# Patient Record
Sex: Male | Born: 1975 | Race: White | Hispanic: No | Marital: Married | Smoking: Never smoker
Health system: Southern US, Community
[De-identification: ages and names within clinical notes are randomized; demographics above are authoritative.]

## PROBLEM LIST (undated history)

## (undated) DIAGNOSIS — N200 Calculus of kidney: Secondary | ICD-10-CM

## (undated) DIAGNOSIS — F419 Anxiety disorder, unspecified: Secondary | ICD-10-CM

## (undated) DIAGNOSIS — G43909 Migraine, unspecified, not intractable, without status migrainosus: Secondary | ICD-10-CM

## (undated) DIAGNOSIS — G8929 Other chronic pain: Secondary | ICD-10-CM

## (undated) DIAGNOSIS — IMO0002 Reserved for concepts with insufficient information to code with codable children: Secondary | ICD-10-CM

## (undated) DIAGNOSIS — K589 Irritable bowel syndrome without diarrhea: Secondary | ICD-10-CM

## (undated) DIAGNOSIS — K219 Gastro-esophageal reflux disease without esophagitis: Secondary | ICD-10-CM

## (undated) DIAGNOSIS — G4730 Sleep apnea, unspecified: Secondary | ICD-10-CM

## (undated) DIAGNOSIS — M542 Cervicalgia: Secondary | ICD-10-CM

## (undated) DIAGNOSIS — M5020 Other cervical disc displacement, unspecified cervical region: Secondary | ICD-10-CM

## (undated) DIAGNOSIS — B029 Zoster without complications: Secondary | ICD-10-CM

## (undated) DIAGNOSIS — T7840XA Allergy, unspecified, initial encounter: Secondary | ICD-10-CM

## (undated) DIAGNOSIS — I10 Essential (primary) hypertension: Secondary | ICD-10-CM

## (undated) HISTORY — PX: WISDOM TOOTH EXTRACTION: SHX21

## (undated) HISTORY — DX: Irritable bowel syndrome, unspecified: K58.9

## (undated) HISTORY — DX: Allergy, unspecified, initial encounter: T78.40XA

## (undated) HISTORY — PX: SPINE SURGERY: SHX786

## (undated) HISTORY — DX: Essential (primary) hypertension: I10

## (undated) HISTORY — DX: Gastro-esophageal reflux disease without esophagitis: K21.9

## (undated) HISTORY — DX: Other chronic pain: G89.29

## (undated) HISTORY — PX: LITHOTRIPSY: SUR834

## (undated) HISTORY — DX: Anxiety disorder, unspecified: F41.9

---

## 1999-11-18 ENCOUNTER — Ambulatory Visit (HOSPITAL_COMMUNITY): Admission: RE | Admit: 1999-11-18 | Discharge: 1999-11-18 | Payer: Self-pay | Admitting: Urology

## 1999-11-18 ENCOUNTER — Encounter: Payer: Self-pay | Admitting: Urology

## 2001-12-10 ENCOUNTER — Encounter: Payer: Self-pay | Admitting: Emergency Medicine

## 2001-12-10 ENCOUNTER — Encounter: Admission: RE | Admit: 2001-12-10 | Discharge: 2001-12-10 | Payer: Self-pay | Admitting: Emergency Medicine

## 2003-07-31 ENCOUNTER — Ambulatory Visit (HOSPITAL_COMMUNITY): Admission: RE | Admit: 2003-07-31 | Discharge: 2003-07-31 | Payer: Self-pay | Admitting: Family Medicine

## 2004-06-20 ENCOUNTER — Inpatient Hospital Stay (HOSPITAL_COMMUNITY): Admission: EM | Admit: 2004-06-20 | Discharge: 2004-06-23 | Payer: Self-pay | Admitting: Emergency Medicine

## 2004-06-25 ENCOUNTER — Ambulatory Visit (HOSPITAL_COMMUNITY): Admission: RE | Admit: 2004-06-25 | Discharge: 2004-06-25 | Payer: Self-pay | Admitting: Urology

## 2004-08-19 ENCOUNTER — Ambulatory Visit (HOSPITAL_COMMUNITY): Admission: RE | Admit: 2004-08-19 | Discharge: 2004-08-19 | Payer: Self-pay | Admitting: Urology

## 2006-02-05 ENCOUNTER — Emergency Department (HOSPITAL_COMMUNITY): Admission: EM | Admit: 2006-02-05 | Discharge: 2006-02-06 | Payer: Self-pay | Admitting: Emergency Medicine

## 2006-02-05 ENCOUNTER — Emergency Department (HOSPITAL_COMMUNITY): Admission: EM | Admit: 2006-02-05 | Discharge: 2006-02-05 | Payer: Self-pay | Admitting: Emergency Medicine

## 2006-06-11 ENCOUNTER — Encounter: Admission: RE | Admit: 2006-06-11 | Discharge: 2006-06-11 | Payer: Self-pay | Admitting: Neurosurgery

## 2006-07-17 ENCOUNTER — Encounter
Admission: RE | Admit: 2006-07-17 | Discharge: 2006-07-17 | Payer: Self-pay | Admitting: Physical Medicine and Rehabilitation

## 2007-06-17 ENCOUNTER — Emergency Department (HOSPITAL_COMMUNITY): Admission: EM | Admit: 2007-06-17 | Discharge: 2007-06-17 | Payer: Self-pay | Admitting: Emergency Medicine

## 2007-10-13 ENCOUNTER — Encounter: Admission: RE | Admit: 2007-10-13 | Discharge: 2007-10-13 | Payer: Self-pay | Admitting: Family Medicine

## 2007-11-14 ENCOUNTER — Encounter: Admission: RE | Admit: 2007-11-14 | Discharge: 2007-11-14 | Payer: Self-pay | Admitting: Neurological Surgery

## 2007-11-19 ENCOUNTER — Emergency Department (HOSPITAL_COMMUNITY): Admission: EM | Admit: 2007-11-19 | Discharge: 2007-11-19 | Payer: Self-pay | Admitting: Emergency Medicine

## 2007-11-20 ENCOUNTER — Ambulatory Visit (HOSPITAL_COMMUNITY): Admission: RE | Admit: 2007-11-20 | Discharge: 2007-11-20 | Payer: Self-pay | Admitting: Emergency Medicine

## 2007-11-20 ENCOUNTER — Encounter (INDEPENDENT_AMBULATORY_CARE_PROVIDER_SITE_OTHER): Payer: Self-pay | Admitting: Emergency Medicine

## 2007-11-20 ENCOUNTER — Ambulatory Visit: Payer: Self-pay | Admitting: Surgery

## 2008-07-24 ENCOUNTER — Encounter: Payer: Self-pay | Admitting: Emergency Medicine

## 2008-07-24 ENCOUNTER — Observation Stay (HOSPITAL_COMMUNITY): Admission: EM | Admit: 2008-07-24 | Discharge: 2008-07-25 | Payer: Self-pay | Admitting: *Deleted

## 2008-08-22 ENCOUNTER — Encounter: Admission: RE | Admit: 2008-08-22 | Discharge: 2008-08-22 | Payer: Self-pay | Admitting: Emergency Medicine

## 2009-06-03 ENCOUNTER — Emergency Department (HOSPITAL_COMMUNITY): Admission: EM | Admit: 2009-06-03 | Discharge: 2009-06-03 | Payer: Self-pay | Admitting: Emergency Medicine

## 2009-06-08 ENCOUNTER — Ambulatory Visit (HOSPITAL_COMMUNITY): Admission: RE | Admit: 2009-06-08 | Discharge: 2009-06-08 | Payer: Self-pay | Admitting: Urology

## 2009-06-18 ENCOUNTER — Emergency Department (HOSPITAL_COMMUNITY): Admission: EM | Admit: 2009-06-18 | Discharge: 2009-06-18 | Payer: Self-pay | Admitting: Emergency Medicine

## 2010-02-08 ENCOUNTER — Encounter: Payer: Self-pay | Admitting: Emergency Medicine

## 2010-02-08 ENCOUNTER — Encounter: Payer: Self-pay | Admitting: Family Medicine

## 2010-03-02 ENCOUNTER — Other Ambulatory Visit (HOSPITAL_COMMUNITY): Payer: Self-pay | Admitting: Family Medicine

## 2010-03-02 DIAGNOSIS — K819 Cholecystitis, unspecified: Secondary | ICD-10-CM

## 2010-03-02 DIAGNOSIS — R509 Fever, unspecified: Secondary | ICD-10-CM

## 2010-03-03 ENCOUNTER — Ambulatory Visit (HOSPITAL_COMMUNITY)
Admission: RE | Admit: 2010-03-03 | Discharge: 2010-03-03 | Disposition: A | Discharge status: Home / Self Care | Payer: BC Managed Care – PPO | Source: Ambulatory Visit | Attending: Family Medicine | Admitting: Family Medicine

## 2010-03-03 DIAGNOSIS — K819 Cholecystitis, unspecified: Secondary | ICD-10-CM

## 2010-03-03 DIAGNOSIS — R109 Unspecified abdominal pain: Secondary | ICD-10-CM | POA: Insufficient documentation

## 2010-03-03 DIAGNOSIS — R112 Nausea with vomiting, unspecified: Secondary | ICD-10-CM | POA: Insufficient documentation

## 2010-03-03 DIAGNOSIS — R509 Fever, unspecified: Secondary | ICD-10-CM | POA: Insufficient documentation

## 2010-03-04 ENCOUNTER — Emergency Department (HOSPITAL_COMMUNITY)
Admission: EM | Admit: 2010-03-04 | Discharge: 2010-03-05 | Disposition: A | Discharge status: Home / Self Care | Payer: BC Managed Care – PPO | Attending: Emergency Medicine | Admitting: Emergency Medicine

## 2010-03-04 ENCOUNTER — Emergency Department (HOSPITAL_COMMUNITY): Payer: BC Managed Care – PPO

## 2010-03-04 ENCOUNTER — Encounter (HOSPITAL_COMMUNITY): Payer: Self-pay | Admitting: Radiology

## 2010-03-04 DIAGNOSIS — R197 Diarrhea, unspecified: Secondary | ICD-10-CM | POA: Insufficient documentation

## 2010-03-04 DIAGNOSIS — R10819 Abdominal tenderness, unspecified site: Secondary | ICD-10-CM | POA: Insufficient documentation

## 2010-03-04 DIAGNOSIS — Z79899 Other long term (current) drug therapy: Secondary | ICD-10-CM | POA: Insufficient documentation

## 2010-03-04 DIAGNOSIS — R319 Hematuria, unspecified: Secondary | ICD-10-CM | POA: Insufficient documentation

## 2010-03-04 DIAGNOSIS — R509 Fever, unspecified: Secondary | ICD-10-CM | POA: Insufficient documentation

## 2010-03-04 DIAGNOSIS — IMO0002 Reserved for concepts with insufficient information to code with codable children: Secondary | ICD-10-CM | POA: Insufficient documentation

## 2010-03-04 DIAGNOSIS — R112 Nausea with vomiting, unspecified: Secondary | ICD-10-CM | POA: Insufficient documentation

## 2010-03-04 DIAGNOSIS — R1013 Epigastric pain: Secondary | ICD-10-CM | POA: Insufficient documentation

## 2010-03-04 HISTORY — DX: Calculus of kidney: N20.0

## 2010-03-04 HISTORY — DX: Reserved for concepts with insufficient information to code with codable children: IMO0002

## 2010-03-04 HISTORY — DX: Zoster without complications: B02.9

## 2010-03-04 LAB — COMPREHENSIVE METABOLIC PANEL
ALT: 36 U/L (ref 0–53)
AST: 37 U/L (ref 0–37)
Albumin: 4.5 g/dL (ref 3.5–5.2)
Alkaline Phosphatase: 87 U/L (ref 39–117)
BUN: 12 mg/dL (ref 6–23)
CO2: 30 mEq/L (ref 19–32)
Calcium: 9.9 mg/dL (ref 8.4–10.5)
Chloride: 99 mEq/L (ref 96–112)
Creatinine, Ser: 0.86 mg/dL (ref 0.4–1.5)
GFR calc Af Amer: >60 mL/min (ref >60)
GFR calc non Af Amer: >60 mL/min (ref >60)
Glucose, Bld: 118 mg/dL — ABNORMAL HIGH (ref 70–99)
Potassium: 4.3 mEq/L (ref 3.5–5.1)
Sodium: 136 mEq/L (ref 135–145)
Total Bilirubin: 0.6 mg/dL (ref 0.3–1.2)
Total Protein: 8.7 g/dL — ABNORMAL HIGH (ref 6.0–8.3)

## 2010-03-04 LAB — CBC
HCT: 41.4 % (ref 39.0–52.0)
Hemoglobin: 14.4 g/dL (ref 13.0–17.0)
MCH: 30.2 pg (ref 26.0–34.0)
MCHC: 34.8 g/dL (ref 30.0–36.0)
MCV: 86.8 fL (ref 78.0–100.0)
Platelets: 278 10*3/uL (ref 150–400)
RBC: 4.77 MIL/uL (ref 4.22–5.81)
RDW: 13.6 % (ref 11.5–15.5)
WBC: 10 10*3/uL (ref 4.0–10.5)

## 2010-03-04 LAB — URINALYSIS, ROUTINE W REFLEX MICROSCOPIC
Bilirubin Urine: NEGATIVE
Hgb urine dipstick: NEGATIVE
Ketones, ur: NEGATIVE mg/dL
Nitrite: NEGATIVE
Protein, ur: NEGATIVE mg/dL
Specific Gravity, Urine: 1.024 (ref 1.005–1.030)
Urine Glucose, Fasting: NEGATIVE mg/dL
Urobilinogen, UA: 0.2 mg/dL (ref 0.0–1.0)
pH: 8 (ref 5.0–8.0)

## 2010-03-04 LAB — DIFFERENTIAL
Basophils Absolute: 0 10*3/uL (ref 0.0–0.1)
Basophils Relative: 0 % (ref 0–1)
Eosinophils Absolute: 0.1 10*3/uL (ref 0.0–0.7)
Eosinophils Relative: 1 % (ref 0–5)
Lymphocytes Relative: 13 % (ref 12–46)
Lymphs Abs: 1.3 10*3/uL (ref 0.7–4.0)
Monocytes Absolute: 0.4 10*3/uL (ref 0.1–1.0)
Monocytes Relative: 4 % (ref 3–12)
Neutro Abs: 8.2 10*3/uL — ABNORMAL HIGH (ref 1.7–7.7)
Neutrophils Relative %: 82 % — ABNORMAL HIGH (ref 43–77)

## 2010-03-04 LAB — URINE MICROSCOPIC-ADD ON

## 2010-03-04 MED ORDER — IOHEXOL 300 MG/ML  SOLN
100.0000 mL | Freq: Once | INTRAMUSCULAR | Status: AC | PRN
  Administered 2010-03-04: 100 mL via INTRAVENOUS

## 2010-03-05 LAB — OCCULT BLOOD, POC DEVICE: Fecal Occult Bld: NEGATIVE

## 2010-04-05 LAB — URINALYSIS, ROUTINE W REFLEX MICROSCOPIC
Bilirubin Urine: NEGATIVE
Glucose, UA: NEGATIVE mg/dL
Ketones, ur: NEGATIVE mg/dL
Leukocytes, UA: NEGATIVE
Nitrite: NEGATIVE
Protein, ur: NEGATIVE mg/dL
Specific Gravity, Urine: 1.013 (ref 1.005–1.030)
Urobilinogen, UA: 0.2 mg/dL (ref 0.0–1.0)
pH: 6 (ref 5.0–8.0)

## 2010-04-05 LAB — CBC
HCT: 41.5 % (ref 39.0–52.0)
Hemoglobin: 14.4 g/dL (ref 13.0–17.0)
MCHC: 34.8 g/dL (ref 30.0–36.0)
MCV: 89.3 fL (ref 78.0–100.0)
Platelets: 289 10*3/uL (ref 150–400)
RBC: 4.65 MIL/uL (ref 4.22–5.81)
RDW: 13.1 % (ref 11.5–15.5)
WBC: 10.1 10*3/uL (ref 4.0–10.5)

## 2010-04-05 LAB — DIFFERENTIAL
Basophils Absolute: 0 10*3/uL (ref 0.0–0.1)
Basophils Relative: 0 % (ref 0–1)
Eosinophils Absolute: 0 10*3/uL (ref 0.0–0.7)
Eosinophils Relative: 0 % (ref 0–5)
Lymphocytes Relative: 15 % (ref 12–46)
Lymphs Abs: 1.5 10*3/uL (ref 0.7–4.0)
Monocytes Absolute: 0.5 10*3/uL (ref 0.1–1.0)
Monocytes Relative: 5 % (ref 3–12)
Neutro Abs: 8.1 10*3/uL — ABNORMAL HIGH (ref 1.7–7.7)
Neutrophils Relative %: 80 % — ABNORMAL HIGH (ref 43–77)

## 2010-04-05 LAB — POCT I-STAT, CHEM 8
BUN: 17 mg/dL (ref 6–23)
Calcium, Ion: 1.22 mmol/L (ref 1.12–1.32)
Chloride: 106 mEq/L (ref 96–112)
Creatinine, Ser: 0.7 mg/dL (ref 0.4–1.5)
Glucose, Bld: 101 mg/dL — ABNORMAL HIGH (ref 70–99)
HCT: 43 % (ref 39.0–52.0)
Hemoglobin: 14.6 g/dL (ref 13.0–17.0)
Potassium: 3.8 mEq/L (ref 3.5–5.1)
Sodium: 139 mEq/L (ref 135–145)
TCO2: 26 mmol/L (ref 0–100)

## 2010-04-05 LAB — URINE MICROSCOPIC-ADD ON

## 2010-04-25 LAB — COMPREHENSIVE METABOLIC PANEL
ALT: 23 U/L (ref 0–53)
ALT: 26 U/L (ref 0–53)
AST: 29 U/L (ref 0–37)
AST: 27 U/L (ref 0–37)
Albumin: 4 g/dL (ref 3.5–5.2)
Albumin: 4.3 g/dL (ref 3.5–5.2)
Alkaline Phosphatase: 84 U/L (ref 39–117)
Alkaline Phosphatase: 84 U/L (ref 39–117)
BUN: 12 mg/dL (ref 6–23)
BUN: 13 mg/dL (ref 6–23)
CO2: 25 mEq/L (ref 19–32)
CO2: 27 mEq/L (ref 19–32)
Calcium: 9.3 mg/dL (ref 8.4–10.5)
Calcium: 10.1 mg/dL (ref 8.4–10.5)
Chloride: 106 mEq/L (ref 96–112)
Chloride: 106 mEq/L (ref 96–112)
Creatinine, Ser: 0.94 mg/dL (ref 0.4–1.5)
Creatinine, Ser: 0.93 mg/dL (ref 0.4–1.5)
GFR calc Af Amer: >60 mL/min (ref >60)
GFR calc Af Amer: >60 mL/min (ref >60)
GFR calc non Af Amer: >60 mL/min (ref >60)
GFR calc non Af Amer: >60 mL/min (ref >60)
Glucose, Bld: 102 mg/dL — ABNORMAL HIGH (ref 70–99)
Glucose, Bld: 97 mg/dL (ref 70–99)
Potassium: 3.5 mEq/L (ref 3.5–5.1)
Potassium: 3.5 mEq/L (ref 3.5–5.1)
Sodium: 140 mEq/L (ref 135–145)
Sodium: 138 mEq/L (ref 135–145)
Total Bilirubin: 0.7 mg/dL (ref 0.3–1.2)
Total Bilirubin: 0.3 mg/dL (ref 0.3–1.2)
Total Protein: 7.4 g/dL (ref 6.0–8.3)
Total Protein: 7.4 g/dL (ref 6.0–8.3)

## 2010-04-25 LAB — DIFFERENTIAL
Basophils Absolute: 0 10*3/uL (ref 0.0–0.1)
Basophils Absolute: 0 10*3/uL (ref 0.0–0.1)
Basophils Relative: 0 % (ref 0–1)
Basophils Relative: 1 % (ref 0–1)
Eosinophils Absolute: 0 10*3/uL (ref 0.0–0.7)
Eosinophils Absolute: 0.1 10*3/uL (ref 0.0–0.7)
Eosinophils Relative: 1 % (ref 0–5)
Eosinophils Relative: 2 % (ref 0–5)
Lymphocytes Relative: 32 % (ref 12–46)
Lymphocytes Relative: 36 % (ref 12–46)
Lymphs Abs: 1.8 10*3/uL (ref 0.7–4.0)
Lymphs Abs: 2.9 10*3/uL (ref 0.7–4.0)
Monocytes Absolute: 0.4 10*3/uL (ref 0.1–1.0)
Monocytes Absolute: 0.7 10*3/uL (ref 0.1–1.0)
Monocytes Relative: 8 % (ref 3–12)
Monocytes Relative: 9 % (ref 3–12)
Neutro Abs: 3.4 10*3/uL (ref 1.7–7.7)
Neutro Abs: 4.2 10*3/uL (ref 1.7–7.7)
Neutrophils Relative %: 59 % (ref 43–77)
Neutrophils Relative %: 53 % (ref 43–77)

## 2010-04-25 LAB — CARDIAC PANEL(CRET KIN+CKTOT+MB+TROPI)
CK, MB: 0.8 ng/mL (ref 0.3–4.0)
CK, MB: 0.9 ng/mL (ref 0.3–4.0)
Relative Index: INVALID (ref 0.0–2.5)
Relative Index: INVALID (ref 0.0–2.5)
Total CK: 64 U/L (ref 7–232)
Total CK: 68 U/L (ref 7–232)
Troponin I: 0.01 ng/mL (ref 0.00–0.06)
Troponin I: 0.02 ng/mL (ref 0.00–0.06)

## 2010-04-25 LAB — CBC
HCT: 37.7 % — ABNORMAL LOW (ref 39.0–52.0)
HCT: 41.3 % (ref 39.0–52.0)
Hemoglobin: 13.3 g/dL (ref 13.0–17.0)
Hemoglobin: 15 g/dL (ref 13.0–17.0)
MCHC: 35.1 g/dL (ref 30.0–36.0)
MCHC: 36.3 g/dL — ABNORMAL HIGH (ref 30.0–36.0)
MCV: 88.1 fL (ref 78.0–100.0)
MCV: 87.2 fL (ref 78.0–100.0)
Platelets: 231 10*3/uL (ref 150–400)
Platelets: 276 10*3/uL (ref 150–400)
RBC: 4.28 MIL/uL (ref 4.22–5.81)
RBC: 4.74 MIL/uL (ref 4.22–5.81)
RDW: 12.5 % (ref 11.5–15.5)
RDW: 12.1 % (ref 11.5–15.5)
WBC: 5.8 10*3/uL (ref 4.0–10.5)
WBC: 7.9 10*3/uL (ref 4.0–10.5)

## 2010-04-25 LAB — BASIC METABOLIC PANEL
BUN: 11 mg/dL (ref 6–23)
CO2: 24 mEq/L (ref 19–32)
Calcium: 8.6 mg/dL (ref 8.4–10.5)
Chloride: 106 mEq/L (ref 96–112)
Creatinine, Ser: 0.84 mg/dL (ref 0.4–1.5)
GFR calc Af Amer: >60 mL/min (ref >60)
GFR calc non Af Amer: >60 mL/min (ref >60)
Glucose, Bld: 99 mg/dL (ref 70–99)
Potassium: 3.7 mEq/L (ref 3.5–5.1)
Sodium: 136 mEq/L (ref 135–145)

## 2010-04-25 LAB — LIPID PANEL
Cholesterol: 158 mg/dL (ref 0–200)
HDL: 27 mg/dL — ABNORMAL LOW (ref >39)
LDL Cholesterol: 70 mg/dL (ref 0–99)
Total CHOL/HDL Ratio: 5.9 RATIO
Triglycerides: 306 mg/dL — ABNORMAL HIGH (ref <150)
VLDL: 61 mg/dL — ABNORMAL HIGH (ref 0–40)

## 2010-04-25 LAB — POCT CARDIAC MARKERS
CKMB, poc: <1 ng/mL — ABNORMAL LOW (ref 1.0–8.0)
CKMB, poc: <1 ng/mL — ABNORMAL LOW (ref 1.0–8.0)
Myoglobin, poc: 37.7 ng/mL (ref 12–200)
Myoglobin, poc: 60.9 ng/mL (ref 12–200)
Troponin i, poc: <0.05 ng/mL (ref 0.00–0.09)
Troponin i, poc: <0.05 ng/mL (ref 0.00–0.09)

## 2010-04-25 LAB — LIPASE, BLOOD: Lipase: 18 U/L (ref 11–59)

## 2010-04-25 LAB — RAPID URINE DRUG SCREEN, HOSP PERFORMED
Amphetamines: NOT DETECTED
Barbiturates: NOT DETECTED
Benzodiazepines: NOT DETECTED
Cocaine: NOT DETECTED
Opiates: POSITIVE — AB
Tetrahydrocannabinol: NOT DETECTED

## 2010-04-25 LAB — HEPATIC FUNCTION PANEL
ALT: 28 U/L (ref 0–53)
AST: 25 U/L (ref 0–37)
Albumin: 3.6 g/dL (ref 3.5–5.2)
Alkaline Phosphatase: 69 U/L (ref 39–117)
Bilirubin, Direct: <0.1 mg/dL (ref 0.0–0.3)
Total Bilirubin: 0.7 mg/dL (ref 0.3–1.2)
Total Protein: 6.5 g/dL (ref 6.0–8.3)

## 2010-04-25 LAB — TROPONIN I: Troponin I: 0.01 ng/mL (ref 0.00–0.06)

## 2010-04-25 LAB — PROTIME-INR
INR: 1 (ref 0.00–1.49)
INR: 1 (ref 0.00–1.49)
Prothrombin Time: 13.6 seconds (ref 11.6–15.2)
Prothrombin Time: 12.9 seconds (ref 11.6–15.2)

## 2010-04-25 LAB — HEMOCCULT GUIAC POC 1CARD (OFFICE): Fecal Occult Bld: NEGATIVE

## 2010-04-25 LAB — CK TOTAL AND CKMB (NOT AT ARMC)
CK, MB: 0.8 ng/mL (ref 0.3–4.0)
Relative Index: INVALID (ref 0.0–2.5)
Total CK: 61 U/L (ref 7–232)

## 2010-04-25 LAB — APTT: aPTT: 29 seconds (ref 24–37)

## 2010-06-01 NOTE — H&P (Signed)
[name] NO.:  [id number]   MEDICAL RECORD NO.:  [id number]          PATIENT TYPE:  INP   LOCATION:  1824                         FACILITY:  [facility]   PHYSICIAN:  Hollice Espy, M.D.DATE OF BIRTH:  September 20, 1975   DATE OF ADMISSION:  07/24/2008  DATE OF DISCHARGE:                              HISTORY & PHYSICAL   __________   PRIMARY CARE PHYSICIAN:  [facility], [place].   CHIEF COMPLAINT:  Chest/epigastric discomfort.   HISTORY OF PRESENT ILLNESS:  The patient is a 35 year old Caucasian male  with no significant past medical history.  The patient was resting at  home and experienced the sudden onset of epigastric/chest discomfort at  11:30 p.m.  The patient thought it was indigestion and upon his wife's  recommendations he took 3 pills of antacids but with no relief.  He had  the pain for about 2 hours with no relief and so he went to the [facility].  He describes the pain to be as a pressure-like sensation  with no radiation.  It was 8/10 in intensity.  There are no specific  aggravating or alleviating factors.  The pain was associated with  shortness of breath and he had one episode of emesis which was non-  bloody.  The pain is not pleuritic in nature and its is reproducible  with palpation in the epigastric region.  He had a similar episode 1-  week ago after sexual intercourse.  He reports no decrease in exercise  tolerance lately.  He denies having any PND or orthopnea.  There is no  fever or chills.  He denies having any cough.  There is no flu-like  prodrome in the last 2 weeks.  He has not traveled long distance.   As part of the workup, he had an EKG done at [facility] and the  ER physician was concerned that he had ST elevation in the inferior  leads and, therefore a code STEMI was called and the patient was  transferred to [facility] for further management.  The ER physician  reviewed the EKG at [facility] and it was not consistent with  an ST elevation myocardial infarction.  A repeat EKG was done again at  the [facility] and it did not reveal any ST elevation.  A set  of cardiac enzymes were obtained at [facility] and these were  within normal limits.  The patient is being evaluated for cardiac  ischemia with a stress test.   REVIEW OF SYSTEMS:  A complete review of systems was done which included  general, head, eyes, ears, nose, throat, cardiovascular, respiratory,  GI, GU, endocrine, musculoskeletal, skin, neurologic and psychiatric and  all are within normal limits other than what is mentioned above.   PAST MEDICAL HISTORY:  1. Migraine headaches.  2. Renal stones.  3. Shingles.   ALLERGIES:  ERYTHROMYCIN TO WHICH HE IS INTOLERANT.   MEDICATIONS AT HOME:  1. Topamax 100 mg p.o. nightly.  2. Reglan p.r.n. for nausea.   SOCIAL HISTORY:  He works at [employer].  He lives with his  spouse.  There is no history of tobacco abuse, alcohol or illicit drug  use.   FAMILY HISTORY:  Hypertension.   PHYSICAL EXAMINATION:  VITAL SIGNS:  Blood pressure 111/66, heart rate  86, respirations 22, O2 saturations 98% on room air.  GENERAL APPEARANCE:  Not in acute distress.  Alert, awake and oriented  x3.  Afebrile.  HEENT:  Normocephalic, atraumatic.  Pupils are equal and reactive to  light and accommodation.  Extraocular movements are intact.  Mucous  membranes are moist.  NECK:  Supple.  No JVD, lymphadenopathy or carotid bruits.  CARDIOVASCULAR:  Regular rhythm, rate is normal.  No murmurs, rubs or  gallops.  LUNGS:  Clear to auscultation bilaterally.  ABDOMEN:  Soft.  There is mild tenderness in the epigastric region, but  there is no rebound or guarding.  There is no hepatosplenomegaly or  palpable masses.  No ascites.  EXTREMITIES:  No clubbing, cyanosis or edema.  NEUROLOGIC:  Grossly nonfocal.   LABS AND STUDIES:  WBC 7900, hemoglobin 15,  hematocrit 41.3.  Platelets  276.  INR 1.0, protime 12.9, PTT 29.  Sodium 140, potassium 3.5,  chloride 106, bicarb 25, BUN 12, creatinine 0.9, blood glucose 102.  CK-  MB less than 1, troponin less than 0.05, CK 37.7.  Fecal occult blood  test negative.   ASSESSMENT/PLAN:  1. Chest pain.  Very atypical for cardiac ischemia.  The patient has      no risk factors.  The patient had a similar episode after physical      exertion in the past.  There are nonspecific EKG changes.  The EKG      reveals a normal sinus rhythm at a rate of 80 beats per minute.      The axis is normal.  The PR and QT intervals are within normal      limits.  Nonspecific ST segment changes in lead 2 and AVF.  There      is T-wave inversion in lead V1.  There is, however, no old EKG to      compare.  A chest x-ray obtained at [facility] revealed no      acute abnormalities.  There was no mediastinal widening noted.  2. Acute abdominal series noted minimal bronchitic changes but no      acute abnormal findings.  Very unlikely that is due to cardiac      ischemia.  Given the presentation after physical exertion and      atypical EKG findings, we will evaluate with a cardiac stress test.      If the stress test is positive, we will request a cardiology      consult for a possible left heart catheterization.  We will be      cycling cardiac enzymes and repeat an EKG later in the day.  The      patient will be started on modified ACS protocol.  He was started      on a heparin drip at [facility], which was stopped.  He      was also given a loading dose of __________600 mg p.o.  He also      received aspirin 325 mg p.o.  3. The lipase was within normal limits.  It does not like pancreatic      pathology.  A guaiac was done which was negative.  We will consider  a right upper quadrant ultrasound to evaluate for gallbladder      pathology if symptoms persist.  4. Deep venous thrombosis prophylaxis  with heparin.  5. Fluid/Electrolyte/Nutrition.  We will replace his electrolytes as      needed.  We will place __________.  He will      be made n.p.o. until after the stress test.  6. This patient will be admitted to the cardiac floor with telemetry.      He will be discharged home if the stress test is within normal      limits.      Hollice Espy, M.D.  Electronically Signed     SKK/MEDQ  D:  07/24/2008  T:  07/24/2008  Job:  [id number]

## 2010-06-04 NOTE — H&P (Signed)
[name], [name]                ACCOUNT NO.:  [id number]   MEDICAL RECORD NO.:  [id number]          PATIENT TYPE:  INP   LOCATION:  A325                          FACILITY:  [facility]   PHYSICIAN:  Dennie Maizes, M.D.   DATE OF BIRTH:  [birth year]   DATE OF ADMISSION:  06/20/2004  DATE OF DISCHARGE:  LH                                HISTORY & PHYSICAL   CHIEF COMPLAINT:  Severe flank pain radiating to the front, nausea and  vomiting for four days.   HISTORY OF PRESENT ILLNESS:  This 35 year old male has a past history of  recurrent urolithiasis.  He complains of having sudden onset of severe flank  pain radiating to the front for the past four days.  He has nausea and  vomiting.  He has been evaluated by Dr. Daneil Dolin of the KUB area  revealed a small left upper ureteral calculus.  The patient's pain was not  adequately controlled with p.o. analgesics.  He came to the emergency room  at [facility] for further evaluation.  CT scan of the abdomen and  pelvis without contrast was done.  This revealed a 4 mm size left upper  ureteral calculus with obstruction and left hydronephrosis.  The patient's  pain was not adequately controlled in the emergency room.  He has been  admitted to the hospital for pain control and further treatment.   The patient did not have any fevers, chills, swallowing difficulty or gross  hematuria.   PAST MEDICAL HISTORY:  1.  History of recurrent urolithiasis, status post ESWL of renal calculus,      2001.  2.  History of migraine headaches.   CURRENT MEDICATIONS:  1.  Imipramine 25 mg p.o. q.h.s.  2.  Imitrex 100 mg p.o. p.r.n. for migraine headaches.  3.  Metoclopramide 10 mg p.o. t.i.d.   ALLERGIES:  ERYTHROMYCIN.   PHYSICAL EXAMINATION:  GENERAL:  The patient is comfortable after receiving  parenteral narcotics.  HEENT:  Normal.  NECK:  No masses.  LUNGS:  Clear to auscultation.  HEART:  Regular rate and rhythm.  No murmurs.  ABDOMEN:   Soft.  No palpable flank mass.  Moderate left costovertebral  tenderness was noted.  No suprapubic tenderness or bladder distention.  GENITALIA:  Penis and testes are normal.   ADMISSION LABORATORIES:  Urinalysis:  Blood moderate, nitrites negative,  leukocyte esterase negative, WBCs 3-6 per high power field, RBCs 11-20 per  high power field.  CBC:  WBC 8.9, hemoglobin 14.8, hematocrit 41.3.  Renal  function is normal.  BUN 11, creatinine 0.8.  Electrolytes within normal  range.   IMPRESSION:  1.  Left upper ureteral calculus with obstruction.  2.  Left renal colic.  3.  Left hydronephrosis.   PLAN:  1.  Admit the patient to the hospital.  2.  Parenteral narcotics.  3.  IV fluids.  4.  Strain all urine for stones.  5.  Discussed with the patient about management options.       SK/MEDQ  D:  06/21/2004  T:  06/21/2004  Job:  [id number] 

## 2010-06-04 NOTE — Discharge Summary (Signed)
[name], WALKUP                ACCOUNT NO.:  [id number]   MEDICAL RECORD NO.:  [id number]          PATIENT TYPE:  INP   LOCATION:  A325                          FACILITY:  [facility]   PHYSICIAN:  Dennie Maizes, M.D.   DATE OF BIRTH:  1975-05-10   DATE OF ADMISSION:  06/20/2004  DATE OF DISCHARGE:  06/07/2006LH                                 DISCHARGE SUMMARY   FINAL DIAGNOSES:  1.  Left upper ureteral calculus with obstruction.  2.  Left renal colic.  3.  Left hydronephrosis.   OPERATIVE PROCEDURE:  Extracorporeal shock wave lithotripsy of the left  ureteral calculus, done on June 23, 2004.   COMPLICATIONS:  None.   DISCHARGE SUMMARY:  This 35 year old male has a past history of recurrent  ureterolithiasis.  He experienced sudden onset of severe left flank pain  radiating to the front for four days.  He had nausea and vomiting associated  with the pain.  He was evaluated by Dr. Etta Grandchild at [facility].  X-ray with a  KUB revealed a small left upper ureteral calculus.  The pain was not  completely controlled with p.o. analgesics.  He came to the emergency room  at [facility] for further evaluation.  A noncontrast CT scan of the  abdomen and pelvis revealed a 4 mm sized left upper ureteral calculus with  obstruction and left hydronephrosis.  The patient's pain was not completely  controlled in the emergency room.  He was admitted to the hospital for pain  control and further treatment.  The patient denied having any fever, chills,  voiding difficulty, or gross hematuria.   PAST MEDICAL HISTORY:  1.  Recurrent ureterolithiasis, status post ESWL of the left renal calculus      in 2001.  2.  History of migraine headaches.   MEDICATIONS:  1.  Imipramine 25 mg 1 p.o. q.h.s.  2.  Imitrex 100 mg p.o. p.r.n. migraine headaches.  3.  Metoclopramide 10 mg p.o. t.i.d.   ALLERGIES:  ERYTHROMYCIN.   PHYSICAL EXAMINATION:  GENERAL:  Patient was comfortable while receiving  parenteral narcotics.  HEENT:  Normal.  NECK:  No masses.  LUNGS:  Clear to auscultation.  HEART:  Regular rate and rhythm with no murmurs.  ABDOMEN:  Soft.  No palpable frank mass.  Moderate left costovertebral angle  tenderness was noted.  No suprapubic tenderness or bladder distention.  GENITOURINARY:  Penis and testes are normal.   ADMISSION LABS:  Urinalysis:  Blood moderate.  Nitrites negative.  Leukocyte  esterase negative.  WBCs 3-6 per high-powered field.  RBCs 11-20 per high-  powered field.  CBC:  WBC 8.9, hemoglobin 14.8, hematocrit 41.3.  Renal  functions normal with BUN of 11, creatinine 0.8.  Electrolytes within normal  range.   HOSPITAL COURSE:  The patient was admitted to the hospital and treated with  IV fluids and parenteral narcotics.  He was unable to pass the stone, and he  had severe, persistent pain.  I discussed with the patient about management  options.  He wanted to wait for about 24-48 hours.  An  x-ray with a KUB was  done on June 22, 2004.  His left upper ureteral calculus was noted.  The  patient decided to undergo ESWL of the left upper ureteral calculus, which  was done with IV sedation on June 23, 2004.  Postoperatively, the patient had  good pain relief.  He was voiding well.  He was discharged and sent home on  June 23, 2004.  His discharge medications were Cipro 500 mg 1 p.o. b.i.d. x7  days and Tylenol w/Codeine #3 1 p.o. q.8h. p.r.n. pain #20.  He will be  reviewed in the office on June 25, 2004, at which time a post-ESWL KUB will  be done.  Patient is advised to call me for any fever, chills, voiding  difficulty, or gross hematuria.   The condition of the patient at the time of discharge was stable.       SK/MEDQ  D:  07/16/2004  T:  07/16/2004  Job:  [id number]

## 2010-07-18 ENCOUNTER — Emergency Department (HOSPITAL_COMMUNITY): Payer: 59

## 2010-07-18 ENCOUNTER — Inpatient Hospital Stay (HOSPITAL_COMMUNITY)
Admission: EM | Admit: 2010-07-18 | Discharge: 2010-07-21 | DRG: 392 | Disposition: A | Discharge status: Home / Self Care | Payer: 59 | Attending: General Surgery | Admitting: General Surgery

## 2010-07-18 DIAGNOSIS — R1031 Right lower quadrant pain: Principal | ICD-10-CM | POA: Diagnosis present

## 2010-07-18 DIAGNOSIS — Z87442 Personal history of urinary calculi: Secondary | ICD-10-CM

## 2010-07-18 DIAGNOSIS — G43909 Migraine, unspecified, not intractable, without status migrainosus: Secondary | ICD-10-CM | POA: Diagnosis present

## 2010-07-18 DIAGNOSIS — M51379 Other intervertebral disc degeneration, lumbosacral region without mention of lumbar back pain or lower extremity pain: Secondary | ICD-10-CM | POA: Diagnosis present

## 2010-07-18 DIAGNOSIS — M5137 Other intervertebral disc degeneration, lumbosacral region: Secondary | ICD-10-CM | POA: Diagnosis present

## 2010-07-18 LAB — COMPREHENSIVE METABOLIC PANEL
ALT: 26 U/L (ref 0–53)
AST: 21 U/L (ref 0–37)
Albumin: 4.1 g/dL (ref 3.5–5.2)
Alkaline Phosphatase: 95 U/L (ref 39–117)
BUN: 21 mg/dL (ref 6–23)
CO2: 27 mEq/L (ref 19–32)
Calcium: 10 mg/dL (ref 8.4–10.5)
Chloride: 98 mEq/L (ref 96–112)
Creatinine, Ser: 0.88 mg/dL (ref 0.50–1.35)
GFR calc Af Amer: >60 mL/min (ref >60)
GFR calc non Af Amer: >60 mL/min (ref >60)
Glucose, Bld: 93 mg/dL (ref 70–99)
Potassium: 3.8 mEq/L (ref 3.5–5.1)
Sodium: 137 mEq/L (ref 135–145)
Total Bilirubin: 0.6 mg/dL (ref 0.3–1.2)
Total Protein: 8.3 g/dL (ref 6.0–8.3)

## 2010-07-18 LAB — DIFFERENTIAL
Basophils Absolute: 0 10*3/uL (ref 0.0–0.1)
Basophils Relative: 0 % (ref 0–1)
Eosinophils Absolute: 0.1 10*3/uL (ref 0.0–0.7)
Eosinophils Relative: 1 % (ref 0–5)
Lymphocytes Relative: 20 % (ref 12–46)
Lymphs Abs: 1.6 10*3/uL (ref 0.7–4.0)
Monocytes Absolute: 0.4 10*3/uL (ref 0.1–1.0)
Monocytes Relative: 5 % (ref 3–12)
Neutro Abs: 5.8 10*3/uL (ref 1.7–7.7)
Neutrophils Relative %: 74 % (ref 43–77)

## 2010-07-18 LAB — URINALYSIS, ROUTINE W REFLEX MICROSCOPIC
Bilirubin Urine: NEGATIVE
Glucose, UA: NEGATIVE mg/dL
Hgb urine dipstick: NEGATIVE
Ketones, ur: NEGATIVE mg/dL
Leukocytes, UA: NEGATIVE
Nitrite: NEGATIVE
Protein, ur: NEGATIVE mg/dL
Specific Gravity, Urine: 1.01 (ref 1.005–1.030)
Urobilinogen, UA: 0.2 mg/dL (ref 0.0–1.0)
pH: 7 (ref 5.0–8.0)

## 2010-07-18 LAB — CBC
HCT: 45.1 % (ref 39.0–52.0)
Hemoglobin: 15.1 g/dL (ref 13.0–17.0)
MCH: 29.3 pg (ref 26.0–34.0)
MCHC: 33.5 g/dL (ref 30.0–36.0)
MCV: 87.6 fL (ref 78.0–100.0)
Platelets: 266 10*3/uL (ref 150–400)
RBC: 5.15 MIL/uL (ref 4.22–5.81)
RDW: 13.2 % (ref 11.5–15.5)
WBC: 7.9 10*3/uL (ref 4.0–10.5)

## 2010-07-18 MED ORDER — IOHEXOL 300 MG/ML  SOLN
100.0000 mL | Freq: Once | INTRAMUSCULAR | Status: AC | PRN
  Administered 2010-07-18: 100 mL via INTRAVENOUS

## 2010-07-19 ENCOUNTER — Observation Stay (HOSPITAL_COMMUNITY): Payer: 59

## 2010-07-19 LAB — CBC
HCT: 43.4 % (ref 39.0–52.0)
Hemoglobin: 14.6 g/dL (ref 13.0–17.0)
MCH: 29.8 pg (ref 26.0–34.0)
MCHC: 33.6 g/dL (ref 30.0–36.0)
MCV: 88.6 fL (ref 78.0–100.0)
Platelets: 246 10*3/uL (ref 150–400)
RBC: 4.9 MIL/uL (ref 4.22–5.81)
RDW: 13.3 % (ref 11.5–15.5)
WBC: 7.6 10*3/uL (ref 4.0–10.5)

## 2010-07-19 LAB — BASIC METABOLIC PANEL
BUN: 15 mg/dL (ref 6–23)
CO2: 30 mEq/L (ref 19–32)
Calcium: 10.1 mg/dL (ref 8.4–10.5)
Chloride: 97 mEq/L (ref 96–112)
Creatinine, Ser: 0.72 mg/dL (ref 0.50–1.35)
GFR calc Af Amer: >60 mL/min (ref >60)
GFR calc non Af Amer: >60 mL/min (ref >60)
Glucose, Bld: 97 mg/dL (ref 70–99)
Potassium: 3.8 mEq/L (ref 3.5–5.1)
Sodium: 135 mEq/L (ref 135–145)

## 2010-07-19 LAB — DIFFERENTIAL
Basophils Absolute: 0 10*3/uL (ref 0.0–0.1)
Basophils Relative: 0 % (ref 0–1)
Eosinophils Absolute: 0 10*3/uL (ref 0.0–0.7)
Eosinophils Relative: 0 % (ref 0–5)
Lymphocytes Relative: 20 % (ref 12–46)
Lymphs Abs: 1.6 10*3/uL (ref 0.7–4.0)
Monocytes Absolute: 0.4 10*3/uL (ref 0.1–1.0)
Monocytes Relative: 5 % (ref 3–12)
Neutro Abs: 5.6 10*3/uL (ref 1.7–7.7)
Neutrophils Relative %: 74 % (ref 43–77)

## 2010-07-19 MED ORDER — SODIUM PERTECHNETATE TC 99M INJECTION
10.0000 | Freq: Once | INTRAVENOUS | Status: AC | PRN
  Administered 2010-07-19: 10 via INTRAVENOUS

## 2010-07-20 LAB — BASIC METABOLIC PANEL
BUN: 11 mg/dL (ref 6–23)
CO2: 31 mEq/L (ref 19–32)
Calcium: 9.4 mg/dL (ref 8.4–10.5)
Chloride: 98 mEq/L (ref 96–112)
Creatinine, Ser: 0.79 mg/dL (ref 0.50–1.35)
GFR calc Af Amer: >60 mL/min (ref >60)
GFR calc non Af Amer: >60 mL/min (ref >60)
Glucose, Bld: 106 mg/dL — ABNORMAL HIGH (ref 70–99)
Potassium: 3.7 mEq/L (ref 3.5–5.1)
Sodium: 137 mEq/L (ref 135–145)

## 2010-07-20 LAB — CBC
HCT: 44.9 % (ref 39.0–52.0)
Hemoglobin: 15 g/dL (ref 13.0–17.0)
MCH: 29.2 pg (ref 26.0–34.0)
MCHC: 33.4 g/dL (ref 30.0–36.0)
MCV: 87.4 fL (ref 78.0–100.0)
Platelets: 251 10*3/uL (ref 150–400)
RBC: 5.14 MIL/uL (ref 4.22–5.81)
RDW: 13.2 % (ref 11.5–15.5)
WBC: 6.4 10*3/uL (ref 4.0–10.5)

## 2010-07-20 LAB — DIFFERENTIAL
Basophils Absolute: 0 10*3/uL (ref 0.0–0.1)
Basophils Relative: 0 % (ref 0–1)
Eosinophils Absolute: 0.1 10*3/uL (ref 0.0–0.7)
Eosinophils Relative: 1 % (ref 0–5)
Lymphocytes Relative: 22 % (ref 12–46)
Lymphs Abs: 1.4 10*3/uL (ref 0.7–4.0)
Monocytes Absolute: 0.4 10*3/uL (ref 0.1–1.0)
Monocytes Relative: 6 % (ref 3–12)
Neutro Abs: 4.6 10*3/uL (ref 1.7–7.7)
Neutrophils Relative %: 71 % (ref 43–77)

## 2010-07-21 LAB — URINALYSIS, ROUTINE W REFLEX MICROSCOPIC
Bilirubin Urine: NEGATIVE
Glucose, UA: NEGATIVE mg/dL
Hgb urine dipstick: NEGATIVE
Ketones, ur: NEGATIVE mg/dL
Leukocytes, UA: NEGATIVE
Nitrite: NEGATIVE
Protein, ur: NEGATIVE mg/dL
Specific Gravity, Urine: 1.01 (ref 1.005–1.030)
Urobilinogen, UA: 0.2 mg/dL (ref 0.0–1.0)
pH: 7.5 (ref 5.0–8.0)

## 2010-08-04 NOTE — H&P (Signed)
NAMEWILLIAM, [name] NO.:  [id number]  MEDICAL RECORD NO.:  [id number]  LOCATION:  A316                          FACILITY:  [facility]  PHYSICIAN:  Tilford Pillar, MD      DATE OF BIRTH:  Sep 21, 1975  DATE OF ADMISSION:  07/18/2010 DATE OF DISCHARGE:  07/04/2012LH                             HISTORY & PHYSICAL   CHIEF COMPLAINT:  Right lower quadrant abdominal pain.  HISTORY OF PRESENT ILLNESS:  The patient is a 35 year old male relatively healthy, who presented to [facility] with several day history of increasing right lower quadrant abdominal pain and back pain.  He denied any dysuria, no fevers or chills, has had some associated nausea, but no emesis.  He has noted no change in bowel movements.  No melena.  No hematochezia.  Again, not noticed any dysuria.  No hematuria.  Although he has had a previous history of renal calculi.  He has had similar symptomatology in the past, but never this severe.  No recent unusual travel.  No sick contacts.  PAST MEDICAL HISTORY:  Degenerative disk disease, renal calculi, migraine headaches, and shingles.  SURGICAL HISTORY:  Prior lithotripsy.  MEDICATIONS:  Propanol and imipramine.  ALLERGIES:  ERYTHROMYCIN BASE causes rash.  SOCIAL HISTORY:  No tobacco, no alcohol, no recreational drug abuse.  FAMILY HISTORY:  Noncontributory.  REVIEW OF SYSTEMS:  CONSTITUTIONAL:  Unremarkable.  EYES:  Unremarkable. EARS, NOSE and THROAT:  Unremarkable.  RESPIRATORY:  Unremarkable. CARDIOVASCULAR:  Unremarkable.  GASTROINTESTINAL:  As per HPI. GENITOURINARY:  As per HPI.  SKIN, MUSCULOSKELETAL, ENDOCRINE and NEURO: Are all unremarkable.  PHYSICAL EXAMINATION:  VITAL SIGNS:  Temperature 98.4, heart rate 96, respirations 18, blood pressure 144/96, and he is 99% O2 saturation on room air. GENERAL:  He appears uncomfortable, but does not appearing acute distress.  He is alert and oriented x3. HEENT:  Scalp, no  deformities or mass.  Eyes, pupils equal, round, reactive.  Extraocular movements are intact.  No sclera icterus. Conjunctivae are noted.  Oral mucosa is pink.  Normal occlusion. NECK:  Trachea is midline.  No cervical lymphadenopathy. PULMONARY:  Unlabored respiration.  He is clear to auscultation. CARDIOVASCULAR:  Regular rate and rhythm.  No murmurs or gallops.  He has 2+ radial and femoral pulses bilaterally. ABDOMINAL:  Positive bowel sounds.  Abdomen is soft and mildly obese. He has some mild-to-moderate right lower quadrant abdominal pain.  No rebound tenderness.  No Rovsing sign.  No diffuse peritoneal signs.  No hernias or masses are apparent. EXTREMITIES:  Warm and dry.  PERTINENT LABORATORY AND RADIOGRAPHIC STUDIES:  CBC, white blood cell count 7.9, hemoglobin 15.0, hematocrit 40.1, and platelets 266.  Basic metabolic panel, sodium 137, potassium 3.8, chloride 98, bicarb 37, BUN 21, creatinine 0.89, and glucose 93.  CT of the pelvis demonstrated no evidence any free air or free fluid.  The appendix is visualized and appears normal.  There was stool noted throughout the colon, but no small or large bowel distention.  No evidence of any calcified renal stones.  ASSESSMENT AND PLAN:  Right lower quadrant abdominal pain.  At this point, the patient will be admitted for  continued monitoring and evaluation.  At this point, a low suspicion of acute appendiceal process, however, he will continue to be monitored.  Additionally, he will be aggressively fluid resuscitated with his history of renal calculi, although this does not appear classic for or demonstrate evidence of hematuria.  A renal calculi is still in the differential, continue to encourage high urine output and continue pain management. Continue close monitoring.  The patient understands and agreement with this plan.     Tilford Pillar, MD     BZ/MEDQ  D:  08/04/2010  T:  08/04/2010  Job:  [id number]

## 2010-08-04 NOTE — Discharge Summary (Addendum)
[name], [name] NO.:  [id number]  MEDICAL RECORD NO.:  [id number]  LOCATION:  A316                          FACILITY:  [facility]  PHYSICIAN:  Tilford Pillar, MD      DATE OF BIRTH:  03-16-1975  DATE OF ADMISSION:  07/18/2010 DATE OF DISCHARGE:  07/04/2012LH                              DISCHARGE SUMMARY   ADMISSION DIAGNOSIS:  Right lower quadrant abdominal pain.  DISCHARGE DIAGNOSES: 1. Resolution of right lower quadrant abdominal pain. 2. History of degenerative joint disease. 3. History of kidney stones. 4. History of migraine headaches. 5. History of shingles.  PROCEDURES:  None.  DISPOSITION:  Home.  HISTORY AND PHYSICAL:  Please see admission history and physical for complete H and P.  HOSPITAL COURSE:  The patient was admitted on July 18, 2010, being seen initially in the emergency department for right lower quadrant abdominal pain.  His pain was persistent; however, workup was negative for any evidence of acute appendicitis.  However, due to his consistent pain, he was admitted for continued monitoring and workup.  He was continued on an n.p.o. status, continued on IV fluid hydration, continued on bowel rest.  He had had previous symptoms of this in the past and due to his nonclassic presentation, a Meckel scan was obtained to rule out Meckel diverticulum as a possible etiology of his symptoms.  This did return negative.  His symptoms did slowly improve with continued IV fluid hydration and bowel washout.  With improvement of his symptomatology, he was advanced slowly back to a regular diet and was tolerating this.  On July 21, 2010, the patient was tolerating a regular diet.  His pain was resolved and plans were made for discharge.  DISCHARGE INSTRUCTIONS:  He was instructed to continue activity as tolerated.  He is to continue a low-residual diet.  He is to call my office should he have any questions or concerns.  He is to return to see me in  the office as needed.  DISCHARGE MEDICATIONS:  Please see the discharge medical reconciliation sheet for all discharge medications.     Tilford Pillar, MD     BZ/MEDQ  D:  08/04/2010  T:  08/04/2010  Job:  [id number]

## 2011-03-29 ENCOUNTER — Ambulatory Visit
Admission: RE | Admit: 2011-03-29 | Discharge: 2011-03-29 | Disposition: A | Discharge status: Home / Self Care | Payer: 59 | Source: Ambulatory Visit | Attending: Family Medicine | Admitting: Family Medicine

## 2011-03-29 ENCOUNTER — Other Ambulatory Visit: Payer: Self-pay | Admitting: Family Medicine

## 2011-03-29 DIAGNOSIS — R109 Unspecified abdominal pain: Secondary | ICD-10-CM

## 2011-04-04 ENCOUNTER — Emergency Department (HOSPITAL_COMMUNITY)
Admission: EM | Admit: 2011-04-04 | Discharge: 2011-04-05 | Disposition: A | Discharge status: Home / Self Care | Payer: 59 | Attending: Emergency Medicine | Admitting: Emergency Medicine

## 2011-04-04 ENCOUNTER — Encounter (HOSPITAL_COMMUNITY): Payer: Self-pay | Admitting: *Deleted

## 2011-04-04 ENCOUNTER — Emergency Department (HOSPITAL_COMMUNITY): Payer: 59

## 2011-04-04 DIAGNOSIS — R112 Nausea with vomiting, unspecified: Secondary | ICD-10-CM | POA: Insufficient documentation

## 2011-04-04 DIAGNOSIS — Z79899 Other long term (current) drug therapy: Secondary | ICD-10-CM | POA: Insufficient documentation

## 2011-04-04 DIAGNOSIS — R079 Chest pain, unspecified: Secondary | ICD-10-CM | POA: Insufficient documentation

## 2011-04-04 DIAGNOSIS — K5289 Other specified noninfective gastroenteritis and colitis: Secondary | ICD-10-CM | POA: Insufficient documentation

## 2011-04-04 DIAGNOSIS — N451 Epididymitis: Secondary | ICD-10-CM

## 2011-04-04 DIAGNOSIS — R509 Fever, unspecified: Secondary | ICD-10-CM | POA: Insufficient documentation

## 2011-04-04 DIAGNOSIS — N453 Epididymo-orchitis: Secondary | ICD-10-CM | POA: Insufficient documentation

## 2011-04-04 DIAGNOSIS — M549 Dorsalgia, unspecified: Secondary | ICD-10-CM | POA: Insufficient documentation

## 2011-04-04 DIAGNOSIS — R109 Unspecified abdominal pain: Secondary | ICD-10-CM | POA: Insufficient documentation

## 2011-04-04 DIAGNOSIS — K529 Noninfective gastroenteritis and colitis, unspecified: Secondary | ICD-10-CM

## 2011-04-04 DIAGNOSIS — R197 Diarrhea, unspecified: Secondary | ICD-10-CM | POA: Insufficient documentation

## 2011-04-04 LAB — URINALYSIS, ROUTINE W REFLEX MICROSCOPIC
Glucose, UA: NEGATIVE mg/dL
Ketones, ur: NEGATIVE mg/dL
Leukocytes, UA: NEGATIVE
Nitrite: NEGATIVE
Protein, ur: NEGATIVE mg/dL
Specific Gravity, Urine: >1.03 — ABNORMAL HIGH (ref 1.005–1.030)
Urobilinogen, UA: 0.2 mg/dL (ref 0.0–1.0)
pH: 5.5 (ref 5.0–8.0)

## 2011-04-04 LAB — COMPREHENSIVE METABOLIC PANEL
ALT: 25 U/L (ref 0–53)
AST: 22 U/L (ref 0–37)
Albumin: 4.5 g/dL (ref 3.5–5.2)
Alkaline Phosphatase: 117 U/L (ref 39–117)
BUN: 18 mg/dL (ref 6–23)
CO2: 24 mEq/L (ref 19–32)
Calcium: 10 mg/dL (ref 8.4–10.5)
Chloride: 99 mEq/L (ref 96–112)
Creatinine, Ser: 0.9 mg/dL (ref 0.50–1.35)
GFR calc Af Amer: >90 mL/min (ref >90)
GFR calc non Af Amer: >90 mL/min (ref >90)
Glucose, Bld: 102 mg/dL — ABNORMAL HIGH (ref 70–99)
Potassium: 4.1 mEq/L (ref 3.5–5.1)
Sodium: 137 mEq/L (ref 135–145)
Total Bilirubin: 0.6 mg/dL (ref 0.3–1.2)
Total Protein: 8.7 g/dL — ABNORMAL HIGH (ref 6.0–8.3)

## 2011-04-04 LAB — CBC
HCT: 44.7 % (ref 39.0–52.0)
Hemoglobin: 15.9 g/dL (ref 13.0–17.0)
MCH: 29.8 pg (ref 26.0–34.0)
MCHC: 35.6 g/dL (ref 30.0–36.0)
MCV: 83.7 fL (ref 78.0–100.0)
Platelets: 221 10*3/uL (ref 150–400)
RBC: 5.34 MIL/uL (ref 4.22–5.81)
RDW: 13.5 % (ref 11.5–15.5)
WBC: 10.8 10*3/uL — ABNORMAL HIGH (ref 4.0–10.5)

## 2011-04-04 LAB — URINE MICROSCOPIC-ADD ON

## 2011-04-04 LAB — LIPASE, BLOOD: Lipase: 18 U/L (ref 11–59)

## 2011-04-04 MED ORDER — HYDROMORPHONE HCL PF 1 MG/ML IJ SOLN
1.0000 mg | Freq: Once | INTRAMUSCULAR | Status: AC
  Administered 2011-04-05: 1 mg via INTRAVENOUS
  Filled 2011-04-04: qty 1

## 2011-04-04 MED ORDER — SODIUM CHLORIDE 0.9 % IV BOLUS (SEPSIS)
250.0000 mL | Freq: Once | INTRAVENOUS | Status: AC
  Administered 2011-04-04: 250 mL via INTRAVENOUS

## 2011-04-04 MED ORDER — PROMETHAZINE HCL 25 MG PO TABS: 25.0000 mg | ORAL_TABLET | Freq: Four times a day (QID) | ORAL | Status: DC | PRN

## 2011-04-04 MED ORDER — ONDANSETRON HCL 4 MG/2ML IJ SOLN
4.0000 mg | Freq: Once | INTRAMUSCULAR | Status: AC
  Administered 2011-04-04: 4 mg via INTRAVENOUS
  Filled 2011-04-04: qty 2

## 2011-04-04 MED ORDER — SODIUM CHLORIDE 0.9 % IV SOLN
INTRAVENOUS | Status: DC
  Administered 2011-04-04: 21:00:00 via INTRAVENOUS

## 2011-04-04 MED ORDER — HYDROCODONE-ACETAMINOPHEN 5-325 MG PO TABS: 1.0000 | ORAL_TABLET | Freq: Four times a day (QID) | ORAL | Status: AC | PRN

## 2011-04-04 MED ORDER — ONDANSETRON HCL 4 MG/2ML IJ SOLN
4.0000 mg | Freq: Once | INTRAMUSCULAR | Status: AC
  Administered 2011-04-05: 4 mg via INTRAVENOUS
  Filled 2011-04-04: qty 2

## 2011-04-04 MED ORDER — HYDROMORPHONE HCL PF 1 MG/ML IJ SOLN
1.0000 mg | Freq: Once | INTRAMUSCULAR | Status: AC
  Administered 2011-04-04: 1 mg via INTRAVENOUS
  Filled 2011-04-04: qty 1

## 2011-04-04 MED ORDER — ONDANSETRON 8 MG PO TBDP: 8.0000 mg | ORAL_TABLET | Freq: Three times a day (TID) | ORAL | Status: AC | PRN

## 2011-04-04 NOTE — ED Provider Notes (Signed)
History   This chart was scribed for Shelda Jakes, MD by Sofie Rower. The patient was seen in room [facility]2/[facility] and the patient's care was started at 8:24PM.    CSN: [id number]  Arrival date & time 04/04/11  1639   First MD Initiated Contact with Patient 04/04/11 2004      Chief Complaint  Patient presents with  . Emesis    (Consider location/radiation/quality/duration/timing/severity/associated sxs/prior treatment) HPI  [name] is a 36 y.o. male who presents to the Emergency Department complaining of moderate, constant emesis onset one week ago with associated symptoms of diarrhea, nausea, abd cramps, fever (99), back pain, chest soreness. Pt states "he has been on Cipro for two days, and did not have a cat scan done". Modifying factors include taking Flomax which moderately relieves the pain. Pt has a hx of degenerative disk disease.  Pt denies hematuria, cough, congestion, sore throat, rash, shortness of breath.   PCP is [facility].  Past Medical History  Diagnosis Date  . DDD (degenerative disc disease)   . Shingles   . Renal calculi       History  Substance Use Topics  . Smoking status: Not on file  . Smokeless tobacco: Not on file  . Alcohol Use: Not on file      Review of Systems  10 Systems reviewed and are negative for acute change except as noted in the HPI.   Allergies  Erythromycin  Home Medications   Current Outpatient Rx  Name Route Sig Dispense Refill  . RELPAX PO Oral Take 1 tablet by mouth as needed. For migraines    . IMIPRAMINE HCL PO Oral Take 1 tablet by mouth at bedtime.    Marland Kitchen PROMETHAZINE HCL 25 MG PO TABS Oral Take 25 mg by mouth every 6 (six) hours as needed. For nausea    . PROPRANOLOL HCL 20 MG PO TABS Oral Take 20 mg by mouth 2 (two) times daily.    . SERTRALINE HCL 50 MG PO TABS Oral Take 50 mg by mouth 2 (two) times daily.    Marland Kitchen HYDROCODONE-ACETAMINOPHEN 5-325 MG PO TABS Oral Take 1-2 tablets by mouth every 6 (six) hours  as needed for pain. 10 tablet 0  . ONDANSETRON 8 MG PO TBDP Oral Take 1 tablet (8 mg total) by mouth every 8 (eight) hours as needed for nausea. 10 tablet 0  . PROMETHAZINE HCL 25 MG PO TABS Oral Take 1 tablet (25 mg total) by mouth every 6 (six) hours as needed for nausea. 12 tablet 0    BP 113/83  Pulse 147  Temp(Src) 99 F (37.2 C) (Oral)  Resp 16  Ht 5\' 10"  (1.778 m)  Wt 200 lb (90.719 kg)  BMI 28.70 kg/m2  SpO2 98%  Physical Exam  Nursing note and vitals reviewed. Constitutional: He is oriented to person, place, and time. He appears well-developed and well-nourished.  HENT:  Head: Normocephalic and atraumatic.  Right Ear: External ear normal.  Left Ear: External ear normal.  Nose: Nose normal.  Eyes: Conjunctivae and EOM are normal. No scleral icterus.  Neck: Normal range of motion. Neck supple. No thyromegaly present.  Cardiovascular: Normal rate, regular rhythm and normal heart sounds.  Exam reveals no gallop and no friction rub.   No murmur heard. Pulmonary/Chest: Breath sounds normal. No stridor. He has no wheezes. He has no rales. He exhibits no tenderness.  Abdominal: Bowel sounds are normal. He exhibits no distension. There is tenderness (Lower quadrants. ).  There is no rebound.  Musculoskeletal: Normal range of motion. He exhibits no edema.  Lymphadenopathy:    He has no cervical adenopathy.  Neurological: He is oriented to person, place, and time. Coordination normal.  Skin: No rash noted. No erythema.  Psychiatric: He has a normal mood and affect. His behavior is normal.    ED Course  Procedures (including critical care time)  DIAGNOSTIC STUDIES: Oxygen Saturation is 98% on room air, normal by my interpretation.    COORDINATION OF CARE:  Results for orders placed during the hospital encounter of 04/04/11  CBC      Component Value Range   WBC 10.8 (*) 4.0 - 10.5 (K/uL)   RBC 5.34  4.22 - 5.81 (MIL/uL)   Hemoglobin 15.9  13.0 - 17.0 (g/dL)   HCT 16.1   09.6 - 04.5 (%)   MCV 83.7  78.0 - 100.0 (fL)   MCH 29.8  26.0 - 34.0 (pg)   MCHC 35.6  30.0 - 36.0 (g/dL)   RDW 40.9  81.1 - 91.4 (%)   Platelets 221  150 - 400 (K/uL)  COMPREHENSIVE METABOLIC PANEL      Component Value Range   Sodium 137  135 - 145 (mEq/L)   Potassium 4.1  3.5 - 5.1 (mEq/L)   Chloride 99  96 - 112 (mEq/L)   CO2 24  19 - 32 (mEq/L)   Glucose, Bld 102 (*) 70 - 99 (mg/dL)   BUN 18  6 - 23 (mg/dL)   Creatinine, Ser 7.82  0.50 - 1.35 (mg/dL)   Calcium 95.6  8.4 - 10.5 (mg/dL)   Total Protein 8.7 (*) 6.0 - 8.3 (g/dL)   Albumin 4.5  3.5 - 5.2 (g/dL)   AST 22  0 - 37 (U/L)   ALT 25  0 - 53 (U/L)   Alkaline Phosphatase 117  39 - 117 (U/L)   Total Bilirubin 0.6  0.3 - 1.2 (mg/dL)   GFR calc non Af Amer >90  >90 (mL/min)   GFR calc Af Amer >90  >90 (mL/min)  URINALYSIS, ROUTINE W REFLEX MICROSCOPIC      Component Value Range   Color, Urine AMBER (*) YELLOW    APPearance CLEAR  CLEAR    Specific Gravity, Urine >1.030 (*) 1.005 - 1.030    pH 5.5  5.0 - 8.0    Glucose, UA NEGATIVE  NEGATIVE (mg/dL)   Hgb urine dipstick TRACE (*) NEGATIVE    Bilirubin Urine SMALL (*) NEGATIVE    Ketones, ur NEGATIVE  NEGATIVE (mg/dL)   Protein, ur NEGATIVE  NEGATIVE (mg/dL)   Urobilinogen, UA 0.2  0.0 - 1.0 (mg/dL)   Nitrite NEGATIVE  NEGATIVE    Leukocytes, UA NEGATIVE  NEGATIVE   LIPASE, BLOOD      Component Value Range   Lipase 18  11 - 59 (U/L)  URINE MICROSCOPIC-ADD ON      Component Value Range   Squamous Epithelial / LPF RARE  RARE    WBC, UA 3-6  <3 (WBC/hpf)   RBC / HPF 0-2  <3 (RBC/hpf)   Bacteria, UA FEW (*) RARE    Casts HYALINE CASTS (*) NEGATIVE    Ct Abdomen Pelvis Wo Contrast  04/04/2011  *RADIOLOGY REPORT*  Clinical Data: Left flank pain and left lower quadrant abdominal pain.  History of renal stones.  CT ABDOMEN AND PELVIS WITHOUT CONTRAST  Technique:  Multidetector CT imaging of the abdomen and pelvis was performed following the standard protocol without  intravenous  contrast.  Comparison: CT of the abdomen and pelvis performed 07/18/2010  Findings: Minimal bibasilar atelectasis is noted.  The liver and spleen are unremarkable in appearance.  The gallbladder is within normal limits.  The pancreas and adrenal glands are unremarkable.  Two tiny nonobstructing stones are noted in the right kidney, measuring up to 3 mm in size.  The kidneys are otherwise unremarkable in appearance.  There is no evidence of hydronephrosis.  No obstructing ureteral stones are seen.  Minimal nonspecific perinephric stranding is noted bilaterally.  No free fluid is identified.  The small bowel is unremarkable in appearance.  The stomach is within normal limits.  No acute vascular abnormalities are seen.  The appendix is normal in caliber, without evidence for appendicitis.  The colon is partially filled with fluid, and is unremarkable in appearance.  The bladder is mildly distended and grossly unremarkable in appearance.  The prostate remains normal in size, with minimal calcification.  No inguinal lymphadenopathy is seen.  No acute osseous abnormalities are identified.  IMPRESSION:  1.  No evidence of hydronephrosis; no obstructing ureteral stones seen. 2.  Two tiny nonobstructing stones noted in the right kidney, measuring up to 3 mm in size.  Original Report Authenticated By: Tonia Ghent, M.D.         Labs Reviewed  CBC - Abnormal; Notable for the following:    WBC 10.8 (*)    All other components within normal limits  COMPREHENSIVE METABOLIC PANEL - Abnormal; Notable for the following:    Glucose, Bld 102 (*)    Total Protein 8.7 (*)    All other components within normal limits  URINALYSIS, ROUTINE W REFLEX MICROSCOPIC - Abnormal; Notable for the following:    Color, Urine AMBER (*) BIOCHEMICALS MAY BE AFFECTED BY COLOR   Specific Gravity, Urine >1.030 (*)    Hgb urine dipstick TRACE (*)    Bilirubin Urine SMALL (*)    All other components within normal limits    URINE MICROSCOPIC-ADD ON - Abnormal; Notable for the following:    Bacteria, UA FEW (*)    Casts HYALINE CASTS (*)    All other components within normal limits  LIPASE, BLOOD  URINE CULTURE   Ct Abdomen Pelvis Wo Contrast  04/04/2011  *RADIOLOGY REPORT*  Clinical Data: Left flank pain and left lower quadrant abdominal pain.  History of renal stones.  CT ABDOMEN AND PELVIS WITHOUT CONTRAST  Technique:  Multidetector CT imaging of the abdomen and pelvis was performed following the standard protocol without intravenous contrast.  Comparison: CT of the abdomen and pelvis performed 07/18/2010  Findings: Minimal bibasilar atelectasis is noted.  The liver and spleen are unremarkable in appearance.  The gallbladder is within normal limits.  The pancreas and adrenal glands are unremarkable.  Two tiny nonobstructing stones are noted in the right kidney, measuring up to 3 mm in size.  The kidneys are otherwise unremarkable in appearance.  There is no evidence of hydronephrosis.  No obstructing ureteral stones are seen.  Minimal nonspecific perinephric stranding is noted bilaterally.  No free fluid is identified.  The small bowel is unremarkable in appearance.  The stomach is within normal limits.  No acute vascular abnormalities are seen.  The appendix is normal in caliber, without evidence for appendicitis.  The colon is partially filled with fluid, and is unremarkable in appearance.  The bladder is mildly distended and grossly unremarkable in appearance.  The prostate remains normal in size, with minimal calcification.  No inguinal  lymphadenopathy is seen.  No acute osseous abnormalities are identified.  IMPRESSION:  1.  No evidence of hydronephrosis; no obstructing ureteral stones seen. 2.  Two tiny nonobstructing stones noted in the right kidney, measuring up to 3 mm in size.  Original Report Authenticated By: Tonia Ghent, M.D.     1. Gastroenteritis   2. Epididymitis      8:27PM- EDP at bedside  discusses treatment plan.    MDM  Suspect 2 processes going on patient most likely with epididymitis on the left side which is already being treated with antibiotics by the primary care doctor this appears to be improving based on the number of white cells in your then suspect patient got a viral gastroenteritis just in the last 24 hours that explains the vomiting and diarrhea and increased abdominal cramping. Workup here today without any significant findings urine culture is pending discontinue the antibiotic followup with primary care doctor in 2 days we will treat him with hydrocodone anti-medics and antidiarrhea medicine.      I personally performed the services described in this documentation, which was scribed in my presence. The recorded information has been reviewed and considered.     Shelda Jakes, MD 04/04/11 [phone]

## 2011-04-04 NOTE — ED Notes (Signed)
Pt requesting something for nausea & pain.

## 2011-04-04 NOTE — ED Notes (Signed)
Pt unable to give urine sample at this time 

## 2011-04-04 NOTE — ED Notes (Signed)
Pt given water at this time to encourage him to give a urine sample.

## 2011-04-04 NOTE — Discharge Instructions (Signed)
Take pain medicine as directed take Imodium as directed take antinausea medicine as directed continue your antibiotic. Followup with your primary care doctor in the next 2 days return for new or worse symptoms.Colitis Colitis is inflammation of the colon. Colitis can be a short-term or long-standing (chronic) illness. Crohn's disease and ulcerative colitis are 2 types of colitis which are chronic. They usually require lifelong treatment. CAUSES  There are many different causes of colitis, including:  Viruses.   Germs (bacteria).   Medicine reactions.  SYMPTOMS   Diarrhea.   Intestinal bleeding.   Pain.   Fever.   Throwing up (vomiting).   Tiredness (fatigue).   Weight loss.   Bowel blockage.  DIAGNOSIS  The diagnosis of colitis is based on examination and stool or blood tests. X-rays, CT scan, and colonoscopy may also be needed. TREATMENT  Treatment may include:  Fluids given through the vein (intravenously).   Bowel rest (nothing to eat or drink for a period of time).   Medicine for pain and diarrhea.   Medicines (antibiotics) that kill germs.   Cortisone medicines.   Surgery.  HOME CARE INSTRUCTIONS   Get plenty of rest.   Drink enough water and fluids to keep your urine clear or pale yellow.   Eat a well-balanced diet.   Call your caregiver for follow-up as recommended.  SEEK IMMEDIATE MEDICAL CARE IF:   You develop chills.   You have an oral temperature above 102 F (38.9 C), not controlled by medicine.   You have extreme weakness, fainting, or dehydration.   You have repeated vomiting.   You develop severe belly (abdominal) pain or are passing bloody or tarry stools.  MAKE SURE YOU:   Understand these instructions.   Will watch your condition.   Will get help right away if you are not doing well or get worse.  Document Released: 02/11/2004 Document Revised: 12/23/2010 Document Reviewed: 05/08/2009 [facility] Patient Information 2012  [place], [place].

## 2011-04-04 NOTE — ED Notes (Signed)
Pt states being tx w/ cipro started 2 days ago. Started w/ n/v today.

## 2011-04-04 NOTE — ED Notes (Signed)
Nausea and vomiting, pain in groin area, states he has been treated for epidymitis

## 2011-04-05 LAB — URINE CULTURE
Colony Count: NO GROWTH
Culture  Setup Time: 201303182317
Culture: NO GROWTH

## 2011-04-05 MED ORDER — LOPERAMIDE HCL 2 MG PO TABS: 2.0000 mg | ORAL_TABLET | Freq: Four times a day (QID) | ORAL | Status: AC | PRN

## 2011-04-05 NOTE — ED Notes (Signed)
Pt alert & oriented x4, stable gait. Pt given discharge instructions, paperwork & prescription(s). Patient instructed to stop at the registration desk to finish any additional paperwork. pt verbalized understanding. Pt left department w/ no further questions.  

## 2011-08-05 ENCOUNTER — Encounter (HOSPITAL_COMMUNITY): Payer: Self-pay | Admitting: *Deleted

## 2011-08-05 ENCOUNTER — Emergency Department (HOSPITAL_COMMUNITY)
Admission: EM | Admit: 2011-08-05 | Discharge: 2011-08-05 | Disposition: A | Discharge status: Home / Self Care | Payer: PRIVATE HEALTH INSURANCE | Attending: Emergency Medicine | Admitting: Emergency Medicine

## 2011-08-05 ENCOUNTER — Emergency Department (HOSPITAL_COMMUNITY): Payer: PRIVATE HEALTH INSURANCE

## 2011-08-05 DIAGNOSIS — T07XXXA Unspecified multiple injuries, initial encounter: Secondary | ICD-10-CM

## 2011-08-05 DIAGNOSIS — Y9289 Other specified places as the place of occurrence of the external cause: Secondary | ICD-10-CM | POA: Insufficient documentation

## 2011-08-05 DIAGNOSIS — W07XXXA Fall from chair, initial encounter: Secondary | ICD-10-CM | POA: Insufficient documentation

## 2011-08-05 DIAGNOSIS — S161XXA Strain of muscle, fascia and tendon at neck level, initial encounter: Secondary | ICD-10-CM

## 2011-08-05 DIAGNOSIS — Z79899 Other long term (current) drug therapy: Secondary | ICD-10-CM | POA: Insufficient documentation

## 2011-08-05 DIAGNOSIS — M25519 Pain in unspecified shoulder: Secondary | ICD-10-CM | POA: Insufficient documentation

## 2011-08-05 DIAGNOSIS — S139XXA Sprain of joints and ligaments of unspecified parts of neck, initial encounter: Secondary | ICD-10-CM | POA: Insufficient documentation

## 2011-08-05 DIAGNOSIS — M545 Low back pain, unspecified: Secondary | ICD-10-CM | POA: Insufficient documentation

## 2011-08-05 DIAGNOSIS — R079 Chest pain, unspecified: Secondary | ICD-10-CM | POA: Insufficient documentation

## 2011-08-05 MED ORDER — CYCLOBENZAPRINE HCL 10 MG PO TABS: 10.0000 mg | ORAL_TABLET | Freq: Three times a day (TID) | ORAL | Status: AC | PRN

## 2011-08-05 MED ORDER — IBUPROFEN 800 MG PO TABS
800.0000 mg | ORAL_TABLET | Freq: Once | ORAL | Status: AC
  Administered 2011-08-05: 800 mg via ORAL
  Filled 2011-08-05: qty 1

## 2011-08-05 MED ORDER — OXYCODONE-ACETAMINOPHEN 5-325 MG PO TABS
1.0000 | ORAL_TABLET | Freq: Once | ORAL | Status: AC
  Administered 2011-08-05: 1 via ORAL
  Filled 2011-08-05: qty 1

## 2011-08-05 MED ORDER — HYDROCODONE-ACETAMINOPHEN 5-325 MG PO TABS: ORAL_TABLET | ORAL | Status: AC

## 2011-08-05 NOTE — ED Notes (Signed)
Advised  That he could not drive prior to med administration

## 2011-08-05 NOTE — ED Notes (Signed)
C/o pain in right side of head temporal region, right shoulder, right elbow and right lateral ribs.states he was going to the printer and the chair turned over on the side and flipped him out.

## 2011-08-05 NOTE — ED Notes (Signed)
Fell when chair collapsed under him.  Pain  Rt scapula,rt leg, low back,  No head injury  Pt was working here in [employer]

## 2011-08-05 NOTE — ED Provider Notes (Signed)
History     CSN: [id number]  Arrival date & time 08/05/11  2114   First MD Initiated Contact with Patient 08/05/11 2122      Chief Complaint  Patient presents with  . Fall    (Consider location/radiation/quality/duration/timing/severity/associated sxs/prior treatment) HPI Comments: Patient complains of pain to his right neck, right ribs, right shoulder, and  lower back.  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 States that he struck his right shoulder and chest wall. Pain is worse with movement and improves with rest. He denies numbness, weakness, vomiting, head injury, visual changes or loss of consciousness.  Patient is a 36 y.o. male presenting with fall. The history is provided by the patient.  Fall The accident occurred less than 1 hour ago. Incident: while sitting in a chair. He fell from a height of 1 to 2 ft. He landed on a hard floor. There was no blood loss. The point of impact was the right shoulder (lower back). The pain is present in the right shoulder (Lower back, right neck, right ribs). The pain is mild. He was ambulatory at the scene. There was no entrapment after the fall. There was no drug use involved in the accident. There was no alcohol use involved in the accident. Pertinent negatives include no visual change, no fever, no numbness, no abdominal pain, no bowel incontinence, no nausea, no vomiting, no headaches, no hearing loss, no loss of consciousness and no tingling. The symptoms are aggravated by activity and ambulation. He has tried nothing for the symptoms. The treatment provided no relief.    Past Medical History  Diagnosis Date  . DDD (degenerative disc disease)   . Shingles   . Renal calculi     Past Surgical History  Procedure Date  . Lithotripsy     History reviewed. No pertinent family history.  History  Substance Use Topics  . Smoking status:  Never Smoker   . Smokeless tobacco: Not on file  . Alcohol Use:      rarely      Review of Systems  Constitutional: Negative for fever and chills.  HENT: Positive for neck pain. Negative for facial swelling.   Eyes: Negative for visual disturbance.  Respiratory: Negative for chest tightness and shortness of breath.   Cardiovascular:       Rib pain  Gastrointestinal: Negative for nausea, vomiting, abdominal pain, abdominal distention and bowel incontinence.  Genitourinary: Negative for dysuria, flank pain and difficulty urinating.  Musculoskeletal: Positive for joint swelling and arthralgias.  Skin: Negative for color change and wound.  Neurological: Negative for dizziness, tingling, loss of consciousness, numbness and headaches.  All other systems reviewed and are negative.    Allergies  Erythromycin  Home Medications   Current Outpatient Rx  Name Route Sig Dispense Refill  . RELPAX PO Oral Take 1 tablet by mouth as needed. For migraines    . IMIPRAMINE HCL 50 MG PO TABS Oral Take 50 mg by mouth at bedtime.    Marland Kitchen PROMETHAZINE HCL 25 MG PO TABS Oral Take 25 mg by mouth every 6 (six) hours as needed. For nausea    . PROPRANOLOL HCL 20 MG PO TABS Oral Take 20 mg by mouth 2 (two) times daily.    . SERTRALINE HCL 50 MG PO TABS Oral Take 50 mg by mouth 2 (two) times daily.    Marland Kitchen  CYCLOBENZAPRINE HCL 10 MG PO TABS Oral Take 1 tablet (10 mg total) by mouth 3 (three) times daily as needed for muscle spasms. 21 tablet 0  . HYDROCODONE-ACETAMINOPHEN 5-325 MG PO TABS  Take one-two tabs po q 4-6 hrs prn pain 15 tablet 0    BP 148/102  Pulse 81  Temp 97.7 F (36.5 C) (Oral)  Resp 20  Ht 5' 10.5" (1.791 m)  Wt 210 lb (95.255 kg)  BMI 29.71 kg/m2  SpO2 100%  Physical Exam  Nursing note and vitals reviewed. Constitutional: He is oriented to person, place, and time. He appears well-developed and well-nourished. No distress.  HENT:  Head: Normocephalic and atraumatic.  Mouth/Throat:  Oropharynx is clear and moist.  Eyes: EOM are normal. Pupils are equal, round, and reactive to light.  Neck: Normal range of motion and phonation normal. Neck supple. Muscular tenderness present. No spinous process tenderness present. Normal range of motion present.    Cardiovascular: Normal rate, regular rhythm and intact distal pulses.   No murmur heard. Pulmonary/Chest: Effort normal and breath sounds normal. He exhibits tenderness. He exhibits no crepitus, no edema, no deformity and no swelling.    Abdominal: Soft. He exhibits no distension and no mass. There is no tenderness. There is no rebound and no guarding.  Musculoskeletal: He exhibits tenderness. He exhibits no edema.       Right shoulder: He exhibits tenderness. He exhibits normal range of motion, no bony tenderness, no swelling, no effusion, no crepitus, no deformity, no laceration, normal pulse and normal strength.       Lumbar back: He exhibits tenderness and pain. He exhibits normal range of motion, no swelling, no deformity, no laceration and normal pulse.       Back:       Arms:      ttp of the anterior and posterior aspects of the right shoulder.  No edema, crepitus, abrasions or bruising.  nml ROM  Neurological: He is alert and oriented to person, place, and time. No cranial nerve deficit or sensory deficit. He exhibits normal muscle tone. Coordination and gait normal.  Reflex Scores:      Patellar reflexes are 2+ on the right side and 2+ on the left side.      Achilles reflexes are 2+ on the right side and 2+ on the left side. Skin: Skin is warm and dry.    ED Course  Procedures (including critical care time)  Labs Reviewed - No data to display Dg Ribs Unilateral W/chest Right  08/05/2011  *RADIOLOGY REPORT*  Clinical Data: Right-sided back and rib pain after fall.  RIGHT RIBS AND CHEST - 3+ VIEW  Comparison: Chest 07/24/2008  Findings: Shallow inspiration.  Heart size and pulmonary vascularity are normal for  technique.  No focal airspace consolidation in the lungs.  No blunting of costophrenic angles. No pneumothorax.  Right ribs appear intact.  No displaced fractures identified.  No focal bone lesion.  IMPRESSION: No evidence of active pulmonary disease.  No displaced rib fractures identified.  Original Report Authenticated By: Marlon Pel, M.D.   Dg Cervical Spine Complete  08/05/2011  *RADIOLOGY REPORT*  Clinical Data: Fall from a seated position on to a hardwood floor. Right-sided back pain and rib pain.  CERVICAL SPINE - COMPLETE 4+ VIEW  Comparison: None.  Findings: There is mid cervical degenerative change, most notable at C5-6, C6-7. There is loss of cervical lordosis.  This may be secondary to splinting, soft tissue injury, or positioning.  There is no evidence for acute fracture.  The lung apices are clear.  IMPRESSION:  1.  Mid cervical degenerative change. 2.  Loss of lordosis.  See above. 3.  No evidence for fracture.  Original Report Authenticated By: Patterson Hammersmith, M.D.   Dg Lumbar Spine Complete  08/05/2011  *RADIOLOGY REPORT*  Clinical Data: Right-sided back pain after fall.  LUMBAR SPINE - COMPLETE 4+ VIEW  Comparison: CT abdomen and pelvis 05/03/2011  Findings: Five lumbar type vertebral bodies with a transitional sixth lumbosacral vertebra.  Normal alignment of the lumbar vertebrae and facet joints.  No vertebral compression deformities. Intervertebral disc space heights are preserved.  Mild anterior endplate hypertrophic changes.  No focal bone lesion or bone destruction.  Bone cortex and trabecular architecture appear intact.  IMPRESSION: No displaced fractures identified.  Mild degenerative changes. Transitional sixth lumbosacral vertebra.  Original Report Authenticated By: Marlon Pel, M.D.   Dg Shoulder Right  08/05/2011  *RADIOLOGY REPORT*  Clinical Data: Right scapular pain after fall.  RIGHT SHOULDER - 2+ VIEW  Comparison: None.  Findings: The right shoulder and  right scapula appear intact.  No evidence of acute fracture or subluxation.  No focal bone lesion or bone destruction.  Bone cortex and trabecular architecture appear intact.  Coracoclavicular and acromioclavicular spaces are normal.  IMPRESSION: No acute bony abnormalities.  Original Report Authenticated By: Marlon Pel, M.D.     1. Cervical strain   2. Multiple contusions       MDM    Patient is alert. Vital signs are stable. He ambulates with a steady gait. No focal neuro deficits on exam.  Patient was medicated with ibuprofen and Percocet. He states that he is feeling better. Imaging tonight has been reviewed, patient agrees to close followup with his primary care physician or to return here if his symptoms worsen.  The patient appears reasonably screened and/or stabilized for discharge and I doubt any other medical condition or other [facility] requiring further screening, evaluation, or treatment in the ED at this time prior to discharge.   Prescribed: norco #15 flexeril  Patria Warzecha L. Danella Philson, [place] 08/05/11 2316

## 2011-08-05 NOTE — ED Provider Notes (Signed)
Medical screening examination/treatment/procedure(s) were performed by non-physician practitioner and as supervising physician I was immediately available for consultation/collaboration.  Flint Melter, MD 08/05/11 [phone]

## 2011-09-06 ENCOUNTER — Emergency Department (HOSPITAL_COMMUNITY)
Admission: EM | Admit: 2011-09-06 | Discharge: 2011-09-07 | Disposition: A | Discharge status: Home / Self Care | Payer: 59 | Attending: Emergency Medicine | Admitting: Emergency Medicine

## 2011-09-06 ENCOUNTER — Encounter (HOSPITAL_COMMUNITY): Payer: Self-pay | Admitting: Emergency Medicine

## 2011-09-06 DIAGNOSIS — N23 Unspecified renal colic: Secondary | ICD-10-CM | POA: Insufficient documentation

## 2011-09-06 DIAGNOSIS — IMO0002 Reserved for concepts with insufficient information to code with codable children: Secondary | ICD-10-CM | POA: Insufficient documentation

## 2011-09-06 LAB — URINALYSIS, ROUTINE W REFLEX MICROSCOPIC
Bilirubin Urine: NEGATIVE
Glucose, UA: NEGATIVE mg/dL
Ketones, ur: NEGATIVE mg/dL
Leukocytes, UA: NEGATIVE
Nitrite: NEGATIVE
Protein, ur: 30 mg/dL — AB
Specific Gravity, Urine: >1.03 — ABNORMAL HIGH (ref 1.005–1.030)
Urobilinogen, UA: 0.2 mg/dL (ref 0.0–1.0)
pH: 6 (ref 5.0–8.0)

## 2011-09-06 LAB — URINE MICROSCOPIC-ADD ON

## 2011-09-06 MED ORDER — KETOROLAC TROMETHAMINE 30 MG/ML IJ SOLN
30.0000 mg | Freq: Once | INTRAMUSCULAR | Status: AC
  Administered 2011-09-06: 30 mg via INTRAVENOUS
  Filled 2011-09-06: qty 1

## 2011-09-06 MED ORDER — HYDROMORPHONE HCL PF 1 MG/ML IJ SOLN
1.0000 mg | Freq: Once | INTRAMUSCULAR | Status: AC
  Administered 2011-09-06: 1 mg via INTRAVENOUS
  Filled 2011-09-06: qty 1

## 2011-09-06 MED ORDER — ONDANSETRON HCL 4 MG/2ML IJ SOLN
4.0000 mg | Freq: Once | INTRAMUSCULAR | Status: AC
  Administered 2011-09-06: 4 mg via INTRAVENOUS
  Filled 2011-09-06: qty 2

## 2011-09-06 NOTE — ED Notes (Signed)
Patient complaining of right-sided flank pain radiating into groin area x 2 days. Also complaining of nausea.

## 2011-09-06 NOTE — ED Provider Notes (Signed)
History     CSN: [id number]  Arrival date & time 09/06/11  2301   First MD Initiated Contact with Patient 09/06/11 2318      Chief Complaint  Patient presents with  . Flank Pain    (Consider location/radiation/quality/duration/timing/severity/associated sxs/prior treatment) HPI Complains of left testicle pain radiating to left flank onset 2 days ago. Feels like kidney studies had in the past. No treatment prior to coming here. Associated symptoms include nausea no vomiting no fever. Nothing makes symptoms better or worse pain is constant and severe Past Medical History  Diagnosis Date  . DDD (degenerative disc disease)   . Shingles   . Renal calculi    migraines  Past Surgical History  Procedure Date  . Lithotripsy     History reviewed. No pertinent family history.  History  Substance Use Topics  . Smoking status: Never Smoker   . Smokeless tobacco: Not on file  . Alcohol Use: No     rarely      Review of Systems  Constitutional: Negative.   HENT: Negative.   Respiratory: Negative.   Cardiovascular: Negative.   Gastrointestinal: Positive for nausea.  Genitourinary: Positive for flank pain and testicular pain.  Musculoskeletal: Negative.   Skin: Negative.   Neurological: Negative.   Hematological: Negative.   Psychiatric/Behavioral: Negative.   All other systems reviewed and are negative.    Allergies  Erythromycin  Home Medications   Current Outpatient Rx  Name Route Sig Dispense Refill  . RELPAX PO Oral Take 1 tablet by mouth as needed. For migraines    . IMIPRAMINE HCL 50 MG PO TABS Oral Take 50 mg by mouth at bedtime.    Marland Kitchen PROMETHAZINE HCL 25 MG PO TABS Oral Take 25 mg by mouth every 6 (six) hours as needed. For nausea    . PROPRANOLOL HCL 20 MG PO TABS Oral Take 20 mg by mouth 2 (two) times daily.    . SERTRALINE HCL 50 MG PO TABS Oral Take 50 mg by mouth 2 (two) times daily.      BP 174/114  Pulse 97  Temp 98.2 F (36.8 C) (Oral)  Resp  20  Ht 5' 10.5" (1.791 m)  Wt 205 lb (92.987 kg)  BMI 29.00 kg/m2  SpO2 100%  Physical Exam  Nursing note and vitals reviewed. Constitutional: He appears well-developed and well-nourished. He appears distressed.       Appears uncomfortable  HENT:  Head: Normocephalic and atraumatic.  Eyes: Conjunctivae are normal. Pupils are equal, round, and reactive to light.  Neck: Neck supple. No tracheal deviation present. No thyromegaly present.  Cardiovascular: Normal rate and regular rhythm.   No murmur heard. Pulmonary/Chest: Effort normal and breath sounds normal.  Abdominal: Soft. Bowel sounds are normal. He exhibits no distension. There is no tenderness.  Genitourinary:       Normal male genitalia  Musculoskeletal: Normal range of motion. He exhibits no edema and no tenderness.  Neurological: He is alert. Coordination normal.  Skin: Skin is warm and dry. No rash noted.  Psychiatric: He has a normal mood and affect.    ED Course  Procedures (including critical care time)   Labs Reviewed  URINALYSIS, ROUTINE W REFLEX MICROSCOPIC   No results found.   No diagnosis found.  1 AM patient reports pain was improved with intravenous Toradol and hydromorphone however pain is beginning to recur. Additional hydromorphone ordered Pt signed out to Dr. Colon Branch 105 am  MDM  Dr. Colon Branch to make  final disposition Diagnosis ureteral colic        Doug Sou, MD 09/07/11 1610

## 2011-09-07 MED ORDER — ONDANSETRON HCL 4 MG PO TABS: 4.0000 mg | ORAL_TABLET | Freq: Four times a day (QID) | ORAL | Status: AC

## 2011-09-07 MED ORDER — HYDROCODONE-ACETAMINOPHEN 5-325 MG PO TABS: 1.0000 | ORAL_TABLET | ORAL | Status: AC | PRN

## 2011-09-07 MED ORDER — HYDROMORPHONE HCL PF 1 MG/ML IJ SOLN
1.0000 mg | Freq: Once | INTRAMUSCULAR | Status: AC
  Administered 2011-09-07: 1 mg via INTRAVENOUS
  Filled 2011-09-07: qty 1

## 2011-10-28 ENCOUNTER — Emergency Department (HOSPITAL_COMMUNITY): Payer: 59

## 2011-10-28 ENCOUNTER — Emergency Department (HOSPITAL_COMMUNITY)
Admission: EM | Admit: 2011-10-28 | Discharge: 2011-10-28 | Disposition: A | Discharge status: Home / Self Care | Payer: 59 | Attending: Emergency Medicine | Admitting: Emergency Medicine

## 2011-10-28 ENCOUNTER — Encounter (HOSPITAL_COMMUNITY): Payer: Self-pay | Admitting: *Deleted

## 2011-10-28 DIAGNOSIS — R509 Fever, unspecified: Secondary | ICD-10-CM | POA: Insufficient documentation

## 2011-10-28 DIAGNOSIS — R599 Enlarged lymph nodes, unspecified: Secondary | ICD-10-CM | POA: Insufficient documentation

## 2011-10-28 DIAGNOSIS — R42 Dizziness and giddiness: Secondary | ICD-10-CM | POA: Insufficient documentation

## 2011-10-28 DIAGNOSIS — Z79899 Other long term (current) drug therapy: Secondary | ICD-10-CM | POA: Insufficient documentation

## 2011-10-28 DIAGNOSIS — R591 Generalized enlarged lymph nodes: Secondary | ICD-10-CM

## 2011-10-28 DIAGNOSIS — M542 Cervicalgia: Secondary | ICD-10-CM | POA: Insufficient documentation

## 2011-10-28 DIAGNOSIS — E669 Obesity, unspecified: Secondary | ICD-10-CM | POA: Insufficient documentation

## 2011-10-28 DIAGNOSIS — R112 Nausea with vomiting, unspecified: Secondary | ICD-10-CM | POA: Insufficient documentation

## 2011-10-28 HISTORY — DX: Migraine, unspecified, not intractable, without status migrainosus: G43.909

## 2011-10-28 LAB — BASIC METABOLIC PANEL
BUN: 11 mg/dL (ref 6–23)
CO2: 29 mEq/L (ref 19–32)
Calcium: 9.7 mg/dL (ref 8.4–10.5)
Chloride: 99 mEq/L (ref 96–112)
Creatinine, Ser: 0.75 mg/dL (ref 0.50–1.35)
GFR calc Af Amer: >90 mL/min (ref >90)
GFR calc non Af Amer: >90 mL/min (ref >90)
Glucose, Bld: 91 mg/dL (ref 70–99)
Potassium: 3.9 mEq/L (ref 3.5–5.1)
Sodium: 140 mEq/L (ref 135–145)

## 2011-10-28 LAB — CBC WITH DIFFERENTIAL/PLATELET
Basophils Absolute: 0 10*3/uL (ref 0.0–0.1)
Basophils Relative: 0 % (ref 0–1)
Eosinophils Absolute: 0.1 10*3/uL (ref 0.0–0.7)
Eosinophils Relative: 1 % (ref 0–5)
HCT: 40.7 % (ref 39.0–52.0)
Hemoglobin: 14.4 g/dL (ref 13.0–17.0)
Lymphocytes Relative: 27 % (ref 12–46)
Lymphs Abs: 1.8 10*3/uL (ref 0.7–4.0)
MCH: 29.8 pg (ref 26.0–34.0)
MCHC: 35.4 g/dL (ref 30.0–36.0)
MCV: 84.3 fL (ref 78.0–100.0)
Monocytes Absolute: 0.5 10*3/uL (ref 0.1–1.0)
Monocytes Relative: 7 % (ref 3–12)
Neutro Abs: 4.5 10*3/uL (ref 1.7–7.7)
Neutrophils Relative %: 65 % (ref 43–77)
Platelets: 254 10*3/uL (ref 150–400)
RBC: 4.83 MIL/uL (ref 4.22–5.81)
RDW: 13.6 % (ref 11.5–15.5)
WBC: 6.9 10*3/uL (ref 4.0–10.5)

## 2011-10-28 MED ORDER — HYDROMORPHONE HCL PF 1 MG/ML IJ SOLN
1.0000 mg | INTRAMUSCULAR | Status: AC
  Administered 2011-10-28: 1 mg via INTRAVENOUS
  Filled 2011-10-28: qty 1

## 2011-10-28 MED ORDER — ONDANSETRON HCL 4 MG/2ML IJ SOLN
4.0000 mg | Freq: Once | INTRAMUSCULAR | Status: AC
  Administered 2011-10-28: 4 mg via INTRAVENOUS
  Filled 2011-10-28: qty 2

## 2011-10-28 MED ORDER — SODIUM CHLORIDE 0.9 % IV BOLUS (SEPSIS)
1000.0000 mL | Freq: Once | INTRAVENOUS | Status: AC
  Administered 2011-10-28: 1000 mL via INTRAVENOUS

## 2011-10-28 MED ORDER — MORPHINE SULFATE 4 MG/ML IJ SOLN
6.0000 mg | Freq: Once | INTRAMUSCULAR | Status: AC
  Administered 2011-10-28: 6 mg via INTRAVENOUS
  Filled 2011-10-28: qty 2

## 2011-10-28 MED ORDER — MORPHINE SULFATE 4 MG/ML IJ SOLN
4.0000 mg | Freq: Once | INTRAMUSCULAR | Status: AC
  Administered 2011-10-28: 4 mg via INTRAVENOUS
  Filled 2011-10-28: qty 1

## 2011-10-28 MED ORDER — IOHEXOL 300 MG/ML  SOLN
75.0000 mL | Freq: Once | INTRAMUSCULAR | Status: AC | PRN
  Administered 2011-10-28: 75 mL via INTRAVENOUS

## 2011-10-28 MED ORDER — PENICILLIN V POTASSIUM 500 MG PO TABS: 500.0000 mg | ORAL_TABLET | Freq: Four times a day (QID) | ORAL | Status: DC

## 2011-10-28 MED ORDER — METOCLOPRAMIDE HCL 5 MG/ML IJ SOLN
10.0000 mg | Freq: Once | INTRAMUSCULAR | Status: AC
  Administered 2011-10-28: 10 mg via INTRAVENOUS
  Filled 2011-10-28: qty 2

## 2011-10-28 MED ORDER — MORPHINE SULFATE 4 MG/ML IJ SOLN
2.0000 mg | Freq: Once | INTRAMUSCULAR | Status: AC
  Administered 2011-10-28: 2 mg via INTRAVENOUS
  Filled 2011-10-28: qty 1

## 2011-10-28 MED ORDER — HYDROCODONE-ACETAMINOPHEN 5-325 MG PO TABS: 1.0000 | ORAL_TABLET | ORAL | Status: DC | PRN

## 2011-10-28 MED ORDER — MORPHINE SULFATE 4 MG/ML IJ SOLN: 6.0000 mg | Freq: Once | INTRAMUSCULAR | Status: DC

## 2011-10-28 NOTE — ED Notes (Signed)
Headache and neck pain, no rigidity.  Pt has had this for 4 days, not associated with any trauma.  Pt also has nausea and vomiting with this

## 2011-10-28 NOTE — ED Provider Notes (Signed)
Patient to move to CDU holding for CT scan of neck.  Sign out received from Dr Ethelda Chick.  Pt having left neck pain, worse with movement.  Question of fevers, dizziness, vomiting.  If CT is negative, pt to be d/c home with symptomatic medications, PCP follow up.   5:57 PM Patient seen and examined.  Pt has 6/10 pain in left neck currently.  CT shows priminent lymphadenopathy.  Pt notes he does have a sore throat and left ear pain.  Pt is A&O, NAD, pharynx is erythematous with some edema, right ear canal with cerumen, left TM normal, diffuse tenderness of anterior neck, L>R, RRR, CTAB.   Discussed findings and plan with Dr Ethelda Chick who agrees with d/c home with antibiotics, pain medication, PCP follow up.    Discussed CT results, treatment plan, and follow up with patient.  Pt given return precautions.  Pt verbalizes understanding and agrees with plan.     Results for orders placed during the hospital encounter of 10/28/11  BASIC METABOLIC PANEL      Component Value Range   Sodium 140  135 - 145 mEq/L   Potassium 3.9  3.5 - 5.1 mEq/L   Chloride 99  96 - 112 mEq/L   CO2 29  19 - 32 mEq/L   Glucose, Bld 91  70 - 99 mg/dL   BUN 11  6 - 23 mg/dL   Creatinine, Ser 1.61  0.50 - 1.35 mg/dL   Calcium 9.7  8.4 - 09.6 mg/dL   GFR calc non Af Amer >90  >90 mL/min   GFR calc Af Amer >90  >90 mL/min  CBC WITH DIFFERENTIAL      Component Value Range   WBC 6.9  4.0 - 10.5 K/uL   RBC 4.83  4.22 - 5.81 MIL/uL   Hemoglobin 14.4  13.0 - 17.0 g/dL   HCT 04.5  40.9 - 81.1 %   MCV 84.3  78.0 - 100.0 fL   MCH 29.8  26.0 - 34.0 pg   MCHC 35.4  30.0 - 36.0 g/dL   RDW 91.4  78.2 - 95.6 %   Platelets 254  150 - 400 K/uL   Neutrophils Relative 65  43 - 77 %   Neutro Abs 4.5  1.7 - 7.7 K/uL   Lymphocytes Relative 27  12 - 46 %   Lymphs Abs 1.8  0.7 - 4.0 K/uL   Monocytes Relative 7  3 - 12 %   Monocytes Absolute 0.5  0.1 - 1.0 K/uL   Eosinophils Relative 1  0 - 5 %   Eosinophils Absolute 0.1  0.0 - 0.7  K/uL   Basophils Relative 0  0 - 1 %   Basophils Absolute 0.0  0.0 - 0.1 K/uL   Ct Soft Tissue Neck W Contrast  10/28/2011  *RADIOLOGY REPORT*  Clinical Data: Left-sided neck pain.  Dizziness.  The patient reports pain when turning his head to the left.  CT NECK WITH CONTRAST  Technique:  Multidetector CT imaging of the neck was performed with intravenous contrast.  Contrast: 75mL OMNIPAQUE IOHEXOL 300 MG/ML  SOLN  Comparison: None.  Findings: Mild fullness of the palatine tonsils is present bilaterally.  No discrete mass lesion is evident. No other focal mucosal or submucosal lesion is evident.  Vocal cords are midline. The thyroid is within normal limits.  At least 2 15 mm left level II lymph nodes are present.  These are somewhat rounded.  Other smaller posterior left level II  lymph nodes are present.  There is no significant lower adenopathy. Borderline right level II lymph nodes are present.  No other right- sided cervical adenopathy is present.  Ground-glass attenuation at the lung apices is compatible with edema or atelectasis.  The bone windows demonstrate focal endplate degenerative changes at C5-6 and C6-7.  IMPRESSION:  1.  Prominent level II lymph nodes bilaterally, left greater than right.  The left sided nodes are somewhat rounded.  Without a definitive primary, these are still most likely reactive, but neoplasm is not excluded. 2.  Fullness of the palatine tonsils bilaterally.  Recommend direct visualization to evaluate for inflammatory versus neoplastic disease. 3.  Mild spondylosis of the lower cervical spine.   Original Report Authenticated By: Jamesetta Orleans. MATTERN, M.D.       [place], [place] 10/28/11 2146

## 2011-10-28 NOTE — ED Provider Notes (Signed)
History     CSN: [id number]  Arrival date & time 10/28/11  1136   First MD Initiated Contact with Patient 10/28/11 1503      Chief Complaint  Patient presents with  . Neck Pain    (Consider location/radiation/quality/duration/timing/severity/associated sxs/prior treatment) HPI Complains of left-sided posterolateral neck pain onset 4 days ago. Accompanied by dizziness i.e. lightheadedness nausea and vomiting questionable subjective fever treated himself with aspirin, Relpax and Reglan. Nausea improved since treatment with Reglan. Neck pain not improved with aspirin. Does not feel like a migraine he's had in the past. No chest pain no shortness of breath no other complaint the pain is worse with rotating his head to the left improved with rotating his head to the right. Last vomited 3 hours ago. No other associated symptoms pain is moderate at present Past Medical History  Diagnosis Date  . DDD (degenerative disc disease)   . Shingles   . Renal calculi   . Migraines     Past Surgical History  Procedure Date  . Lithotripsy     No family history on file.  History  Substance Use Topics  . Smoking status: Never Smoker   . Smokeless tobacco: Not on file  . Alcohol Use: No     rarely      Review of Systems  Constitutional: Positive for fever.  HENT: Positive for neck pain.   Gastrointestinal: Positive for nausea and vomiting.  Neurological: Positive for dizziness.  All other systems reviewed and are negative.    Allergies  Erythromycin  Home Medications   Current Outpatient Rx  Name Route Sig Dispense Refill  . RELPAX PO Oral Take 1 tablet by mouth as needed. For migraines    . IMIPRAMINE HCL 50 MG PO TABS Oral Take 50 mg by mouth at bedtime.    Marland Kitchen PROMETHAZINE HCL 25 MG PO TABS Oral Take 25 mg by mouth every 6 (six) hours as needed. For nausea    . PROPRANOLOL HCL 20 MG PO TABS Oral Take 20 mg by mouth 2 (two) times daily.    . SERTRALINE HCL 50 MG PO TABS Oral  Take 50 mg by mouth 2 (two) times daily.      BP 173/97  Pulse 102  Temp 98.5 F (36.9 C) (Oral)  Resp 16  SpO2 97%  Physical Exam  Nursing note and vitals reviewed. Constitutional: He is oriented to person, place, and time. He appears well-developed and well-nourished. No distress.  HENT:  Head: Normocephalic and atraumatic.  Right Ear: External ear normal.  Left Ear: External ear normal.  Nose: Nose normal.  Mouth/Throat: Oropharynx is clear and moist. No oropharyngeal exudate.       Bilateral tympanic membranes normal  Eyes: Conjunctivae normal are normal. Pupils are equal, round, and reactive to light.  Neck: Neck supple. No tracheal deviation present. No thyromegaly present.       No signs of meningitis. Neck pain is exacerbated by rotating head to left  Cardiovascular: Normal rate and regular rhythm.   No murmur heard. Pulmonary/Chest: Effort normal and breath sounds normal.  Abdominal: Soft. Bowel sounds are normal. He exhibits no distension. There is no tenderness.       Obese  Musculoskeletal: Normal range of motion. He exhibits no edema and no tenderness.       Gait normal Romberg normal pronator drift normal  Neurological: He is alert and oriented to person, place, and time. He has normal reflexes. No cranial nerve deficit. Coordination normal.  Skin: Skin is warm and dry. No rash noted.  Psychiatric: He has a normal mood and affect.    ED Course  Procedures (including critical care time)   Labs Reviewed  BASIC METABOLIC PANEL  CBC WITH DIFFERENTIAL   No results found.   No diagnosis found.  Results for orders placed during the hospital encounter of 10/28/11  BASIC METABOLIC PANEL      Component Value Range   Sodium 140  135 - 145 mEq/L   Potassium 3.9  3.5 - 5.1 mEq/L   Chloride 99  96 - 112 mEq/L   CO2 29  19 - 32 mEq/L   Glucose, Bld 91  70 - 99 mg/dL   BUN 11  6 - 23 mg/dL   Creatinine, Ser 1.61  0.50 - 1.35 mg/dL   Calcium 9.7  8.4 - 09.6  mg/dL   GFR calc non Af Amer >90  >90 mL/min   GFR calc Af Amer >90  >90 mL/min  CBC WITH DIFFERENTIAL      Component Value Range   WBC 6.9  4.0 - 10.5 K/uL   RBC 4.83  4.22 - 5.81 MIL/uL   Hemoglobin 14.4  13.0 - 17.0 g/dL   HCT 04.5  40.9 - 81.1 %   MCV 84.3  78.0 - 100.0 fL   MCH 29.8  26.0 - 34.0 pg   MCHC 35.4  30.0 - 36.0 g/dL   RDW 91.4  78.2 - 95.6 %   Platelets 254  150 - 400 K/uL   Neutrophils Relative 65  43 - 77 %   Neutro Abs 4.5  1.7 - 7.7 K/uL   Lymphocytes Relative 27  12 - 46 %   Lymphs Abs 1.8  0.7 - 4.0 K/uL   Monocytes Relative 7  3 - 12 %   Monocytes Absolute 0.5  0.1 - 1.0 K/uL   Eosinophils Relative 1  0 - 5 %   Eosinophils Absolute 0.1  0.0 - 0.7 K/uL   Basophils Relative 0  0 - 1 %   Basophils Absolute 0.0  0.0 - 0.1 K/uL   Ct Soft Tissue Neck W Contrast  10/28/2011  *RADIOLOGY REPORT*  Clinical Data: Left-sided neck pain.  Dizziness.  The patient reports pain when turning his head to the left.  CT NECK WITH CONTRAST  Technique:  Multidetector CT imaging of the neck was performed with intravenous contrast.  Contrast: 75mL OMNIPAQUE IOHEXOL 300 MG/ML  SOLN  Comparison: None.  Findings: Mild fullness of the palatine tonsils is present bilaterally.  No discrete mass lesion is evident. No other focal mucosal or submucosal lesion is evident.  Vocal cords are midline. The thyroid is within normal limits.  At least 2 15 mm left level II lymph nodes are present.  These are somewhat rounded.  Other smaller posterior left level II lymph nodes are present.  There is no significant lower adenopathy. Borderline right level II lymph nodes are present.  No other right- sided cervical adenopathy is present.  Ground-glass attenuation at the lung apices is compatible with edema or atelectasis.  The bone windows demonstrate focal endplate degenerative changes at C5-6 and C6-7.  IMPRESSION:  1.  Prominent level II lymph nodes bilaterally, left greater than right.  The left sided nodes  are somewhat rounded.  Without a definitive primary, these are still most likely reactive, but neoplasm is not excluded. 2.  Fullness of the palatine tonsils bilaterally.  Recommend direct visualization to evaluate for inflammatory  versus neoplastic disease. 3.  Mild spondylosis of the lower cervical spine.   Original Report Authenticated By: Jamesetta Orleans. MATTERN, M.D.      MDM  To obtain CT scan of neck to rule out abscess or underlying pathology. Patient transferred to CDU        Doug Sou, MD 10/28/11 2353

## 2011-10-28 NOTE — ED Notes (Signed)
Per MD- pt has been c/o posterior neck pain. Began having n/v, no fevers.

## 2011-10-28 NOTE — ED Provider Notes (Signed)
Medical screening examination/treatment/procedure(s) were conducted as a shared visit with non-physician practitioner(s) and myself.  I personally evaluated the patient during the encounter  Doug Sou, MD 10/28/11 2351

## 2011-10-28 NOTE — ED Notes (Signed)
Pt complains of pain in his left neck from under his left ear forward. No major swelling appreciated. States painful to turn head to the left, but not to the right. States throat "not really sore", denies fever. Denies diff swallowing. Onset 4 days ago

## 2012-02-08 ENCOUNTER — Emergency Department (HOSPITAL_COMMUNITY)
Admission: EM | Admit: 2012-02-08 | Discharge: 2012-02-08 | Disposition: A | Discharge status: Home / Self Care | Payer: 59 | Attending: Emergency Medicine | Admitting: Emergency Medicine

## 2012-02-08 ENCOUNTER — Encounter (HOSPITAL_COMMUNITY): Payer: Self-pay

## 2012-02-08 ENCOUNTER — Emergency Department (HOSPITAL_COMMUNITY): Payer: 59

## 2012-02-08 DIAGNOSIS — H53149 Visual discomfort, unspecified: Secondary | ICD-10-CM | POA: Insufficient documentation

## 2012-02-08 DIAGNOSIS — R112 Nausea with vomiting, unspecified: Secondary | ICD-10-CM | POA: Insufficient documentation

## 2012-02-08 DIAGNOSIS — G43909 Migraine, unspecified, not intractable, without status migrainosus: Secondary | ICD-10-CM | POA: Insufficient documentation

## 2012-02-08 DIAGNOSIS — R51 Headache: Secondary | ICD-10-CM | POA: Insufficient documentation

## 2012-02-08 DIAGNOSIS — M542 Cervicalgia: Secondary | ICD-10-CM | POA: Insufficient documentation

## 2012-02-08 DIAGNOSIS — Z8739 Personal history of other diseases of the musculoskeletal system and connective tissue: Secondary | ICD-10-CM | POA: Insufficient documentation

## 2012-02-08 DIAGNOSIS — R42 Dizziness and giddiness: Secondary | ICD-10-CM | POA: Insufficient documentation

## 2012-02-08 DIAGNOSIS — Z87442 Personal history of urinary calculi: Secondary | ICD-10-CM | POA: Insufficient documentation

## 2012-02-08 DIAGNOSIS — Z8619 Personal history of other infectious and parasitic diseases: Secondary | ICD-10-CM | POA: Insufficient documentation

## 2012-02-08 DIAGNOSIS — Z79899 Other long term (current) drug therapy: Secondary | ICD-10-CM | POA: Insufficient documentation

## 2012-02-08 MED ORDER — METOCLOPRAMIDE HCL 5 MG/ML IJ SOLN
10.0000 mg | Freq: Once | INTRAMUSCULAR | Status: AC
  Administered 2012-02-08: 10 mg via INTRAVENOUS
  Filled 2012-02-08: qty 2

## 2012-02-08 MED ORDER — DIPHENHYDRAMINE HCL 50 MG/ML IJ SOLN
12.5000 mg | Freq: Once | INTRAMUSCULAR | Status: AC
  Administered 2012-02-08: 12.5 mg via INTRAVENOUS
  Filled 2012-02-08: qty 1

## 2012-02-08 MED ORDER — ONDANSETRON HCL 4 MG/2ML IJ SOLN
4.0000 mg | Freq: Once | INTRAMUSCULAR | Status: AC
  Administered 2012-02-08: 4 mg via INTRAVENOUS
  Filled 2012-02-08: qty 2

## 2012-02-08 MED ORDER — FENTANYL CITRATE 0.05 MG/ML IJ SOLN
50.0000 ug | Freq: Once | INTRAMUSCULAR | Status: AC
  Administered 2012-02-08: 50 ug via INTRAVENOUS
  Filled 2012-02-08: qty 2

## 2012-02-08 MED ORDER — KETOROLAC TROMETHAMINE 30 MG/ML IJ SOLN
30.0000 mg | Freq: Once | INTRAMUSCULAR | Status: AC
  Administered 2012-02-08: 30 mg via INTRAVENOUS
  Filled 2012-02-08: qty 1

## 2012-02-08 MED ORDER — SODIUM CHLORIDE 0.9 % IV BOLUS (SEPSIS)
500.0000 mL | Freq: Once | INTRAVENOUS | Status: AC
  Administered 2012-02-08: 500 mL via INTRAVENOUS

## 2012-02-08 MED ORDER — PROMETHAZINE HCL 25 MG PO TABS: 25.0000 mg | ORAL_TABLET | Freq: Four times a day (QID) | ORAL | Status: DC | PRN

## 2012-02-08 MED ORDER — SODIUM CHLORIDE 0.9 % IV BOLUS (SEPSIS)
1000.0000 mL | Freq: Once | INTRAVENOUS | Status: AC
  Administered 2012-02-08: 1000 mL via INTRAVENOUS

## 2012-02-08 NOTE — ED Provider Notes (Signed)
History  This chart was scribed for Juan Hutching, MD by Shari Heritage, ED Scribe. The patient was seen in room APA07/APA07. Patient's care was started at 1623.  CSN: [id number]  Arrival date & time 02/08/12  1618   First MD Initiated Contact with Patient 02/08/12 1623      Chief Complaint  Patient presents with  . Headache  . Emesis  . Dizziness     The history is provided by the patient. No language interpreter was used.    HPI Comments: [name] is a 37 y.o. male who presents to the Emergency Department complaining of constant, severe, dull bifrontal and bitemporal headache onset this morning. Patient states that the headache began early this morning then began to rapidly worsen 4.5 hours ago. Other symptoms include slight photophobia, posterior neck pain, nausea, emesis and dizziness. Patient states that this headache is atypical. He says that he has a history of migraines, but the location, severity and associated symptoms of this headache are different, worse. Patient denies decreased ROM of extremities, numbness or weakness. He is ambulatory. He denies history of diabetes or hypertension.   Past Medical History  Diagnosis Date  . DDD (degenerative disc disease)   . Shingles   . Renal calculi   . Migraines     Past Surgical History  Procedure Date  . Lithotripsy     No family history on file.  History  Substance Use Topics  . Smoking status: Never Smoker   . Smokeless tobacco: Not on file  . Alcohol Use: No     Comment: rarely      Review of Systems A complete 10 system review of systems was obtained and all systems are negative except as noted in the HPI and PMH.   Allergies  Erythromycin  Home Medications   Current Outpatient Rx  Name  Route  Sig  Dispense  Refill  . RELPAX PO   Oral   Take 1 tablet by mouth as needed. For migraines         . HYDROCODONE-ACETAMINOPHEN 5-325 MG PO TABS   Oral   Take 1-2 tablets by mouth every 4 (four) hours as  needed for pain.   20 tablet   0   . IMIPRAMINE HCL 50 MG PO TABS   Oral   Take 50 mg by mouth at bedtime.         Marland Kitchen PENICILLIN V POTASSIUM 500 MG PO TABS   Oral   Take 1 tablet (500 mg total) by mouth 4 (four) times daily.   40 tablet   0   . PROMETHAZINE HCL 25 MG PO TABS   Oral   Take 25 mg by mouth every 6 (six) hours as needed. For nausea         . PROPRANOLOL HCL 20 MG PO TABS   Oral   Take 20 mg by mouth 2 (two) times daily.         . SERTRALINE HCL 50 MG PO TABS   Oral   Take 50 mg by mouth 2 (two) times daily.           Triage Vitals: BP 160/116  Pulse 87  Temp 97.6 F (36.4 C) (Oral)  Resp 18  Ht 5\' 10"  (1.778 m)  Wt 200 lb (90.719 kg)  BMI 28.70 kg/m2  SpO2 100%  Physical Exam  Nursing note and vitals reviewed. Constitutional: He is oriented to person, place, and time. He appears well-developed and well-nourished.  HENT:  Head: Normocephalic and atraumatic.  Eyes: Conjunctivae normal and EOM are normal. Pupils are equal, round, and reactive to light.  Neck: Normal range of motion. Neck supple.  Cardiovascular: Normal rate, regular rhythm and normal heart sounds.   Pulmonary/Chest: Effort normal and breath sounds normal.  Abdominal: Soft. Bowel sounds are normal.  Musculoskeletal: Normal range of motion.  Neurological: He is alert and oriented to person, place, and time.  Skin: Skin is warm and dry.  Psychiatric: He has a normal mood and affect.    ED Course  Procedures (including critical care time) DIAGNOSTIC STUDIES: Oxygen Saturation is 100% on room air, normal by my interpretation.    COORDINATION OF CARE: 4:29 PM- Will order a head CT and give pain medicines. Patient informed of current plan for treatment and evaluation and agrees with plan at this time.     Ct Head Wo Contrast  02/08/2012  *RADIOLOGY REPORT*  Clinical Data: Headaches and dizziness.  CT HEAD WITHOUT CONTRAST  Technique:  Contiguous axial images were obtained  from the base of the skull through the vertex without contrast.  Comparison: None.  Findings: The brain appears normal without infarct, hemorrhage, mass lesion, mass effect, midline shift or abnormal extra-axial fluid collection.  No hydrocephalus or pneumocephalus.  IMPRESSION: Negative exam.   Original Report Authenticated By: Holley Dexter, M.D.      No diagnosis found.    MDM  Patient feeling much better after IV fluids and medication. No neuro deficits at discharge. CT head negative. Blood pressure has normalized      I personally performed the services described in this documentation, which was scribed in my presence. The recorded information has been reviewed and is accurate.    Juan Hutching, MD 02/08/12 2033

## 2012-02-08 NOTE — ED Notes (Signed)
Pt reports woke up this am feeling a little dizzy.  Reports has had headache since 11am and has vomited multiple times.  Reports has history of migraines but says this doesn't feel like a migraine and is the worst headache he has ever had.  Also reports some difficulty speaking.

## 2012-02-09 ENCOUNTER — Ambulatory Visit
Admission: RE | Admit: 2012-02-09 | Discharge: 2012-02-09 | Disposition: A | Discharge status: Home / Self Care | Payer: 59 | Source: Ambulatory Visit | Attending: Family Medicine | Admitting: Family Medicine

## 2012-02-09 ENCOUNTER — Other Ambulatory Visit: Payer: Self-pay | Admitting: Family Medicine

## 2012-02-09 DIAGNOSIS — R599 Enlarged lymph nodes, unspecified: Secondary | ICD-10-CM

## 2012-02-20 ENCOUNTER — Other Ambulatory Visit: Payer: Self-pay | Admitting: Family Medicine

## 2012-02-20 DIAGNOSIS — M542 Cervicalgia: Secondary | ICD-10-CM

## 2012-02-23 ENCOUNTER — Ambulatory Visit
Admission: RE | Admit: 2012-02-23 | Discharge: 2012-02-23 | Disposition: A | Discharge status: Home / Self Care | Payer: 59 | Source: Ambulatory Visit | Attending: Family Medicine | Admitting: Family Medicine

## 2012-02-23 ENCOUNTER — Other Ambulatory Visit: Payer: 59

## 2012-02-23 DIAGNOSIS — M542 Cervicalgia: Secondary | ICD-10-CM

## 2012-02-23 MED ORDER — IOHEXOL 300 MG/ML  SOLN
75.0000 mL | Freq: Once | INTRAMUSCULAR | Status: AC | PRN
  Administered 2012-02-23: 75 mL via INTRAVENOUS

## 2012-03-03 ENCOUNTER — Other Ambulatory Visit: Payer: Self-pay

## 2012-03-07 ENCOUNTER — Telehealth (INDEPENDENT_AMBULATORY_CARE_PROVIDER_SITE_OTHER): Payer: Self-pay | Admitting: General Surgery

## 2012-03-07 ENCOUNTER — Ambulatory Visit (INDEPENDENT_AMBULATORY_CARE_PROVIDER_SITE_OTHER): Payer: Commercial Managed Care - PPO | Admitting: General Surgery

## 2012-03-07 ENCOUNTER — Encounter (INDEPENDENT_AMBULATORY_CARE_PROVIDER_SITE_OTHER): Payer: Self-pay | Admitting: General Surgery

## 2012-03-07 VITALS — BP 126/78 | HR 82 | Temp 97.4°F | Resp 16 | Ht 70.0 in | Wt 204.0 lb

## 2012-03-07 DIAGNOSIS — R590 Localized enlarged lymph nodes: Secondary | ICD-10-CM

## 2012-03-07 DIAGNOSIS — R599 Enlarged lymph nodes, unspecified: Secondary | ICD-10-CM

## 2012-03-07 NOTE — Telephone Encounter (Signed)
Spoke with patient with appt date 03/22/12 at 3:15 pm [facility] [place]

## 2012-03-07 NOTE — Progress Notes (Signed)
Patient ID: [name], male   DOB: 1975-07-02, 37 y.o.   MRN: [id number]  Chief Complaint  Patient presents with  . Mass    Evaluate cervical adenopathy    HPI [name] is a 37 y.o. male. This patient is referred by Carilyn Goodpasture for evaluation of cervical lymphadenopathy. He was in his usual state of health until about the October timeframe wave he began having neck pain and he was evaluated at the hospital for possible meningitis. He had a CT scan which demonstrated some cervical lymphadenopathy and was treated with antibiotics but since then he has not received any improvement. He has had a followup CT scan in about 2 weeks ago which demonstrated stable mildly enlarged cervical lymphadenopathy. He continues to have left-sided neck and got pain which has not improved with 2 rounds of antibiotics. He takes Motrin and Tylenol for relief of his discomfort but he also has some associated headache which is different than his history of migraines. He says he has some chills but no fevers and no weight loss. He says he has had some night sweats but not any soaking sweats.  HPI  Past Medical History  Diagnosis Date  . DDD (degenerative disc disease)   . Shingles   . Renal calculi   . Migraines   . Hypertension   . IBS (irritable bowel syndrome)     Past Surgical History  Procedure Laterality Date  . Lithotripsy      No family history on file.  Social History History  Substance Use Topics  . Smoking status: Never Smoker   . Smokeless tobacco: Not on file  . Alcohol Use: No     Comment: rarely    Allergies  Allergen Reactions  . Erythromycin Nausea And Vomiting    Current Outpatient Prescriptions  Medication Sig Dispense Refill  . Eletriptan Hydrobromide (RELPAX PO) Take 1 tablet by mouth as needed. For migraines      . imipramine (TOFRANIL) 50 MG tablet Take 50 mg by mouth at bedtime.      . promethazine (PHENERGAN) 25 MG tablet Take 1 tablet (25 mg total) by mouth  every 6 (six) hours as needed for nausea.  20 tablet  0  . propranolol (INDERAL) 20 MG tablet Take 20 mg by mouth 2 (two) times daily.      . sertraline (ZOLOFT) 50 MG tablet Take 50 mg by mouth 2 (two) times daily.      . promethazine (PHENERGAN) 25 MG tablet Take 1 tablet (25 mg total) by mouth every 6 (six) hours as needed for nausea.  12 tablet  0   No current facility-administered medications for this visit.    Review of Systems Review of Systems All other review of systems negative or noncontributory except as stated in the HPI  Blood pressure 126/78, pulse 82, temperature 97.4 F (36.3 C), temperature source Temporal, resp. rate 16, height 5\' 10"  (1.778 m), weight 204 lb (92.534 kg).  Physical Exam Physical Exam Physical Exam  Vitals reviewed. Constitutional: He is oriented to person, place, and time. He appears well-developed and well-nourished. No distress.  HENT:  Head: Normocephalic and atraumatic.  Mouth/Throat: No oropharyngeal exudate. no lesions Eyes: Conjunctivae and EOM are normal. Pupils are equal, round, and reactive to light. Right eye exhibits no discharge. Left eye exhibits no discharge. No scleral icterus.  Neck: Normal range of motion. No tracheal deviation present. No palpable LAD, mild tenderness at the angle of the left mandible. Cardiovascular:  Normal rate, regular rhythm and normal heart sounds.   Pulmonary/Chest: Effort normal and breath sounds normal. No stridor. No respiratory distress. He has no wheezes. He has no rales. He exhibits no tenderness.  Abdominal: Soft. Bowel sounds are normal. He exhibits no distension and no mass. There is no tenderness. There is no rebound and no guarding.  Musculoskeletal: Normal range of motion. He exhibits no edema and no tenderness.  Neurological: He is alert and oriented to person, place, and time.  Skin: Skin is warm and dry. No rash noted. He is not diaphoretic. No erythema. No pallor.  Psychiatric: He has a normal  mood and affect. His behavior is normal. Judgment and thought content normal.  Lymph: no palpable LAD in the cervical, axillary, and groin regions  Data Reviewed CT  Assessment    Lymphadenopathy and neck pain He does have some cervical lymphadenopathy of both CT scans but not on physical exam. This appears to be stable over the last 3 months. However, his symptoms persist. I do not know what could be causing his symptoms and I do not feel any palpable lymphadenopathy for possible biopsy. If biopsy is necessary, then I would recommend possibly image guided biopsy of the lymphadenopathy. This also may be related to TMJ or 2 his allergies or other ear problems. I would recommend an ENT evaluation for their input as well. I have placed a referral for ENT evaluation and would recommend followup CT scan in 6 months to reevaluate lymphadenopathy. At this time I do not appreciate anything that I can biopsy. This could represent lymphoma or other malignancy but he does not have any other symptoms of this.      Plan    ENT referral Followup CT scan in 6 months would be recommended        Jaicey Sweaney DAVID 03/07/2012, 1:39 PM

## 2012-04-05 ENCOUNTER — Emergency Department (HOSPITAL_COMMUNITY)
Admission: EM | Admit: 2012-04-05 | Discharge: 2012-04-06 | Disposition: A | Discharge status: Home / Self Care | Payer: 59 | Attending: Emergency Medicine | Admitting: Emergency Medicine

## 2012-04-05 ENCOUNTER — Encounter (HOSPITAL_COMMUNITY): Payer: Self-pay | Admitting: Emergency Medicine

## 2012-04-05 ENCOUNTER — Emergency Department (HOSPITAL_COMMUNITY): Payer: 59

## 2012-04-05 DIAGNOSIS — Z79899 Other long term (current) drug therapy: Secondary | ICD-10-CM | POA: Insufficient documentation

## 2012-04-05 DIAGNOSIS — G43909 Migraine, unspecified, not intractable, without status migrainosus: Secondary | ICD-10-CM | POA: Insufficient documentation

## 2012-04-05 DIAGNOSIS — R079 Chest pain, unspecified: Secondary | ICD-10-CM

## 2012-04-05 DIAGNOSIS — Z8619 Personal history of other infectious and parasitic diseases: Secondary | ICD-10-CM | POA: Insufficient documentation

## 2012-04-05 DIAGNOSIS — I10 Essential (primary) hypertension: Secondary | ICD-10-CM | POA: Insufficient documentation

## 2012-04-05 DIAGNOSIS — Z8719 Personal history of other diseases of the digestive system: Secondary | ICD-10-CM | POA: Insufficient documentation

## 2012-04-05 DIAGNOSIS — R072 Precordial pain: Secondary | ICD-10-CM | POA: Insufficient documentation

## 2012-04-05 DIAGNOSIS — R51 Headache: Secondary | ICD-10-CM | POA: Insufficient documentation

## 2012-04-05 DIAGNOSIS — Z87442 Personal history of urinary calculi: Secondary | ICD-10-CM | POA: Insufficient documentation

## 2012-04-05 DIAGNOSIS — Z8739 Personal history of other diseases of the musculoskeletal system and connective tissue: Secondary | ICD-10-CM | POA: Insufficient documentation

## 2012-04-05 LAB — POCT I-STAT TROPONIN I: Troponin i, poc: 0 ng/mL (ref 0.00–0.08)

## 2012-04-05 LAB — COMPREHENSIVE METABOLIC PANEL
ALT: 20 U/L (ref 0–53)
AST: 22 U/L (ref 0–37)
Albumin: 4.3 g/dL (ref 3.5–5.2)
Alkaline Phosphatase: 102 U/L (ref 39–117)
BUN: 15 mg/dL (ref 6–23)
CO2: 26 mEq/L (ref 19–32)
Calcium: 10 mg/dL (ref 8.4–10.5)
Chloride: 100 mEq/L (ref 96–112)
Creatinine, Ser: 0.83 mg/dL (ref 0.50–1.35)
GFR calc Af Amer: >90 mL/min (ref >90)
GFR calc non Af Amer: >90 mL/min (ref >90)
Glucose, Bld: 97 mg/dL (ref 70–99)
Potassium: 3.9 mEq/L (ref 3.5–5.1)
Sodium: 139 mEq/L (ref 135–145)
Total Bilirubin: 0.6 mg/dL (ref 0.3–1.2)
Total Protein: 8.3 g/dL (ref 6.0–8.3)

## 2012-04-05 LAB — CBC WITH DIFFERENTIAL/PLATELET
Basophils Absolute: 0 10*3/uL (ref 0.0–0.1)
Basophils Relative: 1 % (ref 0–1)
Eosinophils Absolute: 0.1 10*3/uL (ref 0.0–0.7)
Eosinophils Relative: 1 % (ref 0–5)
HCT: 44.5 % (ref 39.0–52.0)
Hemoglobin: 16.3 g/dL (ref 13.0–17.0)
Lymphocytes Relative: 27 % (ref 12–46)
Lymphs Abs: 2.2 10*3/uL (ref 0.7–4.0)
MCH: 30.6 pg (ref 26.0–34.0)
MCHC: 36.6 g/dL — ABNORMAL HIGH (ref 30.0–36.0)
MCV: 83.6 fL (ref 78.0–100.0)
Monocytes Absolute: 0.5 10*3/uL (ref 0.1–1.0)
Monocytes Relative: 5 % (ref 3–12)
Neutro Abs: 5.5 10*3/uL (ref 1.7–7.7)
Neutrophils Relative %: 66 % (ref 43–77)
Platelets: 328 10*3/uL (ref 150–400)
RBC: 5.32 MIL/uL (ref 4.22–5.81)
RDW: 13.5 % (ref 11.5–15.5)
WBC: 8.4 10*3/uL (ref 4.0–10.5)

## 2012-04-05 MED ORDER — SODIUM CHLORIDE 0.9 % IV BOLUS (SEPSIS)
500.0000 mL | Freq: Once | INTRAVENOUS | Status: AC
  Administered 2012-04-05: 500 mL via INTRAVENOUS

## 2012-04-05 MED ORDER — MORPHINE SULFATE 4 MG/ML IJ SOLN
4.0000 mg | Freq: Once | INTRAMUSCULAR | Status: AC
  Administered 2012-04-05: 4 mg via INTRAVENOUS
  Filled 2012-04-05: qty 1

## 2012-04-05 MED ORDER — METOCLOPRAMIDE HCL 5 MG/ML IJ SOLN
10.0000 mg | Freq: Once | INTRAMUSCULAR | Status: AC
  Administered 2012-04-05: 10 mg via INTRAVENOUS
  Filled 2012-04-05: qty 2

## 2012-04-05 MED ORDER — DIPHENHYDRAMINE HCL 50 MG/ML IJ SOLN
25.0000 mg | Freq: Once | INTRAMUSCULAR | Status: AC
  Administered 2012-04-05: 25 mg via INTRAVENOUS
  Filled 2012-04-05: qty 1

## 2012-04-05 NOTE — ED Provider Notes (Signed)
History     CSN: [id number]  Arrival date & time 04/05/12  1858   First MD Initiated Contact with Patient 04/05/12 2258      Chief Complaint  Patient presents with  . Chest Pain    (Consider location/radiation/quality/duration/timing/severity/associated sxs/prior treatment) Patient is a 37 y.o. male presenting with chest pain. The history is provided by the patient.  Chest Pain Pain location:  Substernal area Pain radiates to:  Does not radiate Pain radiates to the back: no   Pain severity:  Mild Onset quality:  Gradual Duration:  1 day Timing:  Constant Progression:  Resolved Chronicity:  New Context: stress   Relieved by:  Nothing Worsened by:  Nothing tried Ineffective treatments:  None tried Associated symptoms: headache     Past Medical History  Diagnosis Date  . DDD (degenerative disc disease)   . Shingles   . Renal calculi   . Migraines   . Hypertension   . IBS (irritable bowel syndrome)     Past Surgical History  Procedure Laterality Date  . Lithotripsy      No family history on file.  History  Substance Use Topics  . Smoking status: Never Smoker   . Smokeless tobacco: Not on file  . Alcohol Use: No     Comment: rarely      Review of Systems  Cardiovascular: Positive for chest pain.  Neurological: Positive for headaches.  All other systems reviewed and are negative.    Allergies  Erythromycin  Home Medications   Current Outpatient Rx  Name  Route  Sig  Dispense  Refill  . Eletriptan Hydrobromide (RELPAX PO)   Oral   Take 1 tablet by mouth as needed. For migraines         . imipramine (TOFRANIL) 50 MG tablet   Oral   Take 50 mg by mouth at bedtime.         Marland Kitchen EXPIRED: promethazine (PHENERGAN) 25 MG tablet   Oral   Take 1 tablet (25 mg total) by mouth every 6 (six) hours as needed for nausea.   12 tablet   0   . promethazine (PHENERGAN) 25 MG tablet   Oral   Take 1 tablet (25 mg total) by mouth every 6 (six) hours as  needed for nausea.   20 tablet   0   . propranolol (INDERAL) 20 MG tablet   Oral   Take 20 mg by mouth 2 (two) times daily.         . sertraline (ZOLOFT) 50 MG tablet   Oral   Take 50 mg by mouth 2 (two) times daily.           BP 136/97  Pulse 77  Temp(Src) 98.6 F (37 C) (Oral)  Resp 14  SpO2 97%  Physical Exam  Constitutional: He is oriented to person, place, and time. He appears well-developed and well-nourished.  HENT:  Head: Normocephalic and atraumatic.  Eyes: Conjunctivae are normal. Pupils are equal, round, and reactive to light.  Neck: Normal range of motion. Neck supple.  Cardiovascular: Normal rate, regular rhythm, normal heart sounds and intact distal pulses.   Pulmonary/Chest: Effort normal and breath sounds normal.  Abdominal: Soft. Bowel sounds are normal.  Neurological: He is alert and oriented to person, place, and time.  Skin: Skin is warm and dry.  Psychiatric: He has a normal mood and affect. His behavior is normal. Judgment and thought content normal.    ED Course  Procedures (including critical  care time)  Labs Reviewed  CBC WITH DIFFERENTIAL - Abnormal; Notable for the following:    MCHC 36.6 (*)    All other components within normal limits  COMPREHENSIVE METABOLIC PANEL  POCT I-STAT TROPONIN I   Dg Chest 2 View  04/05/2012  *RADIOLOGY REPORT*  Clinical Data: Chest pain.  Shortness of breath.  CHEST - 2 VIEW  Comparison: Two-view chest x-ray 02/09/2012.  One-view chest x-ray 08/05/2011 and portable chest x-ray 07/24/2008.  Findings: Cardiomediastinal silhouette unremarkable, unchanged. Lungs clear.  Bronchovascular markings normal.  Pulmonary vascularity normal.  No pneumothorax.  No pleural effusions. Visualized bony thorax intact.  No significant interval change.  IMPRESSION: No acute cardiopulmonary disease.  Stable examination.   Original Report Authenticated By: Hulan Saas, M.D.      No diagnosis found.   Date: 04/05/2012   Rate: 80  Rhythm: normal sinus rhythm  QRS Axis: normal  Intervals: normal  ST/T Wave abnormalities: normal  Conduction Disutrbances: none  Narrative Interpretation: unremarkable     MDM  + chronic ha x 5 mnths, worse tonight, also chest pain.  No neurology fu now.  Labs benign.  Will ct head,  Analgesia, reassess        Rosanne Ashing, MD 04/05/12 2318

## 2012-04-05 NOTE — ED Notes (Signed)
Pt c/o mid upper chest pain onset last pm then became more intense approx 2 hr ago.  Pt st's he also has had shortness of breath, nausea and vomiting.

## 2012-04-05 NOTE — ED Notes (Signed)
Pt stated that for the last several months he's had swollen lymphnodes in his neck.  BP is high. Pt states extreme pain in head and neck.

## 2012-04-06 ENCOUNTER — Emergency Department (HOSPITAL_COMMUNITY): Payer: 59

## 2012-04-06 MED ORDER — DEXAMETHASONE SODIUM PHOSPHATE 10 MG/ML IJ SOLN
10.0000 mg | Freq: Once | INTRAMUSCULAR | Status: AC
  Administered 2012-04-06: 10 mg via INTRAVENOUS
  Filled 2012-04-06: qty 1

## 2012-04-06 NOTE — ED Notes (Signed)
Discussed making a follow up appointment with neurologist in the AM.  Upon being discharged I asked patient if he would like a wheelchair he stated 'no, I'll just walk out".  His wife states "maybe while walking out he'll fall and then he'll have to be admitted."  Pt's wife really wanted pt to be admitted.

## 2012-04-09 ENCOUNTER — Other Ambulatory Visit: Payer: Self-pay | Admitting: Family Medicine

## 2012-04-09 DIAGNOSIS — M542 Cervicalgia: Secondary | ICD-10-CM

## 2012-04-10 ENCOUNTER — Ambulatory Visit
Admission: RE | Admit: 2012-04-10 | Discharge: 2012-04-10 | Disposition: A | Discharge status: Home / Self Care | Payer: 59 | Source: Ambulatory Visit | Attending: Family Medicine | Admitting: Family Medicine

## 2012-04-10 DIAGNOSIS — M542 Cervicalgia: Secondary | ICD-10-CM

## 2012-04-17 ENCOUNTER — Encounter: Payer: Self-pay | Admitting: Diagnostic Neuroimaging

## 2012-04-17 ENCOUNTER — Ambulatory Visit (INDEPENDENT_AMBULATORY_CARE_PROVIDER_SITE_OTHER): Payer: 59 | Admitting: Diagnostic Neuroimaging

## 2012-04-17 VITALS — BP 125/79 | HR 80 | Temp 98.1°F | Ht 70.5 in | Wt 209.0 lb

## 2012-04-17 DIAGNOSIS — M542 Cervicalgia: Secondary | ICD-10-CM | POA: Insufficient documentation

## 2012-04-17 DIAGNOSIS — G43009 Migraine without aura, not intractable, without status migrainosus: Secondary | ICD-10-CM

## 2012-04-17 DIAGNOSIS — G43701 Chronic migraine without aura, not intractable, with status migrainosus: Secondary | ICD-10-CM | POA: Insufficient documentation

## 2012-04-17 HISTORY — DX: Cervicalgia: M54.2

## 2012-04-17 NOTE — Progress Notes (Signed)
[facility]  PATIENT: [name] DOB: 03-20-1975  REFERRING CLINICIAN: Kevan Ny HISTORY FROM: patient REASON FOR VISIT: follow up   HISTORICAL  CHIEF COMPLAINT:  Chief Complaint  Patient presents with  . Pain    neck  . Nausea  . Emesis  . Dizziness  . Migraine    HISTORY OF PRESENT ILLNESS:   UPDATE 04/17/12: Since last visit, migraines under better control, now with 4 days migraine per month. Previously 10-12 days per month. When I last of the patient I had started propranolol 40 mg twice a day. At some point this was reduced to 20 mg twice a day, he thinks because of fatigue and energy problems. One to 2 weeks ago propranolol was increased again back to 40 mg twice a day.  Since October 2013, patient has had increasing neck pain. Patient also diagnosed with cervical lymphadenopathy. He has had followup CT soft tissue studies which have been stable. Patient cannot identify any other aggravate factors. Neck pain sometimes associated with headaches but not always. Patient is scheduled to see pain management clinic in the next 2 weeks. He has not had physical therapy yet.  PRIOR HPI (07/28/09:): 37 year old right-handed male with history of generalized anxiety disorder, chronic pain presenting for evaluation of intractable migraine headaches since 2002. He is accompanied by his father in law for this visit.  In 2002 patient developed severe headaches, usually behind one eye, posterior neck, associated with nausea, photophobia and phonophobia. He also developed sensitivity to certain smells and visual changes. He was diagnosed with migraine headaches and tried on a variety of medications including: zomig, sumatriptan, maxalt, relapax, Topamax, Neurontin, Reglan, zofran.  He initially had success with Relpax imipramine. He had to stop topamax because of kidney stones.  Over the past 9 years he is averaged 12 days of severe headache per month.  Over the past 3 months  his headaches have increased in severity, frequency and duration. He's had a severe bout of headaches for the past 3 weeks. He was tried on a prednisone Dosepak without success. He was restarted on imipramine one week ago.  He reports being taken off of Relpax because of suspected medication overuse headache.  Patient also reports significant stress related to work and family.  His father in law also endorses stress as a contributing factor.  REVIEW OF SYSTEMS: Full 14 system review of systems performed and notable only for fatigue, mild chest pain, spinning sensation, rash, itching, multiple blurred vision, loss of vision, eye pain, shortness of breath, snoring, urination problems, lymph nodes, allergies, confusion, headache, numbness, weakness, dizziness, tremor, snoring, restless legs, anxiety, decreased energy.  ALLERGIES: Allergies  Allergen Reactions  . Erythromycin Nausea And Vomiting    HOME MEDICATIONS: Outpatient Prescriptions Prior to Visit  Medication Sig Dispense Refill  . eletriptan (RELPAX) 40 MG tablet One tablet by mouth at onset of headache. May repeat in 2 hours if headache persists or recurs. may repeat in 2 hours if necessary as needed for migraines      . HYDROcodone-acetaminophen (NORCO/VICODIN) 5-325 MG per tablet Take 1-2 tablets by mouth every 6 (six) hours as needed for pain. For pain      . imipramine (TOFRANIL) 50 MG tablet Take 50 mg by mouth at bedtime.      . ondansetron (ZOFRAN) 4 MG tablet Take 4 mg by mouth every 8 (eight) hours as needed for nausea. For nausea      . propranolol (INDERAL) 20 MG tablet Take 20  mg by mouth. 2 x 20mg  tabs PO twice a day (total 40mg  BID)      . sertraline (ZOLOFT) 50 MG tablet Take 50 mg by mouth 2 (two) times daily.      Marland Kitchen tiZANidine (ZANAFLEX) 4 MG tablet Take 4 mg by mouth every 6 (six) hours as needed. For muscle spasms       No facility-administered medications prior to visit.    PAST MEDICAL HISTORY: Past Medical  History  Diagnosis Date  . DDD (degenerative disc disease)   . Shingles   . Renal calculi   . Migraines   . Hypertension   . IBS (irritable bowel syndrome)   . Anxiety   . Chronic pain     PAST SURGICAL HISTORY: Past Surgical History  Procedure Laterality Date  . Lithotripsy      FAMILY HISTORY: Family History  Problem Relation Age of Onset  . Headache Mother   . Prostate cancer Father     SOCIAL HISTORY:  History   Social History  . Marital Status: Married    Spouse Name: Victorino Dike    Number of Children: 3  . Years of Education: N/A   Occupational History  .  [employer]   Social History Main Topics  . Smoking status: Never Smoker   . Smokeless tobacco: Not on file  . Alcohol Use: No     Comment: rarely  . Drug Use: No  . Sexually Active: Not on file   Other Topics Concern  . Not on file   Social History Narrative   Caffeine-Pt drinks 2 cups of caffeine daily.     PHYSICAL EXAM  Filed Vitals:   04/17/12 0918  Temp: 98.1 F (36.7 C)  TempSrc: Oral  Height: 5' 10.5" (1.791 m)  Weight: 209 lb (94.802 kg)   Body mass index is 29.55 kg/(m^2).  GENERAL EXAM: Patient is in no distress; NECK IS SUPPLE. MILD ANTERIOR CERVICAL AND SUBMANDIBULAR LYMPHADENOPATHY.  CARDIOVASCULAR: Regular rate and rhythm, no murmurs, no carotid bruits  NEUROLOGIC: MENTAL STATUS: awake, alert, language fluent, comprehension intact, naming intact CRANIAL NERVE: no papilledema on fundoscopic exam, pupils equal and reactive to light, visual fields full to confrontation, extraocular muscles intact, no nystagmus, facial sensation and strength symmetric, uvula midline, shoulder shrug symmetric, tongue midline. MOTOR: normal bulk and tone, full strength in the BUE, BLE SENSORY: normal and symmetric to light touch, pinprick, temperature, vibration and proprioception COORDINATION: finger-nose-finger, fine finger movements normal REFLEXES: deep tendon reflexes present and  symmetric; BRISK AT KNEES AND ANKLES. GAIT/STATION: narrow based gait; able to walk on toes, heels and tandem; romberg is negative   DIAGNOSTIC DATA (LABS, IMAGING, TESTING) - I reviewed patient records, labs, notes, testing and imaging myself where available.  Lab Results  Component Value Date   WBC 8.4 04/05/2012   HGB 16.3 04/05/2012   HCT 44.5 04/05/2012   MCV 83.6 04/05/2012   PLT 328 04/05/2012      Component Value Date/Time   NA 139 04/05/2012 1911   K 3.9 04/05/2012 1911   CL 100 04/05/2012 1911   CO2 26 04/05/2012 1911   GLUCOSE 97 04/05/2012 1911   BUN 15 04/05/2012 1911   CREATININE 0.83 04/05/2012 1911   CALCIUM 10.0 04/05/2012 1911   PROT 8.3 04/05/2012 1911   ALBUMIN 4.3 04/05/2012 1911   AST 22 04/05/2012 1911   ALT 20 04/05/2012 1911   ALKPHOS 102 04/05/2012 1911   BILITOT 0.6 04/05/2012 1911   GFRNONAA >90 04/05/2012  1911   GFRAA >90 04/05/2012 1911   Lab Results  Component Value Date   CHOL  Value: 158        ATP III CLASSIFICATION:  <200     mg/dL   Desirable  952-841  mg/dL   Borderline High  >=324    mg/dL   High        4/0/1027   HDL 27* 07/24/2008   LDLCALC  Value: 70        Total Cholesterol/HDL:CHD Risk Coronary Heart Disease Risk Table                     Men   Women  1/2 Average Risk   3.4   3.3  Average Risk       5.0   4.4  2 X Average Risk   9.6   7.1  3 X Average Risk  23.4   11.0        Use the calculated Patient Ratio above and the CHD Risk Table to determine the patient's CHD Risk.        ATP III CLASSIFICATION (LDL):  <100     mg/dL   Optimal  253-664  mg/dL   Near or Above                    Optimal  130-159  mg/dL   Borderline  403-474  mg/dL   High  >259     mg/dL   Very High 05/22/3873   TRIG 306* 07/24/2008   CHOLHDL 5.9 07/24/2008   No results found for this basename: HGBA1C   No results found for this basename: VITAMINB12   No results found for this basename: TSH   08/05/09 MRI BRAIN (with and without) - normal   ASSESSMENT AND PLAN  37 y.o. year old  male  has a past medical history of DDD (degenerative disc disease); Shingles; Renal calculi; Migraines; Hypertension; IBS (irritable bowel syndrome); Anxiety; and Chronic pain. here with history of generalized anxiety disorder, chronic pain presenting for evaluation of intractable migraine headaches since 2002. Headaches are better than in 2011. Neck pain has worsened.  PLAN: 1. Continue propranolol 40mg  BID; can increase to 60mg  BID after 1-2 months; could also consider adding gabapentin, but patient already having polypharmacy side effects currently 2. Continue relpax 40mg  prn 3. Agree with pain mgmt clinic referral for neck pain 4. Cervical lymph node follow up per PCP   Suanne Marker, MD 04/17/2012, 9:55 AM Certified in Neurology, Neurophysiology and Neuroimaging  [facility] [address], [address] [place], [place] [address] [phone]

## 2012-04-17 NOTE — Patient Instructions (Signed)
Continue propranolol and relpax. Agree with pain mgmt referral for neck pain.

## 2012-04-28 ENCOUNTER — Encounter (HOSPITAL_COMMUNITY): Payer: Self-pay | Admitting: *Deleted

## 2012-04-28 ENCOUNTER — Ambulatory Visit (INDEPENDENT_AMBULATORY_CARE_PROVIDER_SITE_OTHER): Payer: 59 | Admitting: Family Medicine

## 2012-04-28 ENCOUNTER — Emergency Department (HOSPITAL_COMMUNITY): Payer: 59

## 2012-04-28 ENCOUNTER — Emergency Department (HOSPITAL_COMMUNITY)
Admission: EM | Admit: 2012-04-28 | Discharge: 2012-04-28 | Disposition: A | Discharge status: Home / Self Care | Payer: 59 | Attending: Emergency Medicine | Admitting: Emergency Medicine

## 2012-04-28 VITALS — BP 128/90 | HR 102 | Temp 98.2°F | Resp 16 | Ht 70.5 in | Wt 201.0 lb

## 2012-04-28 DIAGNOSIS — Z8619 Personal history of other infectious and parasitic diseases: Secondary | ICD-10-CM | POA: Insufficient documentation

## 2012-04-28 DIAGNOSIS — N50819 Testicular pain, unspecified: Secondary | ICD-10-CM

## 2012-04-28 DIAGNOSIS — N509 Disorder of male genital organs, unspecified: Secondary | ICD-10-CM

## 2012-04-28 DIAGNOSIS — N5089 Other specified disorders of the male genital organs: Secondary | ICD-10-CM

## 2012-04-28 DIAGNOSIS — G43909 Migraine, unspecified, not intractable, without status migrainosus: Secondary | ICD-10-CM | POA: Insufficient documentation

## 2012-04-28 DIAGNOSIS — Z8739 Personal history of other diseases of the musculoskeletal system and connective tissue: Secondary | ICD-10-CM | POA: Insufficient documentation

## 2012-04-28 DIAGNOSIS — R112 Nausea with vomiting, unspecified: Secondary | ICD-10-CM

## 2012-04-28 DIAGNOSIS — R1032 Left lower quadrant pain: Secondary | ICD-10-CM

## 2012-04-28 DIAGNOSIS — Z8719 Personal history of other diseases of the digestive system: Secondary | ICD-10-CM | POA: Insufficient documentation

## 2012-04-28 DIAGNOSIS — Z87442 Personal history of urinary calculi: Secondary | ICD-10-CM | POA: Insufficient documentation

## 2012-04-28 DIAGNOSIS — F411 Generalized anxiety disorder: Secondary | ICD-10-CM | POA: Insufficient documentation

## 2012-04-28 DIAGNOSIS — M542 Cervicalgia: Secondary | ICD-10-CM

## 2012-04-28 DIAGNOSIS — R21 Rash and other nonspecific skin eruption: Secondary | ICD-10-CM | POA: Insufficient documentation

## 2012-04-28 DIAGNOSIS — G8929 Other chronic pain: Secondary | ICD-10-CM | POA: Insufficient documentation

## 2012-04-28 DIAGNOSIS — I10 Essential (primary) hypertension: Secondary | ICD-10-CM | POA: Insufficient documentation

## 2012-04-28 DIAGNOSIS — I861 Scrotal varices: Secondary | ICD-10-CM | POA: Insufficient documentation

## 2012-04-28 DIAGNOSIS — Z79899 Other long term (current) drug therapy: Secondary | ICD-10-CM | POA: Insufficient documentation

## 2012-04-28 LAB — URINALYSIS, ROUTINE W REFLEX MICROSCOPIC
Bilirubin Urine: NEGATIVE
Glucose, UA: NEGATIVE mg/dL
Hgb urine dipstick: NEGATIVE
Ketones, ur: NEGATIVE mg/dL
Leukocytes, UA: NEGATIVE
Nitrite: NEGATIVE
Protein, ur: NEGATIVE mg/dL
Specific Gravity, Urine: 1.031 — ABNORMAL HIGH (ref 1.005–1.030)
Urobilinogen, UA: 1 mg/dL (ref 0.0–1.0)
pH: 6 (ref 5.0–8.0)

## 2012-04-28 LAB — BASIC METABOLIC PANEL
BUN: 15 mg/dL (ref 6–23)
CO2: 26 mEq/L (ref 19–32)
Calcium: 9.6 mg/dL (ref 8.4–10.5)
Chloride: 99 mEq/L (ref 96–112)
Creatinine, Ser: 0.86 mg/dL (ref 0.50–1.35)
GFR calc Af Amer: >90 mL/min (ref >90)
GFR calc non Af Amer: >90 mL/min (ref >90)
Glucose, Bld: 97 mg/dL (ref 70–99)
Potassium: 4.7 mEq/L (ref 3.5–5.1)
Sodium: 138 mEq/L (ref 135–145)

## 2012-04-28 LAB — CBC
HCT: 43.3 % (ref 39.0–52.0)
Hemoglobin: 15.9 g/dL (ref 13.0–17.0)
MCH: 30.6 pg (ref 26.0–34.0)
MCHC: 36.7 g/dL — ABNORMAL HIGH (ref 30.0–36.0)
MCV: 83.4 fL (ref 78.0–100.0)
Platelets: 320 10*3/uL (ref 150–400)
RBC: 5.19 MIL/uL (ref 4.22–5.81)
RDW: 12.8 % (ref 11.5–15.5)
WBC: 8.2 10*3/uL (ref 4.0–10.5)

## 2012-04-28 MED ORDER — PROMETHAZINE HCL 25 MG/ML IJ SOLN
12.5000 mg | Freq: Once | INTRAMUSCULAR | Status: AC
  Administered 2012-04-28: 12.5 mg via INTRAVENOUS
  Filled 2012-04-28: qty 1

## 2012-04-28 MED ORDER — ONDANSETRON HCL 4 MG/2ML IJ SOLN
4.0000 mg | Freq: Once | INTRAMUSCULAR | Status: AC
  Administered 2012-04-28: 4 mg via INTRAVENOUS
  Filled 2012-04-28: qty 2

## 2012-04-28 MED ORDER — OXYCODONE-ACETAMINOPHEN 5-325 MG PO TABS
1.0000 | ORAL_TABLET | Freq: Once | ORAL | Status: AC
  Administered 2012-04-28: 1 via ORAL
  Filled 2012-04-28: qty 1

## 2012-04-28 MED ORDER — SODIUM CHLORIDE 0.9 % IV BOLUS (SEPSIS)
1000.0000 mL | Freq: Once | INTRAVENOUS | Status: AC
  Administered 2012-04-28: 1000 mL via INTRAVENOUS

## 2012-04-28 MED ORDER — MORPHINE SULFATE 4 MG/ML IJ SOLN
4.0000 mg | Freq: Once | INTRAMUSCULAR | Status: AC
  Administered 2012-04-28: 4 mg via INTRAVENOUS
  Filled 2012-04-28: qty 1

## 2012-04-28 MED ORDER — KETOROLAC TROMETHAMINE 60 MG/2ML IM SOLN
60.0000 mg | Freq: Once | INTRAMUSCULAR | Status: AC
  Administered 2012-04-28: 60 mg via INTRAMUSCULAR

## 2012-04-28 MED ORDER — IBUPROFEN 800 MG PO TABS: 800.0000 mg | ORAL_TABLET | Freq: Three times a day (TID) | ORAL | Status: DC

## 2012-04-28 MED ORDER — MUPIROCIN CALCIUM 2 % EX CREA: TOPICAL_CREAM | Freq: Three times a day (TID) | CUTANEOUS | Status: DC

## 2012-04-28 MED ORDER — OXYCODONE-ACETAMINOPHEN 5-325 MG PO TABS: 2.0000 | ORAL_TABLET | ORAL | Status: DC | PRN

## 2012-04-28 MED ORDER — HYDROMORPHONE HCL PF 1 MG/ML IJ SOLN
1.0000 mg | Freq: Once | INTRAMUSCULAR | Status: AC
  Administered 2012-04-28: 1 mg via INTRAVENOUS
  Filled 2012-04-28: qty 1

## 2012-04-28 MED ORDER — ONDANSETRON 4 MG PO TBDP
4.0000 mg | ORAL_TABLET | Freq: Once | ORAL | Status: AC
  Administered 2012-04-28: 4 mg via ORAL

## 2012-04-28 NOTE — ED Notes (Signed)
Pt presents to ed with c/o left testicular pain that started 2 days ago and got much worse today. Pt also reports swelling in his left testicle.

## 2012-04-28 NOTE — ED Provider Notes (Signed)
Medical screening examination/treatment/procedure(s) were conducted as a shared visit with non-physician practitioner(s) and myself.  I personally evaluated the patient during the encounter  Pt seen and examined. ttp over upper left testicle/scrotal region.  U/s shows varicocele.  Pt referred to urology.  Pt and family with many questions, I have answered all questions to the best of my ability  Ethelda Chick, MD 04/28/12 2056

## 2012-04-28 NOTE — Patient Instructions (Signed)
Go to [facility] ER for evaluation of the left testicle pain and swelling immediately after leaving our office. I have advised the charge nurse you are on the way. You can discuss the rash and plan for your neck and back pain there, but the testicle pain and swelling is the primary reason of your eval.  Keep follow up as planned by your primary provider. Return to the clinic or go to the nearest emergency room if any of your symptoms worsen or new symptoms occur.

## 2012-04-28 NOTE — Progress Notes (Signed)
Subjective:    Patient ID: [name], male    DOB: December 25, 1975, 37 y.o.   MRN: [id number]  HPI [name] is a 37 y.o. male  Here for multiple concerns today.  Carilyn Goodpasture at [employer]   Per 04/17/12 neuro eval -has a past medical history of DDD (degenerative disc disease); Shingles; Renal calculi; Migraines; Hypertension; IBS (irritable bowel syndrome),  and Chronic neck pain,  generalized anxiety disorder, chronic pain with  intractable migraine headaches since 2002. Continued propranolol 40mg  BID, continued relpax 40mg  prn, and referral to pain mgmt clinic referral for neck pain   L sided Back pain, Groin pain - past 2 days - squeezing feeling in testicle.  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  mostly in testicle on L side.  Wife though was swollen last night and this morning. Pain worsening.  No difficulty with urinating, darker urine, but no blood noticed.  Last kidney stone few years ago. sharp pain - nausea - vomited once this morning, but feels like this is due to the pain.  Tried zofran at home - helped some.  Noticed rash in this groin area past 1 month, but rash not painful.  ? Shingles in left leg about 5 years ago.  Chills with pain, no fever.  Prior urology at [facility] urology - s/p lithotripsy x 2.   Chronic neck pain - noticed some more soreness with groin pain. Hydrocodone - 2 per day past few days, usually 1 as needed.  None taken today. 4 left.  Last rx - Carilyn Goodpasture about 2 weeks ago. Denies addiction to these meds. Consult with pain management in 5 days - Dr. Eduard Clos.   Hx of cervical  lymphadenopathy - evaluated by gen surgery  in February after referral from Carilyn Goodpasture LAD noted on CT scans, but referred to ENT for eval.  Has seen ENT and followed by Carilyn Goodpasture.    Review of Systems     Objective:   Physical Exam  Constitutional: He is oriented to person, place, and time. He appears well-developed and  well-nourished.  HENT:  Head: Normocephalic and atraumatic.  Cardiovascular: Normal heart sounds and intact distal pulses.   No extrasystoles are present. Tachycardia present.   Pulmonary/Chest: Effort normal and breath sounds normal.  Abdominal: Soft. Bowel sounds are normal. There is no tenderness. Hernia confirmed negative in the right inguinal area and confirmed negative in the left inguinal area.  Genitourinary: Penis normal.    Left testis shows swelling and tenderness.  Musculoskeletal:       Cervical back: He exhibits decreased range of motion, tenderness and spasm.       Back:  Neurological: He is alert and oriented to person, place, and time.  Skin: Skin is warm. Rash noted. He is diaphoretic (appears uncomfortable on exam, slight diaphoresis. ).  Psychiatric: He has a normal mood and affect. His behavior is normal.       Assessment & Plan:  KRAVEN CALK is a 37 y.o. male Nausea with vomiting - Plan: ondansetron (ZOFRAN-ODT) disintegrating tablet 4 mg  Left groin pain - Plan: ketorolac (TORADOL) injection 60 mg  Testicle pain  Swelling of left testicle  Primary concern today of L testicle  pain and swelling - initially noticed 2 nights ago with progression of pain and swelling since then.  ddx orchitis vs torsion. Hx of nephrolithiasis, but different sx's currently.   Discussed with charge nurse in [place].  Zofran  4mg  ODT and toradol 60mg  IM given, and snet to ER by private vehicle - wife driving.  To discuss his neck pain, lbp and cervical lymphadenopathy with PCP, but plan for pain mgt in 5 days.   L groin rash - possible tinea - can be discussed at ER.  Patient Instructions  Go to [facility] ER for evaluation of the left testicle pain and swelling immediately after leaving our office. I have advised the charge nurse you are on the way. You can discuss the rash and plan for your neck and back pain there, but the testicle pain and swelling is the primary reason of your  eval.  Keep follow up as planned by your primary provider. Return to the clinic or go to the nearest emergency room if any of your symptoms worsen or new symptoms occur.

## 2012-04-28 NOTE — ED Notes (Signed)
Pt is aware that a urine sample is needed.  

## 2012-04-28 NOTE — Discharge Instructions (Signed)
Varicocele A varicocele is a swelling of veins in the scrotum (the bag of skin that contains the testicles). It is most common in young men. It occurs most often on the left side. Small or painless varicoceles do not need treatment. Most often, this is not a serious problem, but further tests may be needed to confirm the diagnosis. Surgery may be needed if complications of varicoceles arise. Rarely, varicoceles can reoccur after surgery. CAUSES  The swelling is due to blood backing up in the vein that leads from the testicle back to the body. Blood backs up because the valves inside the vein are not working properly. Veins normally return blood to the heart. Valves in veins are supposed to be one-way valves. They should not allow blood to flow backwards. If the valves do not work well, blood can pool in a vein and make it swell. The same thing happens with varicose veins in the leg. SYMPTOMS  A varicocele most often causes no symptoms. When they occur, symptoms include:   Swelling on one side of the scrotum.  Swelling that is more obvious when standing up.  A lumpy feeling in the scrotum.  Heaviness on one side of the scrotum.  Dull ache in the scrotum, especially after exercise or prolonged standing or sitting.  Slower growth or reduced size of the testicle on the side of the varicocele (in young males).  Problems with fertility can arise if the testicle does not grow normally. DIAGNOSIS  Varicocele is usually diagnosed by a physical exam. Sometimes ultrasonography is done. TREATMENT  Usually, varicoceles need no treatment. They are often routinely monitored on exam by your caregiver to ensure they do not slow the growth of the testicle on that side. Treatment may be needed if:  The varicocele is large.  There is a lot of pain.  The varicocele causes a decrease in the size of the testicle in a growing adolescent.  The other testicle is absent or not normal.  Varicoceles are found on  both sides of the scrotum.  There is pain when exercising.  There are fertility problems. There are two types of treatment:  Surgery. The surgeon ties off the swollen veins. Surgery may be done with an incision in the skin or through a laparoscope. The surgery is usually done in an outpatient setting. Outpatient means there is no overnight stay in a hospital.  Embolization. A small tube is placed in a vein and guided into the swollen veins. X-rays are used to guide the small tube. Tiny metal coils or other blocking items are put through the tube. This blocks swollen veins and the flow of blood. This is usually done in an outpatient setting without the use of general anesthesia. HOME CARE INSTRUCTIONS  To decrease discomfort:  Wear supportive underwear.  Use an athletic supporter for sports.  Only take over-the-counter or prescription medicines for pain or discomfort as directed by your caregiver. SEEK MEDICAL CARE IF:   Pain is increasing.  Swelling does not decrease when lying down.  Testicle is smaller.  The testicle becomes enlarged, swollen, red, or painful. Document Released: 04/11/2000 Document Revised: 03/28/2011 Document Reviewed: 04/15/2009 [facility] Patient Information 2013 [place], [place].  Fungus Infection of the Skin An infection of your skin caused by a fungus is a very common problem. Treatment depends on which part of the body is affected. Types of fungal skin infection include:  Athlete's Foot(Tinea pedis). This infection starts between the toes and may involve the entire sole  and sides of foot. It is the most common fungal disease. It is made worse by heat, moisture, and friction. To treat, wash your feet 2 to 3 times daily. Dry thoroughly between the toes. Use medicated foot powder or cream as directed on the package. Plain talc, cornstarch, or rice powder may be dusted into socks and shoes to keep the feet dry. Wearing footwear that allows ventilation is also  helpful.  Ringworm (Tinea corporis and tinea capitis). This infection causes scaly red rings to form on the skin or scalp. For skin sores, apply medicated lotion or cream as directed on the package. For the scalp, medicated shampoo may be used with with other therapies. Ringworm of the scalp or fingernails usually requires using oral medicine for 2 to 4 months.  Tinea versicolor. This infection appears as painless, scaly, patchy areas of discolored skin (whitish to light brown). It is more common in the summer and favors oily areas of the skin such as those found at the chest, abdomen, back, pubis, neck, and body folds. It can be treated with medicated shampoo or with medicated topical cream. Oral antifungals may be needed for more active infections. The light and/or dark spots may take time to get better and is not a sign of treatment failure. Fungal infections may need to be treated for several weeks to be cured. It is important not to treat fungal infections with steroids or combination medicine that contains an antifungal and steroid as these will make the fungal infection worse. SEEK MEDICAL CARE IF:   You have persistent itching or rawness.  You have an oral temperature above 102 F (38.9 C). Document Released: 02/11/2004 Document Revised: 03/28/2011 Document Reviewed: 04/28/2009 [facility] Patient Information 2013 [place], [place].

## 2012-04-28 NOTE — ED Notes (Signed)
Pt reports frustration with seeing an array of MDs and not getting a clear answer as to what is going on w/ pt. Pt requesting to see MD. NP and MD made aware.

## 2012-04-28 NOTE — ED Provider Notes (Signed)
History     CSN: [id number]  Arrival date & time 04/28/12  1738   First MD Initiated Contact with Patient 04/28/12 1801      Chief Complaint  Patient presents with  . Testicle Pain    (Consider location/radiation/quality/duration/timing/severity/associated sxs/prior treatment) HPI Comments: Pt states that he developed a rash in his groin 3days ago and then he developed pain in his left testicle 3 days ago:pt denies dysuria or abdominal pain:pt denies fever,n/v/d:pt states that he has history of kidney stone but this is not similar:pt states that he was seen at urgent care and sent over here for ultrasound  The history is provided by the patient. No language interpreter was used.    Past Medical History  Diagnosis Date  . DDD (degenerative disc disease)   . Shingles   . Renal calculi   . Migraines   . Hypertension   . IBS (irritable bowel syndrome)   . Anxiety   . Chronic pain   . Allergy     Past Surgical History  Procedure Laterality Date  . Lithotripsy      Family History  Problem Relation Age of Onset  . Headache Mother   . Prostate cancer Father     History  Substance Use Topics  . Smoking status: Never Smoker   . Smokeless tobacco: Not on file  . Alcohol Use: No     Comment: rarely      Review of Systems  Constitutional: Negative.   Respiratory: Negative.   Cardiovascular: Negative.     Allergies  Erythromycin  Home Medications   Current Outpatient Rx  Name  Route  Sig  Dispense  Refill  . eletriptan (RELPAX) 40 MG tablet   Oral   One tablet by mouth at onset of headache. May repeat in 2 hours if headache persists or recurs. may repeat in 2 hours if necessary as needed for migraines         . HYDROcodone-acetaminophen (NORCO/VICODIN) 5-325 MG per tablet   Oral   Take 1-2 tablets by mouth every 6 (six) hours as needed for pain. For pain         . imipramine (TOFRANIL) 50 MG tablet   Oral   Take 50 mg by mouth at bedtime.          . ondansetron (ZOFRAN) 4 MG tablet   Oral   Take 4 mg by mouth every 8 (eight) hours as needed for nausea. For nausea         . propranolol (INDERAL) 20 MG tablet   Oral   Take 40 mg by mouth 2 (two) times daily. 2 x 20mg  tabs PO twice a day (total 40mg  BID)         . sertraline (ZOLOFT) 50 MG tablet   Oral   Take 50 mg by mouth 2 (two) times daily.         Marland Kitchen tiZANidine (ZANAFLEX) 4 MG tablet   Oral   Take 4 mg by mouth every 6 (six) hours as needed. For muscle spasms           BP 150/93  Temp(Src) 98.7 F (37.1 C) (Oral)  SpO2 97%  Physical Exam  Nursing note and vitals reviewed. Constitutional: He is oriented to person, place, and time. He appears well-developed and well-nourished.  HENT:  Head: Atraumatic.  Eyes: Conjunctivae and EOM are normal.  Cardiovascular: Normal rate and regular rhythm.   Pulmonary/Chest: Effort normal and breath sounds normal.  Abdominal: Soft.  Bowel sounds are normal. There is no tenderness. Hernia confirmed negative in the right inguinal area and confirmed negative in the left inguinal area.  Genitourinary: Left testis shows swelling and tenderness. Circumcised. No penile tenderness.  Musculoskeletal: Normal range of motion.  Neurological: He is alert and oriented to person, place, and time.  Skin:  Pt has red raised are to the left groin and testicle  Psychiatric: He has a normal mood and affect.    ED Course  Procedures (including critical care time)  Labs Reviewed  CBC - Abnormal; Notable for the following:    MCHC 36.7 (*)    All other components within normal limits  BASIC METABOLIC PANEL  URINALYSIS, ROUTINE W REFLEX MICROSCOPIC   US Scrotum  04/28/2012  *RADIOLOGY REPORT*  Clinical Data:  37 year old male with pain and swelling.  History of lithotripsy.  SCROTAL ULTRASOUND DOPPLER ULTRASOUND OF THE TESTICLES  Technique: Complete ultrasound examination of the testicles, epididymis, and other scrotal structures was  performed.  Color and spectral Doppler ultrasound were also utilized to evaluate blood flow to the testicles.  Comparison:  Pelvis CT 04/04/2011.  Findings:  Right testis:  Normal echotexture.  4.6 x 2.0 x 2.9 cm.  Focal scrotal pearl with shadowing (image 8). Underlying mild microlithiasis.  Left testis:  4.7 x 2.2 x 3.0 cm.  Microlithiasis.  Otherwise echotexture within normal limits.  Right epididymis:  Normal in size and appearance.  Left epididymis:  Normal in size and appearance.  Hydrocele:  None.  Varicocele:  Positive on the left.  Pulsed Doppler interrogation of both testes demonstrates low resistance flow bilaterally. Symmetric appearing vascularity of the testes.  No hypervascularity identified.  IMPRESSION: 1.  No evidence of testicular torsion.  No asymmetric hypervascularity to suggest acute scrotal inflammation. 2.   Left side varicocele. 3.  Left greater than right testicular microlithiasis. Current literature suggests that testicular microlithiasis is not a significant independent risk factor for development of testicular carcinoma, and that followup imaging is not warranted in the absence of other risk factors.  Monthly testicular self-examination and annual physical exams are considered appropriate surveillance. If patient has other risk factors for testicular carcinoma, then referral to Urology should be considered.  (Reference:  DeCastro, et al.:  A 5-Year Followup Study of Asymptomatic Men with Testicular Microlithiasis.  J Urol 2008; 179:1420-1423.)   Original Report Authenticated By: Erskine Speed, M.D.      1. Varicocele       MDM  Pt is okay to follow up with urology:will treat topically for infection        Teressa Lower, NP 04/28/12 2053

## 2012-04-28 NOTE — ED Notes (Signed)
Off to ultrasound

## 2012-04-28 NOTE — ED Notes (Signed)
PT still at US

## 2012-04-28 NOTE — ED Notes (Signed)
Pt aware of need of urine sample.  Pt still wants to attempt to give urine sample on his own before an in-and-out cath.

## 2012-04-28 NOTE — ED Notes (Signed)
Pt bladder scanned: 51ml in bladder.

## 2012-05-17 ENCOUNTER — Other Ambulatory Visit: Payer: Self-pay | Admitting: Family Medicine

## 2012-05-17 DIAGNOSIS — M549 Dorsalgia, unspecified: Secondary | ICD-10-CM

## 2012-05-19 ENCOUNTER — Ambulatory Visit
Admission: RE | Admit: 2012-05-19 | Discharge: 2012-05-19 | Disposition: A | Discharge status: Home / Self Care | Payer: 59 | Source: Ambulatory Visit | Attending: Family Medicine | Admitting: Family Medicine

## 2012-05-19 DIAGNOSIS — M549 Dorsalgia, unspecified: Secondary | ICD-10-CM

## 2012-05-21 ENCOUNTER — Other Ambulatory Visit: Payer: 59

## 2012-05-22 ENCOUNTER — Encounter (INDEPENDENT_AMBULATORY_CARE_PROVIDER_SITE_OTHER): Payer: Self-pay

## 2012-05-24 ENCOUNTER — Other Ambulatory Visit: Payer: 59

## 2012-05-29 ENCOUNTER — Ambulatory Visit: Payer: Self-pay | Admitting: Diagnostic Neuroimaging

## 2012-11-06 ENCOUNTER — Ambulatory Visit: Payer: 59 | Admitting: Diagnostic Neuroimaging

## 2012-11-13 ENCOUNTER — Ambulatory Visit: Payer: 59 | Admitting: Diagnostic Neuroimaging

## 2012-11-22 ENCOUNTER — Other Ambulatory Visit: Payer: Self-pay

## 2012-12-16 ENCOUNTER — Other Ambulatory Visit: Payer: Self-pay

## 2012-12-16 ENCOUNTER — Emergency Department (HOSPITAL_COMMUNITY)
Admission: EM | Admit: 2012-12-16 | Discharge: 2012-12-16 | Disposition: A | Discharge status: Home / Self Care | Payer: 59 | Attending: Emergency Medicine | Admitting: Emergency Medicine

## 2012-12-16 ENCOUNTER — Emergency Department (HOSPITAL_COMMUNITY): Payer: 59

## 2012-12-16 ENCOUNTER — Encounter (HOSPITAL_COMMUNITY): Payer: Self-pay | Admitting: Emergency Medicine

## 2012-12-16 DIAGNOSIS — G43909 Migraine, unspecified, not intractable, without status migrainosus: Secondary | ICD-10-CM | POA: Insufficient documentation

## 2012-12-16 DIAGNOSIS — I10 Essential (primary) hypertension: Secondary | ICD-10-CM | POA: Insufficient documentation

## 2012-12-16 DIAGNOSIS — Z87442 Personal history of urinary calculi: Secondary | ICD-10-CM | POA: Diagnosis not present

## 2012-12-16 DIAGNOSIS — R079 Chest pain, unspecified: Secondary | ICD-10-CM | POA: Diagnosis present

## 2012-12-16 DIAGNOSIS — Z8619 Personal history of other infectious and parasitic diseases: Secondary | ICD-10-CM | POA: Insufficient documentation

## 2012-12-16 DIAGNOSIS — Z791 Long term (current) use of non-steroidal anti-inflammatories (NSAID): Secondary | ICD-10-CM | POA: Insufficient documentation

## 2012-12-16 DIAGNOSIS — Z8719 Personal history of other diseases of the digestive system: Secondary | ICD-10-CM | POA: Diagnosis not present

## 2012-12-16 DIAGNOSIS — F411 Generalized anxiety disorder: Secondary | ICD-10-CM | POA: Diagnosis not present

## 2012-12-16 DIAGNOSIS — R110 Nausea: Secondary | ICD-10-CM | POA: Insufficient documentation

## 2012-12-16 DIAGNOSIS — G8929 Other chronic pain: Secondary | ICD-10-CM | POA: Insufficient documentation

## 2012-12-16 DIAGNOSIS — IMO0002 Reserved for concepts with insufficient information to code with codable children: Secondary | ICD-10-CM | POA: Diagnosis not present

## 2012-12-16 DIAGNOSIS — Z79899 Other long term (current) drug therapy: Secondary | ICD-10-CM | POA: Diagnosis not present

## 2012-12-16 LAB — BASIC METABOLIC PANEL
BUN: 11 mg/dL (ref 6–23)
CO2: 26 mEq/L (ref 19–32)
Calcium: 10.3 mg/dL (ref 8.4–10.5)
Chloride: 97 mEq/L (ref 96–112)
Creatinine, Ser: 0.88 mg/dL (ref 0.50–1.35)
GFR calc Af Amer: >90 mL/min (ref >90)
GFR calc non Af Amer: >90 mL/min (ref >90)
Glucose, Bld: 95 mg/dL (ref 70–99)
Potassium: 4 mEq/L (ref 3.5–5.1)
Sodium: 135 mEq/L (ref 135–145)

## 2012-12-16 LAB — CBC
HCT: 43.7 % (ref 39.0–52.0)
Hemoglobin: 15.4 g/dL (ref 13.0–17.0)
MCH: 30.6 pg (ref 26.0–34.0)
MCHC: 35.2 g/dL (ref 30.0–36.0)
MCV: 86.7 fL (ref 78.0–100.0)
Platelets: 304 10*3/uL (ref 150–400)
RBC: 5.04 MIL/uL (ref 4.22–5.81)
RDW: 13 % (ref 11.5–15.5)
WBC: 7.9 10*3/uL (ref 4.0–10.5)

## 2012-12-16 LAB — TROPONIN I
Troponin I: <0.3 ng/mL (ref <0.30)
Troponin I: <0.3 ng/mL (ref <0.30)

## 2012-12-16 LAB — D-DIMER, QUANTITATIVE: D-Dimer, Quant: <0.27 ug/mL-FEU (ref 0.00–0.48)

## 2012-12-16 MED ORDER — ONDANSETRON HCL 4 MG/2ML IJ SOLN
4.0000 mg | Freq: Once | INTRAMUSCULAR | Status: AC
  Administered 2012-12-16: 4 mg via INTRAVENOUS
  Filled 2012-12-16: qty 2

## 2012-12-16 MED ORDER — ONDANSETRON 8 MG PO TBDP: 8.0000 mg | ORAL_TABLET | Freq: Three times a day (TID) | ORAL | Status: DC | PRN

## 2012-12-16 MED ORDER — GI COCKTAIL ~~LOC~~
30.0000 mL | Freq: Once | ORAL | Status: AC
  Administered 2012-12-16: 30 mL via ORAL
  Filled 2012-12-16: qty 30

## 2012-12-16 MED ORDER — LORAZEPAM 2 MG/ML IJ SOLN
1.0000 mg | Freq: Once | INTRAMUSCULAR | Status: AC
  Administered 2012-12-16: 1 mg via INTRAVENOUS
  Filled 2012-12-16: qty 1

## 2012-12-16 MED ORDER — ALBUTEROL SULFATE HFA 108 (90 BASE) MCG/ACT IN AERS
2.0000 | INHALATION_SPRAY | Freq: Once | RESPIRATORY_TRACT | Status: AC
  Administered 2012-12-16: 2 via RESPIRATORY_TRACT
  Filled 2012-12-16: qty 6.7

## 2012-12-16 MED ORDER — OXYCODONE-ACETAMINOPHEN 5-325 MG PO TABS
1.0000 | ORAL_TABLET | Freq: Once | ORAL | Status: AC
  Administered 2012-12-16: 1 via ORAL
  Filled 2012-12-16: qty 1

## 2012-12-16 MED ORDER — HYDROCODONE-ACETAMINOPHEN 5-325 MG PO TABS: 1.0000 | ORAL_TABLET | ORAL | Status: DC | PRN

## 2012-12-16 MED ORDER — LORAZEPAM 1 MG PO TABS
1.0000 mg | ORAL_TABLET | Freq: Once | ORAL | Status: AC
  Administered 2012-12-16: 1 mg via ORAL
  Filled 2012-12-16: qty 1

## 2012-12-16 MED ORDER — MORPHINE SULFATE 4 MG/ML IJ SOLN
6.0000 mg | Freq: Once | INTRAMUSCULAR | Status: AC
  Administered 2012-12-16: 6 mg via INTRAVENOUS
  Filled 2012-12-16: qty 2

## 2012-12-16 MED ORDER — KETOROLAC TROMETHAMINE 30 MG/ML IJ SOLN
30.0000 mg | Freq: Once | INTRAMUSCULAR | Status: AC
  Administered 2012-12-16: 30 mg via INTRAVENOUS
  Filled 2012-12-16: qty 1

## 2012-12-16 NOTE — ED Provider Notes (Signed)
CSN: [id number]     Arrival date & time 12/16/12  0013 History   First MD Initiated Contact with Patient 12/16/12 0055     Chief Complaint  Patient presents with  . Chest Pain  . Nausea  . Anxiety   HPI Patient presents emergency apartment with 4-5 hours of left-sided chest discomfort worse with deep breathing.  He states it feels like a pressure in the left side of his chest.  There is mild radiation up to the inferior portion of his left lateral neck.  No radiation to her shoulders.  No radiation through to his back.  Nausea.  No significant shortness of breath.  No vomiting.  No diarrhea.  No fevers or chills.  No recent cough or congestion.  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  His pain has been constant since then.  His pain is moderate in severity.  He does report some crampy pain in his bilateral lower extremities.  His no pressure DVT or pulmonary embolism.  He has a history of hypertension.  He has no history of hyperlipidemia, diabetes.  No family history of early heart disease.  Both his parents are still alive.  He has siblings that are older with no known coronary disease.   Past Medical History  Diagnosis Date  . DDD (degenerative disc disease)   . Shingles   . Renal calculi   . Migraines   . Hypertension   . IBS (irritable bowel syndrome)   . Anxiety   . Chronic pain   . Allergy    Past Surgical History  Procedure Laterality Date  . Lithotripsy     Family History  Problem Relation Age of Onset  . Headache Mother   . Prostate cancer Father    History  Substance Use Topics  . Smoking status: Never Smoker   . Smokeless tobacco: Not on file  . Alcohol Use: No     Comment: rarely    Review of Systems  All other systems reviewed and are negative.    Allergies   Erythromycin  Home Medications   Current Outpatient Rx  Name  Route  Sig  Dispense  Refill  . eletriptan (RELPAX) 40 MG tablet   Oral   One tablet by mouth at onset of headache. May repeat in 2 hours if headache persists or recurs. may repeat in 2 hours if necessary as needed for migraines         . imipramine (TOFRANIL) 50 MG tablet   Oral   Take 50 mg by mouth at bedtime.         . meloxicam (MOBIC) 7.5 MG tablet   Oral   Take 7.5 mg by mouth daily.         . ondansetron (ZOFRAN) 4 MG tablet   Oral   Take 4 mg by mouth every 8 (eight) hours as needed for nausea. For nausea         . propranolol (INDERAL) 20 MG tablet   Oral   Take 40 mg by mouth 2 (two) times daily. 2 x 20mg  tabs PO twice a day (total 40mg  BID)         . HYDROcodone-acetaminophen (NORCO/VICODIN) 5-325 MG per tablet   Oral   Take 1-2 tablets by mouth every 6 (six) hours as  needed for pain. For pain         . HYDROcodone-acetaminophen (NORCO/VICODIN) 5-325 MG per tablet   Oral   Take 1 tablet by mouth every 4 (four) hours as needed for moderate pain.   15 tablet   0   . ibuprofen (ADVIL,MOTRIN) 800 MG tablet   Oral   Take 1 tablet (800 mg total) by mouth 3 (three) times daily.   21 tablet   0   . mupirocin cream (BACTROBAN) 2 %   Topical   Apply topically 3 (three) times daily.   15 g   0   . ondansetron (ZOFRAN ODT) 8 MG disintegrating tablet   Oral   Take 1 tablet (8 mg total) by mouth every 8 (eight) hours as needed for nausea or vomiting.   10 tablet   0   . oxyCODONE-acetaminophen (PERCOCET/ROXICET) 5-325 MG per tablet   Oral   Take 2 tablets by mouth every 4 (four) hours as needed for pain.   10 tablet   0   . sertraline (ZOLOFT) 50 MG tablet   Oral   Take 50 mg by mouth 2 (two) times daily.         Marland Kitchen tiZANidine (ZANAFLEX) 4 MG tablet   Oral   Take 4 mg by mouth every 6 (six) hours as needed. For muscle spasms          BP 119/89  Pulse 99  Temp(Src) 98.3  F (36.8 C) (Oral)  Resp 19  Ht 5' 10.5" (1.791 m)  Wt 208 lb (94.348 kg)  BMI 29.41 kg/m2  SpO2 92% Physical Exam  Nursing note and vitals reviewed. Constitutional: He is oriented to person, place, and time. He appears well-developed and well-nourished.  HENT:  Head: Normocephalic and atraumatic.  Eyes: EOM are normal.  Neck: Normal range of motion.  Cardiovascular: Normal rate, regular rhythm, normal heart sounds and intact distal pulses.   Pulmonary/Chest: Effort normal and breath sounds normal. No respiratory distress.  Abdominal: Soft. He exhibits no distension. There is no tenderness.  Musculoskeletal: Normal range of motion.  Neurological: He is alert and oriented to person, place, and time.  Skin: Skin is warm and dry.  Psychiatric: He has a normal mood and affect. Judgment normal.  Slightly anxious appearing    ED Course  Procedures (including critical care time)  ECG interpretation  Date: 12/16/2012  Rate: 99  Rhythm: normal sinus rhythm  QRS Axis: normal  Intervals: normal  ST/T Wave abnormalities: normal  Conduction Disutrbances: none  Narrative Interpretation:   Old EKG Reviewed: No significant changes noted   ECG interpretation [id number]  Date: 12/16/2012  Rate: 97  Rhythm: normal sinus rhythm  QRS Axis: normal  Intervals: normal  ST/T Wave abnormalities: normal  Conduction Disutrbances: none  Narrative Interpretation:   Old EKG Reviewed: No significant changes noted      Labs Review Labs Reviewed  CBC  BASIC METABOLIC PANEL  TROPONIN I  D-DIMER, QUANTITATIVE  TROPONIN I   Imaging Review Dg Chest 2 View  12/16/2012   CLINICAL DATA:  Chest pain.  Nausea.  Anxiety.  EXAM: CHEST  2 VIEW  COMPARISON:  04/05/2012  FINDINGS: Normal heart size and pulmonary vascularity. Suggestion of developing infiltration or atelectasis in the left lung base since the previous study. No pneumothorax. No blunting of costophrenic angles.  IMPRESSION: Suggestion of  mild atelectasis or infiltration focally in the left lung base.   Electronically Signed   By: Burman Nieves  M.D.   On: 12/16/2012 02:48  I personally reviewed the imaging tests through PACS system I reviewed available ER/hospitalization records through the EMR     MDM   1. Chest pain   2. Nausea    Patient was monitored in the emergency department for multiple hours.  D-dimer is normal.  EKG x2 is normal.  Troponin x2 is normal.  My suspicion for ACS and pulmonary embolism is very low.  Repeat abdominal exam is benign.  Doubt cholelithiasis or biliary colic.  Doubt dissection.  Patient is feeling better at time of discharge.  DC home in good condition.  PCP followup.  He understands to return to the ER for new or worsening symptoms    Lyanne Co, MD 12/16/12 [phone]

## 2012-12-16 NOTE — ED Notes (Signed)
Patient complaining of chest pain, nausea, and pain in the left side of his neck. Started yesterday while working per pt. May just be anxiety per pt.

## 2013-01-30 ENCOUNTER — Ambulatory Visit: Payer: 59 | Attending: Family Medicine | Admitting: Physical Therapy

## 2013-01-30 DIAGNOSIS — R5381 Other malaise: Secondary | ICD-10-CM | POA: Insufficient documentation

## 2013-01-30 DIAGNOSIS — M542 Cervicalgia: Secondary | ICD-10-CM | POA: Insufficient documentation

## 2013-01-30 DIAGNOSIS — IMO0001 Reserved for inherently not codable concepts without codable children: Secondary | ICD-10-CM | POA: Insufficient documentation

## 2013-01-30 DIAGNOSIS — G43909 Migraine, unspecified, not intractable, without status migrainosus: Secondary | ICD-10-CM | POA: Insufficient documentation

## 2013-02-02 ENCOUNTER — Ambulatory Visit (INDEPENDENT_AMBULATORY_CARE_PROVIDER_SITE_OTHER): Payer: 59 | Admitting: Emergency Medicine

## 2013-02-02 VITALS — BP 112/72 | HR 114 | Temp 98.1°F | Resp 16 | Ht 70.25 in | Wt 214.0 lb

## 2013-02-02 DIAGNOSIS — M542 Cervicalgia: Secondary | ICD-10-CM

## 2013-02-02 MED ORDER — NAPROXEN SODIUM 550 MG PO TABS: 550.0000 mg | ORAL_TABLET | Freq: Two times a day (BID) | ORAL | Status: DC

## 2013-02-02 MED ORDER — ONDANSETRON 8 MG PO TBDP: 8.0000 mg | ORAL_TABLET | Freq: Three times a day (TID) | ORAL | Status: DC | PRN

## 2013-02-02 NOTE — Progress Notes (Signed)
[facility] [address], [place] [address] [phone]  Date:  02/02/2013   Name:  [name]   DOB:  06/19/1975   MRN:  [id number]  PCP:  Wynelle Fanny    Chief Complaint: Neck Pain   History of Present Illness:  [name] is a 38 y.o. very pleasant male patient who presents with the following:  Has chronic neck pain in area below jaw line.   Found to have enlarged lymph nodes.  Tried on a number of different muscle relaxants with little improvement.  Has numerous imaging tests including CT, MR and contrast MR which are significant for DJD and mild impingement.  Has seen various consultants including FMD, ENT, neurology and pain management.  Really little improvement with PT.  Has been on a number of muscle relaxants with no improvement.  Now taking motrin and vicodin. Not taking vicodin.  No history of injury or overuse.  No improvement with over the counter medications or other home remedies. Denies other complaint or health concern today.   Patient Active Problem List   Diagnosis Date Noted  . Migraine without aura 04/17/2012  . Cervicalgia 04/17/2012    Past Medical History  Diagnosis Date  . DDD (degenerative disc disease)   . Shingles   . Renal calculi   . Migraines   . Hypertension   . IBS (irritable bowel syndrome)   . Anxiety   . Chronic pain   . Allergy     Past Surgical History  Procedure Laterality Date  . Lithotripsy      History  Substance Use Topics  . Smoking status: Never Smoker   . Smokeless tobacco: Not on file  . Alcohol Use: No     Comment: rarely    Family History  Problem Relation Age of Onset  . Headache Mother   . Prostate cancer Father     Allergies  Allergen Reactions  . Erythromycin Nausea And Vomiting    Medication list has been reviewed and updated.  Current Outpatient Prescriptions on File Prior to Visit  Medication Sig Dispense Refill  . HYDROcodone-acetaminophen  (NORCO/VICODIN) 5-325 MG per tablet Take 1 tablet by mouth every 4 (four) hours as needed for moderate pain.  15 tablet  0  . ibuprofen (ADVIL,MOTRIN) 800 MG tablet Take 1 tablet (800 mg total) by mouth 3 (three) times daily.  21 tablet  0  . eletriptan (RELPAX) 40 MG tablet One tablet by mouth at onset of headache. May repeat in 2 hours if headache persists or recurs. may repeat in 2 hours if necessary as needed for migraines      . HYDROcodone-acetaminophen (NORCO/VICODIN) 5-325 MG per tablet Take 1-2 tablets by mouth every 6 (six) hours as needed for pain. For pain      . imipramine (TOFRANIL) 50 MG tablet Take 50 mg by mouth at bedtime.      . meloxicam (MOBIC) 7.5 MG tablet Take 7.5 mg by mouth daily.      . mupirocin cream (BACTROBAN) 2 % Apply topically 3 (three) times daily.  15 g  0  . ondansetron (ZOFRAN ODT) 8 MG disintegrating tablet Take 1 tablet (8 mg total) by mouth every 8 (eight) hours as needed for nausea or vomiting.  10 tablet  0  . ondansetron (ZOFRAN) 4 MG tablet Take 4 mg by mouth every 8 (eight) hours as needed for nausea. For nausea      . oxyCODONE-acetaminophen (PERCOCET/ROXICET) 5-325  MG per tablet Take 2 tablets by mouth every 4 (four) hours as needed for pain.  10 tablet  0  . propranolol (INDERAL) 20 MG tablet Take 40 mg by mouth 2 (two) times daily. 2 x 20mg  tabs PO twice a day (total 40mg  BID)      . sertraline (ZOLOFT) 50 MG tablet Take 50 mg by mouth 2 (two) times daily.      Marland Kitchen tiZANidine (ZANAFLEX) 4 MG tablet Take 4 mg by mouth every 6 (six) hours as needed. For muscle spasms       No current facility-administered medications on file prior to visit.    Review of Systems:  As per HPI, otherwise negative.    Physical Examination: Filed Vitals:   02/02/13 1532  BP: 112/72  Pulse: 114  Temp: 98.1 F (36.7 C)  Resp: 16   Filed Vitals:   02/02/13 1532  Height: 5' 10.25" (1.784 m)  Weight: 214 lb (97.07 kg)   Body mass index is 30.5 kg/(m^2). Ideal  Body Weight: Weight in (lb) to have BMI = 25: 175.1   GEN: WDWN, NAD, Non-toxic, Alert & Oriented x 3 HEENT: Atraumatic, Normocephalic.  NO LAD Ears and Nose: No external deformity. EXTR: No clubbing/cyanosis/edema NEURO: Normal gait.  PSYCH: Normally interactive. Conversant. Not depressed or anxious appearing.  Calm demeanor.  NECK:  Full ROM  Assessment and Plan: Neck pain Anaprox ds Stop motrin  Signed,  Ellison Carwin, MD

## 2013-02-05 ENCOUNTER — Ambulatory Visit: Payer: 59 | Admitting: Physical Therapy

## 2013-02-06 ENCOUNTER — Telehealth: Payer: Self-pay | Admitting: *Deleted

## 2013-02-06 NOTE — Telephone Encounter (Signed)
[facility] Mychart After Visit Questionnaire    Question 02/05/2013 8:35 PM   How are you feeling after your recent visit? good   Does the recommended course of treatment seem to be helping your symptoms? a little   Are you experiencing any side effects from your recommended treatment? no   is there anything else you would like to ask your physician? is there anything else that can be done to alleviate pain      Can answer via mychart.

## 2013-02-12 ENCOUNTER — Ambulatory Visit: Payer: 59 | Admitting: Physical Therapy

## 2013-02-19 ENCOUNTER — Ambulatory Visit: Payer: 59 | Attending: Family Medicine

## 2013-02-19 DIAGNOSIS — G43909 Migraine, unspecified, not intractable, without status migrainosus: Secondary | ICD-10-CM | POA: Insufficient documentation

## 2013-02-19 DIAGNOSIS — IMO0001 Reserved for inherently not codable concepts without codable children: Secondary | ICD-10-CM | POA: Insufficient documentation

## 2013-02-19 DIAGNOSIS — R5381 Other malaise: Secondary | ICD-10-CM | POA: Insufficient documentation

## 2013-02-19 DIAGNOSIS — M542 Cervicalgia: Secondary | ICD-10-CM | POA: Insufficient documentation

## 2013-02-26 ENCOUNTER — Ambulatory Visit: Payer: 59 | Admitting: Physical Therapy

## 2013-04-20 ENCOUNTER — Ambulatory Visit (INDEPENDENT_AMBULATORY_CARE_PROVIDER_SITE_OTHER): Payer: 59 | Admitting: Internal Medicine

## 2013-04-20 VITALS — BP 108/70 | HR 85 | Temp 98.1°F | Resp 18 | Ht 71.0 in | Wt 205.2 lb

## 2013-04-20 DIAGNOSIS — R319 Hematuria, unspecified: Secondary | ICD-10-CM

## 2013-04-21 ENCOUNTER — Ambulatory Visit: Payer: 59

## 2013-04-21 ENCOUNTER — Ambulatory Visit (INDEPENDENT_AMBULATORY_CARE_PROVIDER_SITE_OTHER): Payer: 59 | Admitting: Internal Medicine

## 2013-04-21 VITALS — BP 120/84 | HR 89 | Temp 98.1°F | Resp 16 | Ht 70.0 in | Wt 204.0 lb

## 2013-04-21 DIAGNOSIS — R112 Nausea with vomiting, unspecified: Secondary | ICD-10-CM

## 2013-04-21 DIAGNOSIS — M549 Dorsalgia, unspecified: Secondary | ICD-10-CM

## 2013-04-21 DIAGNOSIS — R319 Hematuria, unspecified: Secondary | ICD-10-CM

## 2013-04-21 LAB — POCT URINALYSIS DIPSTICK
Bilirubin, UA: NEGATIVE
Blood, UA: NEGATIVE
Glucose, UA: NEGATIVE
Ketones, UA: NEGATIVE
Leukocytes, UA: NEGATIVE
Nitrite, UA: NEGATIVE
Protein, UA: NEGATIVE
Spec Grav, UA: >=1.03
Urobilinogen, UA: 0.2
pH, UA: 5.5

## 2013-04-21 LAB — POCT UA - MICROSCOPIC ONLY
Bacteria, U Microscopic: NEGATIVE
Casts, Ur, LPF, POC: NEGATIVE
Crystals, Ur, HPF, POC: NEGATIVE
Epithelial cells, urine per micros: NEGATIVE
Mucus, UA: NEGATIVE
Yeast, UA: NEGATIVE

## 2013-04-21 LAB — COMPREHENSIVE METABOLIC PANEL
ALT: 22 U/L (ref 0–53)
AST: 21 U/L (ref 0–37)
Albumin: 4.6 g/dL (ref 3.5–5.2)
Alkaline Phosphatase: 89 U/L (ref 39–117)
BUN: 15 mg/dL (ref 6–23)
CO2: 23 mEq/L (ref 19–32)
Calcium: 9.7 mg/dL (ref 8.4–10.5)
Chloride: 100 mEq/L (ref 96–112)
Creat: 0.84 mg/dL (ref 0.50–1.35)
Glucose, Bld: 85 mg/dL (ref 70–99)
Potassium: 4.5 mEq/L (ref 3.5–5.3)
Sodium: 134 mEq/L — ABNORMAL LOW (ref 135–145)
Total Bilirubin: 0.6 mg/dL (ref 0.2–1.2)
Total Protein: 8.2 g/dL (ref 6.0–8.3)

## 2013-04-21 LAB — POCT CBC
Granulocyte percent: 68.1 %G (ref 37–80)
HCT, POC: 46.7 % (ref 43.5–53.7)
Hemoglobin: 15.9 g/dL (ref 14.1–18.1)
Lymph, poc: 1.9 (ref 0.6–3.4)
MCH, POC: 30.8 pg (ref 27–31.2)
MCHC: 34 g/dL (ref 31.8–35.4)
MCV: 90.4 fL (ref 80–97)
MID (cbc): 0.4 (ref 0–0.9)
MPV: 9.4 fL (ref 0–99.8)
POC Granulocyte: 4.8 (ref 2–6.9)
POC LYMPH PERCENT: 26.6 %L (ref 10–50)
POC MID %: 5.3 %M (ref 0–12)
Platelet Count, POC: 360 10*3/uL (ref 142–424)
RBC: 5.17 M/uL (ref 4.69–6.13)
RDW, POC: 13.7 %
WBC: 7 10*3/uL (ref 4.6–10.2)

## 2013-04-21 LAB — POCT SEDIMENTATION RATE: POCT SED RATE: 31 mm/hr — AB (ref 0–22)

## 2013-04-21 MED ORDER — KETOROLAC TROMETHAMINE 60 MG/2ML IM SOLN
60.0000 mg | Freq: Once | INTRAMUSCULAR | Status: AC
  Administered 2013-04-21: 60 mg via INTRAMUSCULAR

## 2013-04-21 MED ORDER — ONDANSETRON HCL 4 MG PO TABS: 4.0000 mg | ORAL_TABLET | Freq: Three times a day (TID) | ORAL | Status: DC | PRN

## 2013-04-21 MED ORDER — ONDANSETRON 4 MG PO TBDP
8.0000 mg | ORAL_TABLET | Freq: Once | ORAL | Status: AC
  Administered 2013-04-21: 8 mg via ORAL

## 2013-04-21 NOTE — Progress Notes (Addendum)
Subjective:    Patient ID: SENDER STARR, male    DOB: 30-Aug-1975, 38 y.o.   MRN: [id number]  Back Pain Associated symptoms include abdominal pain. Pertinent negatives include no chest pain, dysuria, fever or headaches.  Hematuria Irritative symptoms do not include frequency or urgency. Associated symptoms include abdominal pain, chills, nausea and vomiting. Pertinent negatives include no dysuria or fever.  Constipation Associated symptoms include abdominal pain, back pain, nausea and vomiting. Pertinent negatives include no fever.  Emesis  Associated symptoms include abdominal pain and chills. Pertinent negatives include no chest pain, fever or headaches.   This chart was scribed for Ellamae Sia, MD by Andrew Au, ED Scribe. This patient was seen in room 11 and the patient's care was started at 1:10 PM.  HPI Comments: [name] is a 38 y.o. male who [facility] the [facility] complaining of worsening lower left back pain. Pt reports that he feels miserable. Pt reports that he received a steroid injection from Dr. Alvester Morin 7 days ago without relief to pain. He reports that he has felt worse after injection. Pt reports associated nausea, decreased appetite change, sweats, chills, emesis, and abdominal pain. Pt reports back pain is worse. Pt reports hematuria and blood in stool 2 days ago at one episode, but reports that the has not seen blood in urine or in stool since then. Pt denies relief after emesis episodes. Pt reports constipation and that his last BM was 2 days ago. Pt denies urge for BM. Pt reports that he may have h/o hemorrhoids.  The back problem is chronic. Pt reports that he has to take muscle relaxer to be able to sleep. He reports trouble falling asleep. Pt reports tingling in toes.    Pt reports exposure to bronchitis and stomach virus 2 weeks ago. Pt reports history of kidney stones. Pt denies fever. Pt reports he had an MRI 1 year ago on back.     Past Medical History  Diagnosis Date  . DDD (degenerative disc disease)   . Shingles   . Renal calculi   . Migraines   . Hypertension   . IBS (irritable bowel syndrome)   . Anxiety   . Chronic pain   . Allergy    Allergies  Allergen Reactions  . Erythromycin Nausea And Vomiting   Prior to Admission medications   Medication Sig Start Date End Date Taking? Authorizing Provider  eletriptan (RELPAX) 40 MG tablet One tablet by mouth at onset of headache. May repeat in 2 hours if headache persists or recurs. may repeat in 2 hours if necessary as needed for migraines   Yes Historical Provider, MD  ibuprofen (ADVIL,MOTRIN) 800 MG tablet Take 1 tablet (800 mg total) by mouth 3 (three) times daily. 04/28/12  Yes Teressa Lower, NP  imipramine (TOFRANIL) 50 MG tablet Take 50 mg by mouth at bedtime.   Yes Historical Provider, MD  lisinopril (PRINIVIL,ZESTRIL) 20 MG tablet Take 20 mg by mouth.   Yes Historical Provider, MD  meloxicam (MOBIC) 7.5 MG tablet Take 7.5 mg by mouth daily.   Yes Historical Provider, MD  ondansetron (ZOFRAN ODT) 8 MG disintegrating tablet Take 1 tablet (8 mg total) by mouth every 8 (eight) hours as needed for nausea or vomiting. 12/16/12  Yes Lyanne Co, MD  propranolol (INDERAL) 20 MG tablet Take 40 mg by mouth 2 (two) times daily. 2 x 20mg  tabs PO twice a day (total 40mg  BID)   Yes Historical  Provider, MD  tiZANidine (ZANAFLEX) 4 MG tablet Take 4 mg by mouth every 6 (six) hours as needed. For muscle spasms   Yes Historical Provider, MD   Review of Systems  Constitutional: Positive for chills, diaphoresis and appetite change ( decreased). Negative for fever and unexpected weight change.  HENT: Negative for hearing loss, postnasal drip, sinus pressure, sore throat and voice change.   Eyes: Negative for visual disturbance.  Respiratory: Negative for choking, shortness of breath and wheezing.   Cardiovascular: Negative for chest pain, palpitations and leg  swelling.  Gastrointestinal: Positive for nausea, vomiting, abdominal pain, constipation and blood in stool.  Genitourinary: Positive for hematuria. Negative for dysuria, urgency and frequency.  Musculoskeletal: Positive for back pain. Negative for joint swelling and neck pain.  Skin: Negative for rash.  Neurological: Negative for headaches.  Psychiatric/Behavioral: Positive for sleep disturbance and dysphoric mood. Negative for behavioral problems and self-injury.      Objective:   Physical Exam  Nursing note and vitals reviewed. Constitutional: He is oriented to person, place, and time. He appears well-developed and well-nourished. No distress.  Mild to moderate pain  HENT:  Head: Normocephalic and atraumatic.  Eyes: EOM are normal. Pupils are equal, round, and reactive to light.  Neck: Normal range of motion. Neck supple. No thyromegaly present.  Cardiovascular: Normal rate, regular rhythm and normal heart sounds.   No murmur heard. Pulmonary/Chest: Effort normal and breath sounds normal. No respiratory distress. He has no wheezes. He exhibits no tenderness.  Abdominal: Soft. Bowel sounds are normal. He exhibits no distension and no mass. There is tenderness ( mild tenderness in Left middle quadrant). There is no rebound and no guarding.  Tender LMQ w/out guard or rebou  Musculoskeletal: Normal range of motion. He exhibits no edema.  No CVA tenderness to percussion straight leg raise produces pain on lumbar area at 80 degrees. DTR normal, no motor sensory lost  Lymphadenopathy:    He has no cervical adenopathy.  Neurological: He is alert and oriented to person, place, and time. He has normal reflexes. No cranial nerve deficit.  Skin: Skin is warm and dry. No rash noted.  Psychiatric: He has a normal mood and affect. His behavior is normal.   Filed Vitals:   04/21/13 1221  BP: 120/84  Pulse: 89  Temp: 98.1 F (36.7 C)  Resp: 16   Results for orders placed in visit on 04/21/13   POCT CBC      Result Value Ref Range   WBC 7.0  4.6 - 10.2 K/uL   Lymph, poc 1.9  0.6 - 3.4   POC LYMPH PERCENT 26.6  10 - 50 %L   MID (cbc) 0.4  0 - 0.9   POC MID % 5.3  0 - 12 %M   POC Granulocyte 4.8  2 - 6.9   Granulocyte percent 68.1  37 - 80 %G   RBC 5.17  4.69 - 6.13 M/uL   Hemoglobin 15.9  14.1 - 18.1 g/dL   HCT, POC 40.9  81.1 - 53.7 %   MCV 90.4  80 - 97 fL   MCH, POC 30.8  27 - 31.2 pg   MCHC 34.0  31.8 - 35.4 g/dL   RDW, POC 91.4     Platelet Count, POC 360  142 - 424 K/uL   MPV 9.4  0 - 99.8 fL  POCT URINALYSIS DIPSTICK      Result Value Ref Range   Color, UA yellow  Clarity, UA clear     Glucose, UA neg     Bilirubin, UA neg     Ketones, UA neg     Spec Grav, UA >=1.030     Blood, UA neg     pH, UA 5.5     Protein, UA neg     Urobilinogen, UA 0.2     Nitrite, UA neg     Leukocytes, UA Negative    POCT UA - MICROSCOPIC ONLY      Result Value Ref Range   WBC, Ur, HPF, POC 0-4     RBC, urine, microscopic 0-4     Bacteria, U Microscopic neg     Mucus, UA neg     Epithelial cells, urine per micros neg     Crystals, Ur, HPF, POC neg     Casts, Ur, LPF, POC neg     Yeast, UA neg    [facility] reading (PRIMARY) by  DrDoolittle=Lots of stool--no stone seen      Assessment & Plan:   I have completed the patient encounter in its entirety as documented by the scribe, with editing by me where necessary. Cathlyn Tersigni P. Merla Riches, M.D.   1. Back pain -degen dz  2. Hematuria - w/ hx stones--[facility]  3.  Pain w/ nausea--constipation ? cause Meds ordered this encounter  Medications  . ketorolac (TORADOL) injection 60 mg    Sig:   . ondansetron (ZOFRAN-ODT) disintegrating tablet 8 mg    Sig:    Will have f/u at urol this week to repeat scan re poss stones Ok for zofr and melox til then [facility] labs=cmet///may need Ct abd/pel

## 2013-05-23 IMAGING — CR DG ABDOMEN 1V
2 series · 2 of 2 positions shown · non-contrast
Comparison: CT abdomen pelvis of 07/18/2010

CLINICAL DATA: Left flank and low back pain.  History of kidney
stones

ABDOMEN - 1 VIEW

[view not recorded (1 of 2)]
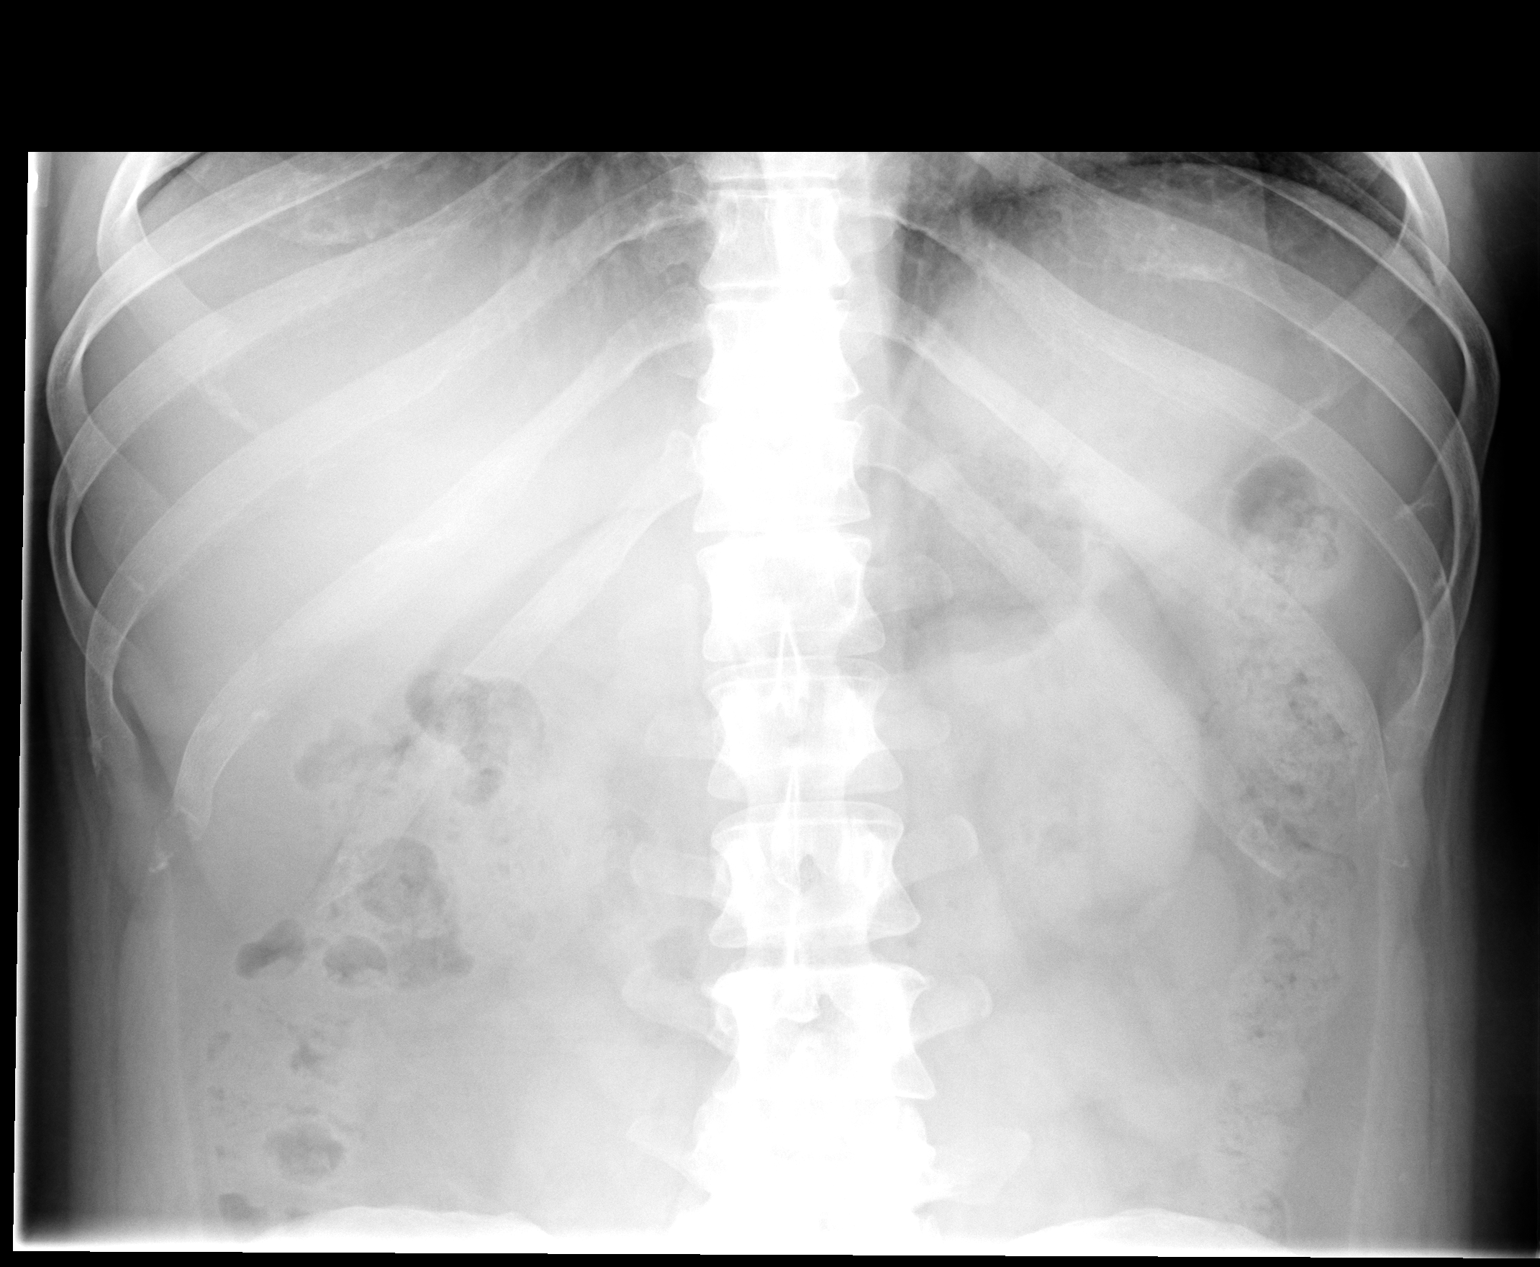

[view not recorded (2 of 2)]
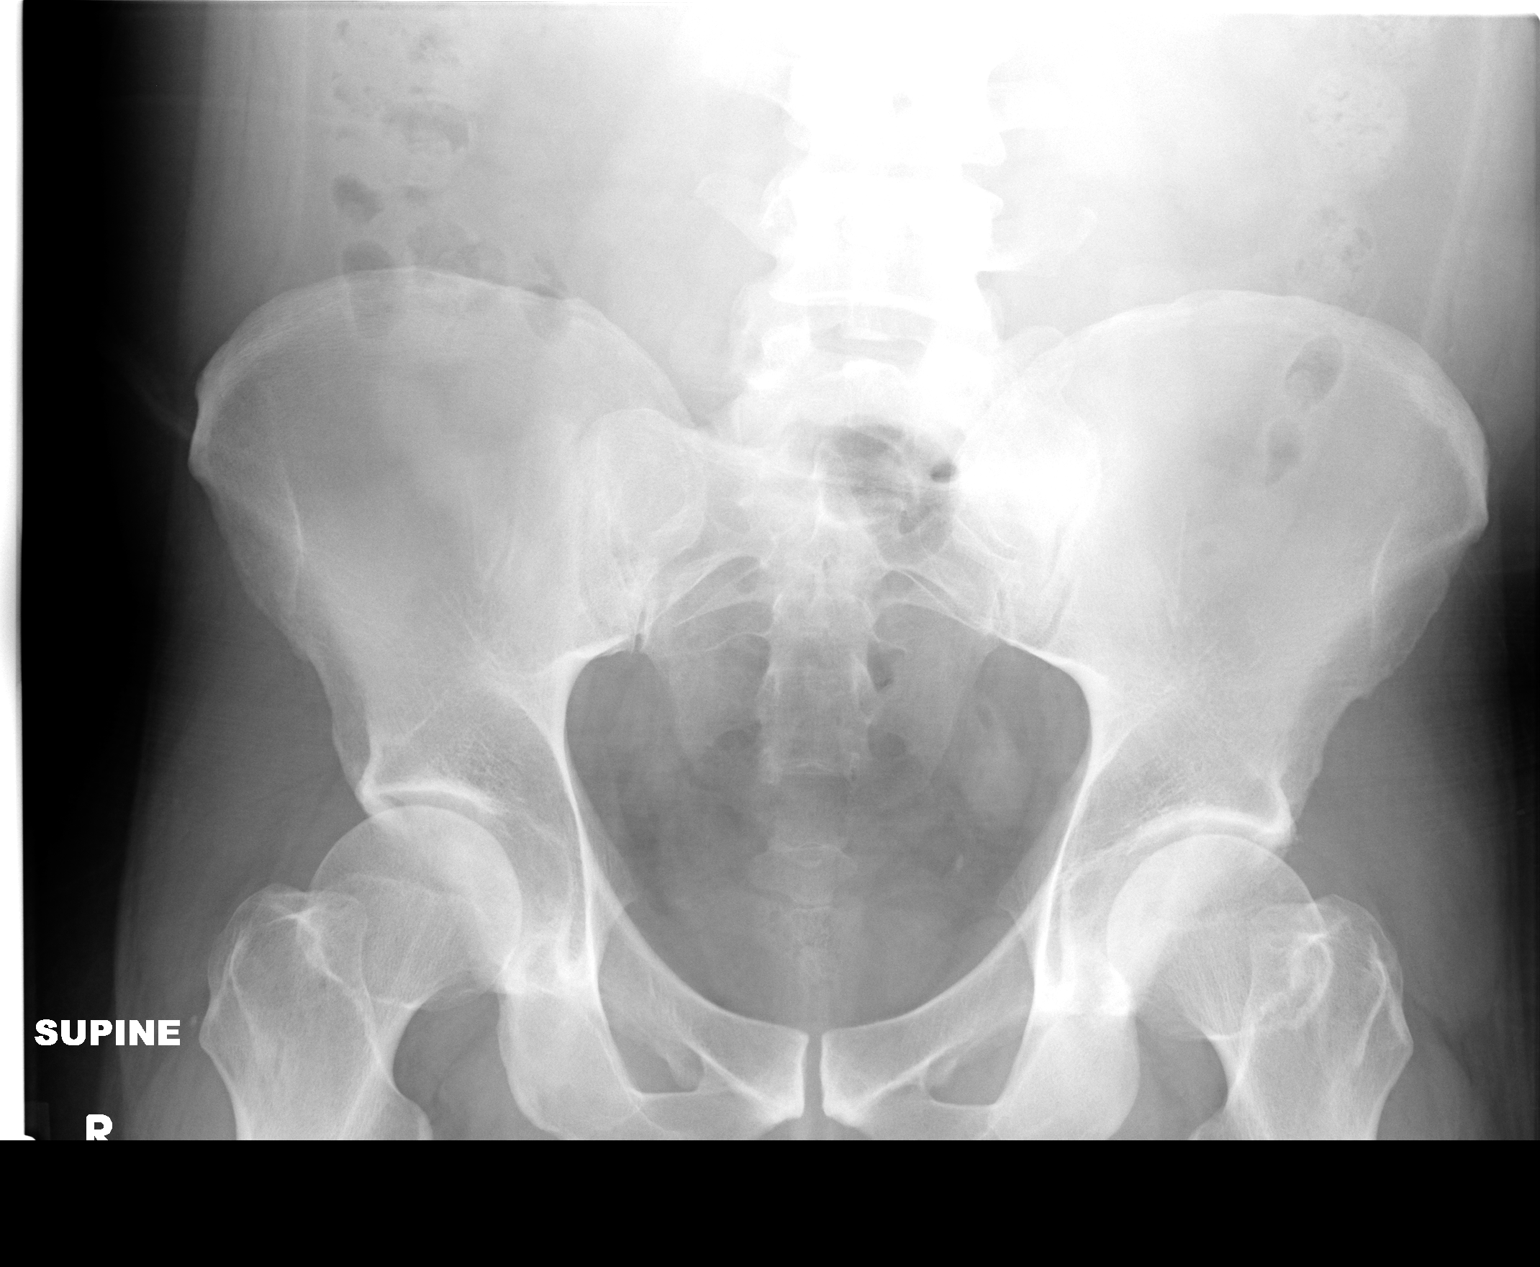

[2 of 2 positions shown; findings below may reference images not displayed]

FINDINGS: No opaque renal calculi are seen.  No calcifications are
noted along the expected courses of the ureters.  There is a
calcification in the left bony pelvis, but compared to the prior
CT, this is consistent with a phlebolith.  The bowel gas pattern is
nonspecific.  No bony abnormality is seen.
IMPRESSION: No opaque calculi.  Nonspecific bowel gas pattern.

## 2013-07-17 ENCOUNTER — Emergency Department (HOSPITAL_COMMUNITY)
Admission: EM | Admit: 2013-07-17 | Discharge: 2013-07-17 | Disposition: A | Discharge status: Home / Self Care | Payer: 59 | Attending: Emergency Medicine | Admitting: Emergency Medicine

## 2013-07-17 ENCOUNTER — Encounter (HOSPITAL_COMMUNITY): Payer: Self-pay | Admitting: Emergency Medicine

## 2013-07-17 DIAGNOSIS — M542 Cervicalgia: Secondary | ICD-10-CM | POA: Insufficient documentation

## 2013-07-17 DIAGNOSIS — IMO0002 Reserved for concepts with insufficient information to code with codable children: Secondary | ICD-10-CM | POA: Insufficient documentation

## 2013-07-17 DIAGNOSIS — I10 Essential (primary) hypertension: Secondary | ICD-10-CM | POA: Insufficient documentation

## 2013-07-17 DIAGNOSIS — G8929 Other chronic pain: Secondary | ICD-10-CM | POA: Insufficient documentation

## 2013-07-17 DIAGNOSIS — G43809 Other migraine, not intractable, without status migrainosus: Secondary | ICD-10-CM

## 2013-07-17 DIAGNOSIS — G43909 Migraine, unspecified, not intractable, without status migrainosus: Secondary | ICD-10-CM | POA: Insufficient documentation

## 2013-07-17 DIAGNOSIS — K589 Irritable bowel syndrome without diarrhea: Secondary | ICD-10-CM | POA: Insufficient documentation

## 2013-07-17 DIAGNOSIS — Z8619 Personal history of other infectious and parasitic diseases: Secondary | ICD-10-CM | POA: Insufficient documentation

## 2013-07-17 DIAGNOSIS — F411 Generalized anxiety disorder: Secondary | ICD-10-CM | POA: Insufficient documentation

## 2013-07-17 HISTORY — DX: Cervicalgia: M54.2

## 2013-07-17 HISTORY — DX: Other chronic pain: G89.29

## 2013-07-17 HISTORY — DX: Other cervical disc displacement, unspecified cervical region: M50.20

## 2013-07-17 MED ORDER — PROMETHAZINE HCL 25 MG/ML IJ SOLN
25.0000 mg | Freq: Once | INTRAMUSCULAR | Status: AC
  Administered 2013-07-17: 25 mg via INTRAMUSCULAR
  Filled 2013-07-17: qty 1

## 2013-07-17 MED ORDER — OXYCODONE-ACETAMINOPHEN 5-325 MG PO TABS
2.0000 | ORAL_TABLET | Freq: Once | ORAL | Status: AC
  Administered 2013-07-17: 2 via ORAL
  Filled 2013-07-17: qty 2

## 2013-07-17 MED ORDER — HYDROCODONE-ACETAMINOPHEN 5-325 MG PO TABS: ORAL_TABLET | ORAL | Status: DC

## 2013-07-17 MED ORDER — DIPHENHYDRAMINE HCL 50 MG/ML IJ SOLN
50.0000 mg | Freq: Once | INTRAMUSCULAR | Status: AC
  Administered 2013-07-17: 50 mg via INTRAMUSCULAR
  Filled 2013-07-17: qty 1

## 2013-07-17 MED ORDER — KETOROLAC TROMETHAMINE 60 MG/2ML IM SOLN
60.0000 mg | Freq: Once | INTRAMUSCULAR | Status: AC
  Administered 2013-07-17: 60 mg via INTRAMUSCULAR
  Filled 2013-07-17: qty 2

## 2013-07-17 MED ORDER — PROMETHAZINE HCL 25 MG PO TABS: 25.0000 mg | ORAL_TABLET | Freq: Four times a day (QID) | ORAL | Status: DC | PRN

## 2013-07-17 NOTE — ED Notes (Signed)
Notified dr Thurnell Garbe of patient having pain in his jaws

## 2013-07-17 NOTE — Discharge Instructions (Signed)
°Emergency Department Resource Guide °1) Find a Doctor and Pay Out of Pocket °Although you won't have to find out who is covered by your insurance plan, it is a good idea to ask around and get recommendations. You will then need to call the office and see if the doctor you have chosen will accept you as a new patient and what types of options they offer for patients who are self-pay. Some doctors offer discounts or will set up payment plans for their patients who do not have insurance, but you will need to ask so you aren't surprised when you get to your appointment. ° °2) Contact Your Local Health Department °Not all health departments have doctors that can see patients for sick visits, but many do, so it is worth a call to see if yours does. If you don't know where your local health department is, you can check in your phone book. The CDC also has a tool to help you locate your state's health department, and many state websites also have listings of all of their local health departments. ° °3) Find a Walk-in Clinic °If your illness is not likely to be very severe or complicated, you may want to try a walk in clinic. These are popping up all over the country in pharmacies, drugstores, and shopping centers. They're usually staffed by nurse practitioners or physician assistants that have been trained to treat common illnesses and complaints. They're usually fairly quick and inexpensive. However, if you have serious medical issues or chronic medical problems, these are probably not your best option. ° °No Primary Care Doctor: °- Call Health Connect at  832-8000 - they can help you locate a primary care doctor that  accepts your insurance, provides certain services, etc. °- Physician Referral Service- 1-800-533-3463 ° °Chronic Pain Problems: °Organization         Address  Phone   Notes  °[facility]  ([phone]) [phone] Patients need to be referred by their primary care doctor.  ° °Medication  Assistance: °Organization         Address  Phone   Notes  °[place] [facility] [address] °[place], [place] [address] (336) 641-8030 --Must be a resident of [place] °-- Must have NO insurance coverage whatsoever (no Medicaid/ Medicare, etc.) °-- The pt. MUST have a primary care doctor that directs their care regularly and follows them in the community °  °[facility]  (866) 331-1348   °[employer]  (888) 892-1162   ° °Agencies that provide inexpensive medical care: °Organization         Address  Phone   Notes  °[facility]  (336) 832-8035   °[facility]    (336) 832-7272   °[facility] [address] °[place],  [address] (336) 832-4777   °[facility] [address], °[place] (336) 271-4999   °[facility]    (336) 373-0678   °[facility]    (336) 272-1050   °[facility] ° [address], [place] Phone:  (336) 832-4444, Fax:  (336) 832-4440 Hours of Operation:  9 am - 6 pm, M-F.  Also accepts Medicaid/Medicare and self-pay.  °[facility] ° [address], [place] Phone: (336) [phone], Fax: (336) [phone]. Hours of Operation:  8:30 am - 5:30 pm, M-F.  Also accepts Medicaid and self-pay.  °[facility] [place] [address], [place] Phone: (336) 878-6027   °[facility] [address], [place], [place] (336)723-1848, Ext. 123 Mondays & Thursdays: 7-9 AM.  First 15 patients are seen on a first come, first serve basis. °  ° °Medicaid-accepting [place] Providers: ° °Organization         Address  Phone   Notes  °[facility] [address], [place] (336) 641-2100 Also accepts self-pay patients.  °[facility] [address], [place] ° (336) 856-9996   °[facility] [address], [place]  (336) 288-8857   °[facility] [address] [place] [address], [place] (336) 299-7000   °[facility] [address], [place]  ° (336) 373-1557 Only accepts [place] Access Medicaid patients after they have their name applied to their card.  ° °Self-Pay (no insurance) in [place]: ° °Organization         Address  Phone   Notes  °Sickle Cell Patients, [facility] [address], [place] (336) 832-1970   °[facility] [address], [place] (336) 832-4400   °[facility] [place] ° [address], [place] (336) 992-4800   °[facility]/Dr. Osei-Bonsu ° [address] [place] [address], [place] or [address], [place] (336) [phone] Phone number for both [place] and [place] locations is the same.  °[facility] [address], [place] (336) 299-0000   °[facility] [address] [place] [address], [place] or [address] (336) 852-7530 °(336) 878-2260   °[facility] [address], [place] (336) 350-1642, phone; (336) 294-5005, fax Sees patients 1st and 3rd Saturday of every month.  Must not qualify for public or private insurance (i.e. Medicaid, Medicare, [place] Health Choice, Veterans' Benefits) • Household income should be no more than 200% of the poverty level •The clinic cannot treat you if you are pregnant or think you are pregnant • Sexually transmitted diseases are not treated at the clinic.  ° ° °Dental Care: °Organization         Address  Phone  Notes  °[place] [facility] [address], [place] (336) 641-6152 Accepts children up to age 21 who are enrolled in Medicaid or [place] Health Choice; pregnant women with a Medicaid card; and children who have applied for Medicaid or [place] Health Choice, but were declined, whose parents can pay a reduced fee at time of service.  °[place]  [facility] [place]  [address], [place] (336) 641-7733 Accepts children up to age 21 who are enrolled in Medicaid or [place] Health Choice; pregnant women with a Medicaid card; and children who have applied for Medicaid or  Health Choice, but were declined, whose parents can pay a reduced fee at time of service.  °[facility] ° [address], [place] (336) 641-4533 Patients are seen by appointment only. Walk-ins are not accepted. [facility] will see patients 18 years of age and older. °Monday - Tuesday (8am-5pm) °Most Wednesdays (8:30-5pm) °$30 per visit, cash only  °[facility] ° [address], [place] (336) [phone] Patients are seen by appointment only. Walk-ins are not accepted. [facility] will see patients 18 years of age and older. °One   Wednesday Evening (Monthly: Volunteer Based).  $30 per visit, cash only  °[facility]  (919) 537-3737 for adults; Children under age 4, call Graduate Pediatric Dentistry at ([phone]) [phone]. Children aged 4-14, please call ([phone]) [phone] to request a pediatric application. ° Dental services are provided in all areas of dental care including fillings, crowns and bridges, complete and partial dentures, implants, gum treatment, root canals, and extractions. Preventive care is also provided. Treatment is provided to both adults and children. °Patients are selected via a lottery and there is often a waiting list. °  °[facility] [address], °[place] ° (336) 763-8833 [email or web address] °  °[facility] [address], [place], [place] (336)723-1848, Ext. 123 Second and Fourth Thursday of each month, opens at 6:30 AM; Clinic ends at 9 AM.  Patients are seen on a first-come first-served basis, and a limited number are seen during each clinic.  ° °[facility] ° [address], [place], [place] (336) 723-7904    Eligibility Requirements °You must have lived in [place], [place], or [place] counties for at least the last three months. °  You cannot be eligible for state or federal sponsored healthcare insurance, including Veterans Administration, Medicaid, or Medicare. °  You generally cannot be eligible for healthcare insurance through your employer.  °  How to apply: °Eligibility screenings are held every Tuesday and Wednesday afternoon from 1:00 pm until 4:00 pm. You do not need an appointment for the interview!  °[facility] [address], [place], [place] 336-631-2330   °[facility]  336-342-8273   °[facility]  336-703-3100   °[facility]  336-570-6415   ° °Behavioral Health Resources in the Community: °Intensive Outpatient Programs °Organization         Address  Phone  Notes  °[facility] [address], [place], [place] 336-878-6098   °[facility] [address], [place], [place] 336-832-9800   °[facility] [address], [place], [place] ° 336-882-2125   °[facility] [address],  °[place], [place] 1-800-853-5163 or 336-641-4981   °Substance Abuse Resources °Organization         Address  Phone  Notes  °[facility]  336-882-2125   °[facility]  336-784-9470   °[facility]  336-285-9073   °[facility]  336-845-3988   °Residential & Outpatient Substance Abuse Program  1-800-659-3381   °Psychological Services °Organization         Address  Phone  Notes  °[facility]  336- 832-9600   °[facility]  336- 378-7881   °[facility] [address], [place] 1-800-853-5163 or 336-641-4981   ° °Mobile Crisis Teams °Organization         Address  Phone  Notes  °[facility], Mobile Crisis Care Unit  1-877-626-1772   °Assertive °[facility] ° [address]  [place], [place] 336-834-9664   °[facility] [address] °[place] 336-554-5454   ° °Self-Help/Support Groups °Organization         Address  Phone             Notes  °[facility] [place] - variety of support groups  336- 373-1402 Call for more information  °Narcotics Anonymous (NA), [facility] [address], °[place]   2 meetings at this location  ° °  Residential Treatment Programs Organization         Address  Phone  Notes  [facility] [address]    [place]  1-(775)806-9528   [facility]  [address] [place] [address], [place], [place]   [facility] [place], [place] 305-030-1868 Admissions: 8am-3pm M-F  [facility] [place] [address],    [place], [place] [phone]   [facility] [address] [place], [place], [place]   [facility] [address],  [place], [place]   [facility] - Intensive Outpatient [address], [place], [place]   [facility] ([address]  [place], [place] 1-9030492974 or 504-538-8486   [facility] [address], [place], [place] Accepts Medicaid  [facility] [address],  [place] 1-(816)569-6877 Substance Abuse/Addiction Treatment   [facility] Organization         Address  Phone  Notes  [facility]  364-258-6830   Domenic Schwab, PhD [address] [place], [place]   802-188-7708 or (952)670-8064   [facility] [address] [place], [place] (949)825-0718   [facility] [address] [place], [place] [phone] Insurance/Medicaid/sponsorship through [facility] and Families [address]                                    [place], [place] (870) [phone] [place] [address], [place] (209) [phone]    Dr. Adele Schilder  [phone]   [facility] Dept. 1) [address], [place] 2) [place] 3)  [address], [place] [phone]  [phone]   [place] [phone] or [phone] (After Hours)       Take the prescriptions as directed.  Apply moist heat or ice to the area(s) of discomfort, for 15 minutes at a time, several times per day for the next few days.  Do not fall asleep on a heating or ice pack.  Take over the counter tylenol, ibuprofen (OR Excedrin) and benadryl, as directed on packaging, with the anti-emetic prescription (phenergan) given to you today, as needed for headache.  Do not take extra tylenol when you take the hydrocodone prescription because it already has tylenol in it.  Call your regular medical doctor tomorrow to schedule a follow up appointment within the next 2 days.  Return to the Emergency Department immediately sooner if worsening.

## 2013-07-17 NOTE — ED Notes (Addendum)
Patient c/o neck pain. Per patient has hx of herniated disc in which he has appointment next week to possibly have surgery. Patient also reports some nausea. Patient reports taking blood pressure medication and zofran to help with pain and nausea. Patient also reports "migraine headache starting" due to pain.

## 2013-07-17 NOTE — ED Provider Notes (Signed)
CSN: [id number]     Arrival date & time 07/17/13  1826 History   First MD Initiated Contact with Patient 07/17/13 1943     Chief Complaint  Patient presents with  . Neck Pain  . Headache      HPI Pt was seen at 1950. Per pt, c/o gradual onset and persistence of constant acute flair of his chronic migraine headache for the past day.  Describes the headache as per his usual chronic migraine headache pain pattern for the past 12+ years. Denies headache was sudden or maximal in onset or at any time.  Denies visual changes, no focal motor weakness, no tingling/numbness in extremities, no fevers, no rash. Per pt, c/o gradual onset and persistence of constant acute flair of his chronic neck "pain" for the past several days.  Denies any change in his usual chronic pain pattern.  Pain worsens with palpation of the area and body position changes. States he is going to see a Neurosurgeon this week to "see if I need surgery on it." Denies incont/retention of bowel or bladder, no saddle anesthesia, no focal motor weakness, no tingling/numbness in extremities, no fevers, no injury, no abd pain. The patient has a significant history of similar symptoms previously, recently being evaluated for this complaint and multiple prior evals for same.    Past Medical History  Diagnosis Date  . DDD (degenerative disc disease)   . Shingles   . Renal calculi   . Migraines     since 2002  . Hypertension   . IBS (irritable bowel syndrome)   . Anxiety   . Chronic pain   . Allergy   . Herniated cervical disc   . Chronic neck pain    Past Surgical History  Procedure Laterality Date  . Lithotripsy     Family History  Problem Relation Age of Onset  . Headache Mother   . Prostate cancer Father    History  Substance Use Topics  . Smoking status: Never Smoker   . Smokeless tobacco: Never Used  . Alcohol Use: No     Comment: rarely    Review of Systems ROS: Statement: All systems negative except as marked or  noted in the HPI; Constitutional: Negative for fever and chills. ; ; Eyes: Negative for eye pain, redness and discharge. ; ; ENMT: Negative for ear pain, hoarseness, nasal congestion, sinus pressure and sore throat. ; ; Cardiovascular: Negative for chest pain, palpitations, diaphoresis, dyspnea and peripheral edema. ; ; Respiratory: Negative for cough, wheezing and stridor. ; ; Gastrointestinal: Negative for nausea, vomiting, diarrhea, abdominal pain, blood in stool, hematemesis, jaundice and rectal bleeding. . ; ; Genitourinary: Negative for dysuria, flank pain and hematuria. ; ; Musculoskeletal: +chronic neck pain. Negative for back pain. Negative for swelling and trauma.; ; Skin: Negative for pruritus, rash, abrasions, blisters, bruising and skin lesion.; ; Neuro: +chronic headache. Negative for lightheadedness and neck stiffness. Negative for weakness, altered level of consciousness , altered mental status, extremity weakness, paresthesias, involuntary movement, seizure and syncope.      Allergies  Erythromycin  Home Medications   Prior to Admission medications   Medication Sig Start Date End Date Taking? Authorizing Provider  eletriptan (RELPAX) 40 MG tablet One tablet by mouth at onset of headache. May repeat in 2 hours if headache persists or recurs. may repeat in 2 hours if necessary as needed for migraines   Yes Historical Provider, MD  imipramine (TOFRANIL) 50 MG tablet Take 50 mg by mouth at  bedtime.   Yes Historical Provider, MD  lisinopril (PRINIVIL,ZESTRIL) 20 MG tablet Take 20 mg by mouth.   Yes Historical Provider, MD  meloxicam (MOBIC) 7.5 MG tablet Take 7.5 mg by mouth daily.   Yes Historical Provider, MD  propranolol (INDERAL) 20 MG tablet Take 40 mg by mouth 2 (two) times daily. 2 x 20mg  tabs PO twice a day (total 40mg  BID)   Yes Historical Provider, MD  tiZANidine (ZANAFLEX) 4 MG tablet Take 4 mg by mouth every 6 (six) hours as needed. For muscle spasms   Yes Historical Provider,  MD  ondansetron (ZOFRAN) 4 MG tablet Take 1 tablet (4 mg total) by mouth every 8 (eight) hours as needed for nausea or vomiting. 04/21/13   Leandrew Koyanagi, MD   BP 138/109  Pulse 101  Temp(Src) 98.1 F (36.7 C) (Oral)  Resp 18  Ht 5\' 10"  (1.778 m)  Wt 200 lb (90.719 kg)  BMI 28.70 kg/m2  SpO2 99% Physical Exam 1955: Physical examination:  Nursing notes reviewed; Vital signs and O2 SAT reviewed;  Constitutional: Well developed, Well nourished, Well hydrated, In no acute distress; Head:  Normocephalic, atraumatic; Eyes: EOMI, PERRL, No scleral icterus; ENMT: TM's clear bilat. Mouth and pharynx normal, Mucous membranes moist; Neck: Supple, Full range of motion, No lymphadenopathy; Cardiovascular: Regular rate and rhythm, No murmur, rub, or gallop; Respiratory: Breath sounds clear & equal bilaterally, No rales, rhonchi, wheezes.  Speaking full sentences with ease, Normal respiratory effort/excursion; Chest: Nontender, Movement normal; Abdomen: Soft, Nontender, Nondistended, Normal bowel sounds; Genitourinary: No CVA tenderness; Spine:  No midline CS, TS, LS tenderness. +TTP cervical paraspinal muscles. No rash.;; Extremities: Pulses normal, No tenderness, No edema, No calf edema or asymmetry.; Neuro: AA&Ox3, Major CN grossly intact. No facial droop. Speech clear. No gross focal motor or sensory deficits in extremities. Climbs on and off stretcher easily by himself. Gait steady.; Skin: Color normal, Warm, Dry.   ED Course  Procedures     MDM  MDM Reviewed: previous chart, vitals and nursing note    2040:  Pt feels better after meds and wants to go home now.  Dx d/w pt.  Questions answered.  Verb understanding, agreeable to d/c home with outpt f/u.    Alfonzo Feller, DO 07/20/13 2136

## 2013-07-21 ENCOUNTER — Encounter (HOSPITAL_COMMUNITY): Payer: Self-pay | Admitting: Emergency Medicine

## 2013-07-21 ENCOUNTER — Emergency Department (HOSPITAL_COMMUNITY)
Admission: EM | Admit: 2013-07-21 | Discharge: 2013-07-21 | Disposition: A | Discharge status: Home / Self Care | Payer: 59 | Attending: Emergency Medicine | Admitting: Emergency Medicine

## 2013-07-21 DIAGNOSIS — K589 Irritable bowel syndrome without diarrhea: Secondary | ICD-10-CM | POA: Insufficient documentation

## 2013-07-21 DIAGNOSIS — G43909 Migraine, unspecified, not intractable, without status migrainosus: Secondary | ICD-10-CM | POA: Insufficient documentation

## 2013-07-21 DIAGNOSIS — Z8619 Personal history of other infectious and parasitic diseases: Secondary | ICD-10-CM | POA: Insufficient documentation

## 2013-07-21 DIAGNOSIS — M542 Cervicalgia: Secondary | ICD-10-CM | POA: Insufficient documentation

## 2013-07-21 DIAGNOSIS — Z79899 Other long term (current) drug therapy: Secondary | ICD-10-CM | POA: Insufficient documentation

## 2013-07-21 DIAGNOSIS — G8929 Other chronic pain: Secondary | ICD-10-CM | POA: Insufficient documentation

## 2013-07-21 DIAGNOSIS — IMO0002 Reserved for concepts with insufficient information to code with codable children: Secondary | ICD-10-CM | POA: Insufficient documentation

## 2013-07-21 DIAGNOSIS — F411 Generalized anxiety disorder: Secondary | ICD-10-CM | POA: Insufficient documentation

## 2013-07-21 DIAGNOSIS — Z87442 Personal history of urinary calculi: Secondary | ICD-10-CM | POA: Insufficient documentation

## 2013-07-21 DIAGNOSIS — I10 Essential (primary) hypertension: Secondary | ICD-10-CM | POA: Insufficient documentation

## 2013-07-21 DIAGNOSIS — Z791 Long term (current) use of non-steroidal anti-inflammatories (NSAID): Secondary | ICD-10-CM | POA: Insufficient documentation

## 2013-07-21 MED ORDER — HYDROMORPHONE HCL PF 1 MG/ML IJ SOLN
1.0000 mg | Freq: Once | INTRAMUSCULAR | Status: DC
  Filled 2013-07-21: qty 1

## 2013-07-21 MED ORDER — HYDROMORPHONE HCL PF 1 MG/ML IJ SOLN
0.5000 mg | Freq: Once | INTRAMUSCULAR | Status: AC
  Administered 2013-07-21: 0.5 mg via INTRAMUSCULAR
  Filled 2013-07-21: qty 1

## 2013-07-21 NOTE — ED Notes (Signed)
PA at bedside.

## 2013-07-21 NOTE — ED Provider Notes (Signed)
CSN: [id number]     Arrival date & time 07/21/13  0805 History   First MD Initiated Contact with Patient 07/21/13 [phone]     Chief Complaint  Patient presents with  . Neck Pain     (Consider location/radiation/quality/duration/timing/severity/associated sxs/prior Treatment) HPI Comments: 38 year old male with a past medical history degenerative disc disease, migraines, hypertension, anxiety, chronic neck pain, herniated cervical disc at C5-C6 presents to the emergency department complaining of worsening neck pain over the past few months becoming even more severe over the past few days. He was seen at [facility] 4 days ago on July 1 and was given a prescription for Vicodin and Phenergan which she has been taking with no relief. States he also has muscle relaxers at home which are "not touching his pain". Also reports "percocet does not touch the pain". Patient states a few weeks ago he was playing at the beach with his son and believes he may have worsened his bulging discs. States had an MRI one year ago confirming the bulging discs, however on chart review the MRI results shows degenerative disc disease without evidence of disc bulge. Patient states his primary care physician is referring him to the spine specialist Dr. Louanne Skye. He has had cortisone injections in the past with minimal relief. Patient reports that the last emergency department visit he was given a shot of Toradol, Benadryl and Phenergan with no relief. He would like a repeat MRI. States he has pain, numbness and tingling radiating down his left arm into his thumb and first finger. Pain currently described as severe making it difficult for him to sleep. Denies fevers, history of IV drug abuse, loss of control of bowels or bladder or saddle anesthesia.  Patient is a 38 y.o. male presenting with neck pain. The history is provided by the patient and the spouse.  Neck Pain Associated symptoms: numbness     Past Medical History  Diagnosis  Date  . DDD (degenerative disc disease)   . Shingles   . Renal calculi   . Migraines     since 2002  . Hypertension   . IBS (irritable bowel syndrome)   . Anxiety   . Chronic pain   . Allergy   . Herniated cervical disc   . Chronic neck pain    Past Surgical History  Procedure Laterality Date  . Lithotripsy     Family History  Problem Relation Age of Onset  . Headache Mother   . Prostate cancer Father    History  Substance Use Topics  . Smoking status: Never Smoker   . Smokeless tobacco: Never Used  . Alcohol Use: No     Comment: rarely    Review of Systems  Musculoskeletal: Positive for neck pain.  Neurological: Positive for numbness.  All other systems reviewed and are negative.     Allergies  Erythromycin  Home Medications   Prior to Admission medications   Medication Sig Start Date End Date Taking? Authorizing Provider  eletriptan (RELPAX) 40 MG tablet One tablet by mouth at onset of headache. May repeat in 2 hours if headache persists or recurs. may repeat in 2 hours if necessary as needed for migraines    Historical Provider, MD  HYDROcodone-acetaminophen (NORCO/VICODIN) 5-325 MG per tablet 1 or 2 tabs PO q6 hours prn pain 07/17/13   Alfonzo Feller, DO  imipramine (TOFRANIL) 50 MG tablet Take 50 mg by mouth at bedtime.    Historical Provider, MD  lisinopril (PRINIVIL,ZESTRIL) 20 MG  tablet Take 20 mg by mouth.    Historical Provider, MD  meloxicam (MOBIC) 7.5 MG tablet Take 7.5 mg by mouth daily.    Historical Provider, MD  ondansetron (ZOFRAN) 4 MG tablet Take 1 tablet (4 mg total) by mouth every 8 (eight) hours as needed for nausea or vomiting. 04/21/13   Leandrew Koyanagi, MD  promethazine (PHENERGAN) 25 MG tablet Take 1 tablet (25 mg total) by mouth every 6 (six) hours as needed for nausea or vomiting. 07/17/13   Alfonzo Feller, DO  propranolol (INDERAL) 20 MG tablet Take 40 mg by mouth 2 (two) times daily. 2 x 20mg  tabs PO twice a day (total 40mg  BID)     Historical Provider, MD  tiZANidine (ZANAFLEX) 4 MG tablet Take 4 mg by mouth every 6 (six) hours as needed. For muscle spasms    Historical Provider, MD   BP 134/96  Pulse 104  Temp(Src) 98.1 F (36.7 C) (Oral)  Resp 18  SpO2 96% Physical Exam  Nursing note and vitals reviewed. Constitutional: He is oriented to person, place, and time. He appears well-developed and well-nourished. No distress.  HENT:  Head: Normocephalic and atraumatic.  Mouth/Throat: Oropharynx is clear and moist.  Eyes: Conjunctivae and EOM are normal. Pupils are equal, round, and reactive to light.  Neck: Normal range of motion. Neck supple.  Cardiovascular: Normal rate, regular rhythm, normal heart sounds and intact distal pulses.   Pulmonary/Chest: Effort normal and breath sounds normal. No respiratory distress. He exhibits no swelling.    Abdominal: Soft. Bowel sounds are normal. There is no tenderness.  Musculoskeletal: He exhibits no edema.  Generalized tenderness across neck. Full range of motion, slow, pain noted with lateral rotation. No meningeal signs.  Neurological: He is alert and oriented to person, place, and time. He has normal strength. No sensory deficit.  Speech fluent, goal oriented. Moves limbs without ataxia. Strength RUE 5/5, LUE 4/5. Sensation intact.  Skin: Skin is warm and dry. He is not diaphoretic.  Psychiatric: He has a normal mood and affect. His behavior is normal.    ED Course  Procedures (including critical care time) Labs Review Labs Reviewed - No data to display  Imaging Review No results found.   EKG Interpretation None      MDM   Final diagnoses:  Chronic neck pain   Pt presenting with exacerbation of chronic neck pain. He is well appearing and in no apparent distress. Afebrile, vital signs stable. No tachycardia on my exam. No red flags concerning patient's neck pain. Afebrile, no signs of central cord compression. Minimal strength difference of left upper  and right upper extremity. Sensation intact. He is requesting an MRI. I discussed emergent causes for MRI which patient does not present with, I advised him to followup with his primary care physician in the spine specialist to obtain an MRI. Plain x-rays of her neck, patient and wife refused. He was given a prescription for Vicodin at his last visit. He has muscle relaxers at home. I told patient I would be able to treat his pain in the emergency department, however he can take the medications that he has been prescribed at home. Patient stable for discharge. Return precautions given. Patient states understanding of treatment care plan and is agreeable.  At discharge, nor 07/21/2018 the patient was complaining of midsternal pain when he presses on it. On evaluation, he has mild tenderness to xiphoid process. No abdominal tenderness. No swelling. No chest pain or shortness of  breath. Stable for discharge. Discussed conservative measures.  Illene Labrador, PA-C 07/21/13 [phone]

## 2013-07-21 NOTE — ED Provider Notes (Signed)
Medical screening examination/treatment/procedure(s) were performed by non-physician practitioner and as supervising physician I was immediately available for consultation/collaboration.   EKG Interpretation None        Houston Siren III, MD 07/21/13 1630

## 2013-07-21 NOTE — ED Notes (Signed)
Pt states he has herniated disc and had MRI about 1 year ago. Pt states it has gotten worse over the past couple of months. Pt states his left jaw and beginning to hurt as well. Was seen at [facility] on the 1st but states he didn't receive any x-rays or oictures.

## 2013-07-21 NOTE — Discharge Instructions (Signed)
Follow up with your primary care doctor and Dr. Louanne Skye.  Chronic Pain Chronic pain can be defined as pain that is off and on and lasts for 3-6 months or longer. Many things cause chronic pain, which can make it difficult to make a diagnosis. There are many treatment options available for chronic pain. However, finding a treatment that works well for you may require trying various approaches until the right one is found. Many people benefit from a combination of two or more types of treatment to control their pain. SYMPTOMS  Chronic pain can occur anywhere in the body and can range from mild to very severe. Some types of chronic pain include:  Headache.  Low back pain.  Cancer pain.  Arthritis pain.  Neurogenic pain. This is pain resulting from damage to nerves. People with chronic pain may also have other symptoms such as:  Depression.  Anger.  Insomnia.  Anxiety. DIAGNOSIS  Your health care provider will help diagnose your condition over time. In many cases, the initial focus will be on excluding possible conditions that could be causing the pain. Depending on your symptoms, your health care provider may order tests to diagnose your condition. Some of these tests may include:   Blood tests.   CT scan.   MRI.   X-rays.   Ultrasounds.   Nerve conduction studies.  You may need to see a specialist.  TREATMENT  Finding treatment that works well may take time. You may be referred to a pain specialist. He or she may prescribe medicine or therapies, such as:   Mindful meditation or yoga.  Shots (injections) of numbing or pain-relieving medicines into the spine or area of pain.  Local electrical stimulation.  Acupuncture.   Massage therapy.   Aroma, color, light, or sound therapy.   Biofeedback.   Working with a physical therapist to keep from getting stiff.   Regular, gentle exercise.   Cognitive or behavioral therapy.   Group support.   Sometimes, surgery may be recommended.  HOME CARE INSTRUCTIONS   Take all medicines as directed by your health care provider.   Lessen stress in your life by relaxing and doing things such as listening to calming music.   Exercise or be active as directed by your health care provider.   Eat a healthy diet and include things such as vegetables, fruits, fish, and lean meats in your diet.   Keep all follow-up appointments with your health care provider.   Attend a support group with others suffering from chronic pain. SEEK MEDICAL CARE IF:   Your pain gets worse.   You develop a new pain that was not there before.   You cannot tolerate medicines given to you by your health care provider.   You have new symptoms since your last visit with your health care provider.  SEEK IMMEDIATE MEDICAL CARE IF:   You feel weak.   You have decreased sensation or numbness.   You lose control of bowel or bladder function.   Your pain suddenly gets much worse.   You develop shaking.  You develop chills.  You develop confusion.  You develop chest pain.  You develop shortness of breath.  MAKE SURE YOU:  Understand these instructions.  Will watch your condition.  Will get help right away if you are not doing well or get worse. Document Released: 09/25/2001 Document Revised: 09/05/2012 Document Reviewed: 06/29/2012 [facility] Patient Information 2015 [place], [place]. This information is not intended to replace advice given to  you by your health care provider. Make sure you discuss any questions you have with your health care provider.

## 2013-07-21 NOTE — ED Notes (Signed)
Pt c/o sternal pain when pressing. PA Robyn notifed.

## 2013-07-26 ENCOUNTER — Other Ambulatory Visit: Payer: Self-pay | Admitting: Specialist

## 2013-07-26 ENCOUNTER — Other Ambulatory Visit (HOSPITAL_COMMUNITY): Payer: Self-pay | Admitting: Specialist

## 2013-07-26 DIAGNOSIS — R52 Pain, unspecified: Secondary | ICD-10-CM

## 2013-07-27 ENCOUNTER — Ambulatory Visit (HOSPITAL_BASED_OUTPATIENT_CLINIC_OR_DEPARTMENT_OTHER): Payer: 59

## 2013-07-29 ENCOUNTER — Ambulatory Visit
Admission: RE | Admit: 2013-07-29 | Discharge: 2013-07-29 | Disposition: A | Discharge status: Home / Self Care | Payer: 59 | Source: Ambulatory Visit | Attending: Specialist | Admitting: Specialist

## 2013-07-29 DIAGNOSIS — R52 Pain, unspecified: Secondary | ICD-10-CM

## 2013-07-30 ENCOUNTER — Other Ambulatory Visit (HOSPITAL_COMMUNITY): Payer: Self-pay | Admitting: Specialist

## 2013-08-06 ENCOUNTER — Other Ambulatory Visit: Payer: Self-pay | Admitting: Neurological Surgery

## 2013-08-07 ENCOUNTER — Other Ambulatory Visit: Payer: Self-pay | Admitting: Neurological Surgery

## 2013-08-08 ENCOUNTER — Encounter (HOSPITAL_COMMUNITY): Payer: Self-pay

## 2013-08-09 ENCOUNTER — Other Ambulatory Visit (HOSPITAL_COMMUNITY): Payer: Self-pay | Admitting: *Deleted

## 2013-08-09 ENCOUNTER — Encounter (HOSPITAL_COMMUNITY): Payer: Self-pay

## 2013-08-09 ENCOUNTER — Encounter (HOSPITAL_COMMUNITY)
Admission: RE | Admit: 2013-08-09 | Discharge: 2013-08-09 | Disposition: A | Discharge status: Home / Self Care | Payer: 59 | Source: Ambulatory Visit | Attending: Neurological Surgery | Admitting: Neurological Surgery

## 2013-08-09 DIAGNOSIS — Z01818 Encounter for other preprocedural examination: Secondary | ICD-10-CM | POA: Insufficient documentation

## 2013-08-09 DIAGNOSIS — Z01812 Encounter for preprocedural laboratory examination: Secondary | ICD-10-CM | POA: Insufficient documentation

## 2013-08-09 HISTORY — DX: Sleep apnea, unspecified: G47.30

## 2013-08-09 LAB — CBC WITH DIFFERENTIAL/PLATELET
Basophils Absolute: 0 10*3/uL (ref 0.0–0.1)
Basophils Relative: 0 % (ref 0–1)
Eosinophils Absolute: 0.1 10*3/uL (ref 0.0–0.7)
Eosinophils Relative: 2 % (ref 0–5)
HCT: 43 % (ref 39.0–52.0)
Hemoglobin: 15 g/dL (ref 13.0–17.0)
Lymphocytes Relative: 26 % (ref 12–46)
Lymphs Abs: 2 10*3/uL (ref 0.7–4.0)
MCH: 30.7 pg (ref 26.0–34.0)
MCHC: 34.9 g/dL (ref 30.0–36.0)
MCV: 87.9 fL (ref 78.0–100.0)
Monocytes Absolute: 0.5 10*3/uL (ref 0.1–1.0)
Monocytes Relative: 7 % (ref 3–12)
Neutro Abs: 4.9 10*3/uL (ref 1.7–7.7)
Neutrophils Relative %: 65 % (ref 43–77)
Platelets: 307 10*3/uL (ref 150–400)
RBC: 4.89 MIL/uL (ref 4.22–5.81)
RDW: 12.9 % (ref 11.5–15.5)
WBC: 7.6 10*3/uL (ref 4.0–10.5)

## 2013-08-09 LAB — BASIC METABOLIC PANEL
Anion gap: 14 (ref 5–15)
BUN: 10 mg/dL (ref 6–23)
CO2: 29 mEq/L (ref 19–32)
Calcium: 9.9 mg/dL (ref 8.4–10.5)
Chloride: 97 mEq/L (ref 96–112)
Creatinine, Ser: 0.76 mg/dL (ref 0.50–1.35)
GFR calc Af Amer: >90 mL/min (ref >90)
GFR calc non Af Amer: >90 mL/min (ref >90)
Glucose, Bld: 93 mg/dL (ref 70–99)
Potassium: 4.7 mEq/L (ref 3.7–5.3)
Sodium: 140 mEq/L (ref 137–147)

## 2013-08-09 LAB — PROTIME-INR
INR: 1.02 (ref 0.00–1.49)
Prothrombin Time: 13.4 seconds (ref 11.6–15.2)

## 2013-08-09 LAB — SURGICAL PCR SCREEN
MRSA, PCR: NEGATIVE
Staphylococcus aureus: NEGATIVE

## 2013-08-09 NOTE — Progress Notes (Signed)
Pt's PCP is Dr. Darcus Austin. Pt states he had a sleep study done about 5 years ago, not sure name of facility, but thinks that Dr. Inda Merlin' office would have a copy of the results. I called and left a message with the medical records department at [place] at [facility] requesting a copy of the sleep study be faxed to [place].

## 2013-08-09 NOTE — Pre-Procedure Instructions (Signed)
[name]  08/09/2013   Your procedure is scheduled on:  Thursday, August 15, 2013 at 1:10 PM.   Report to [facility] Entrance "A" Admitting Office at 11:10 AM.   Call this number if you have problems the morning of surgery: [phone]   Remember:   Do not eat food or drink liquids after midnight Wednesday, 08/14/13.   Take these medicines the morning of surgery with A SIP OF WATER: DULoxetine (CYMBALTA), propranolol (INDERAL), oxyCODONE-acetaminophen (PERCOCET/ROXICET) - if needed, ondansetron (ZOFRAN) - if needed, tiZANidine (ZANAFLEX) - if needed.   Do not wear jewelry.  Do not wear lotions, powders, or cologne. You may wear deodorant.  Men may shave face and neck.  Do not bring valuables to the hospital.  [facility] is not responsible                  for any belongings or valuables.               Contacts, dentures or bridgework may not be worn into surgery.  Leave suitcase in the car. After surgery it may be brought to your room.  For patients admitted to the hospital, discharge time is determined by your                treatment team.                 Special Instructions: [place] - Preparing for Surgery  Before surgery, you can play an important role.  Because skin is not sterile, your skin needs to be as free of germs as possible.  You can reduce the number of germs on you skin by washing with CHG (chlorahexidine gluconate) soap before surgery.  CHG is an antiseptic cleaner which kills germs and bonds with the skin to continue killing germs even after washing.  Please DO NOT use if you have an allergy to CHG or antibacterial soaps.  If your skin becomes reddened/irritated stop using the CHG and inform your nurse when you arrive at Short Stay.  Do not shave (including legs and underarms) for at least 48 hours prior to the first CHG shower.  You may shave your face.  Please follow these instructions carefully:   1.  Shower with CHG Soap the night before  surgery and the                                morning of Surgery.  2.  If you choose to wash your hair, wash your hair first as usual with your       normal shampoo.  3.  After you shampoo, rinse your hair and body thoroughly to remove the                      Shampoo.  4.  Use CHG as you would any other liquid soap.  You can apply chg directly       to the skin and wash gently with scrungie or a clean washcloth.  5.  Apply the CHG Soap to your body ONLY FROM THE NECK DOWN.        Do not use on open wounds or open sores.  Avoid contact with your eyes, ears, mouth and genitals (private parts).  Wash genitals (private parts) with your normal soap.  6.  Wash thoroughly, paying special attention to the area where your surgery  will be performed.  7.  Thoroughly rinse your body with warm water from the neck down.  8.  DO NOT shower/wash with your normal soap after using and rinsing off       the CHG Soap.  9.  Pat yourself dry with a clean towel.            10.  Wear clean pajamas.            11.  Place clean sheets on your bed the night of your first shower and do not        sleep with pets.  Day of Surgery  Do not apply any lotions the morning of surgery.  Please wear clean clothes to the hospital/surgery center.     Please read over the following fact sheets that you were given: Pain Booklet, Coughing and Deep Breathing, MRSA Information and Surgical Site Infection Prevention

## 2013-08-14 MED ORDER — DEXAMETHASONE SODIUM PHOSPHATE 10 MG/ML IJ SOLN
10.0000 mg | INTRAMUSCULAR | Status: AC
  Administered 2013-08-15: 10 mg via INTRAVENOUS

## 2013-08-14 MED ORDER — CEFAZOLIN SODIUM-DEXTROSE 2-3 GM-% IV SOLR
2.0000 g | INTRAVENOUS | Status: AC
  Administered 2013-08-15: 2 g via INTRAVENOUS
  Filled 2013-08-14: qty 50

## 2013-08-15 ENCOUNTER — Ambulatory Visit (HOSPITAL_COMMUNITY): Payer: 59

## 2013-08-15 ENCOUNTER — Ambulatory Visit (HOSPITAL_COMMUNITY)
Admission: RE | Admit: 2013-08-15 | Discharge: 2013-08-16 | Disposition: A | Discharge status: Home / Self Care | Payer: 59 | Source: Ambulatory Visit | Attending: Neurological Surgery | Admitting: Neurological Surgery

## 2013-08-15 ENCOUNTER — Encounter (HOSPITAL_COMMUNITY)
Admission: RE | Disposition: A | Discharge status: Home / Self Care | Payer: Self-pay | Source: Ambulatory Visit | Attending: Neurological Surgery

## 2013-08-15 ENCOUNTER — Ambulatory Visit (HOSPITAL_COMMUNITY): Payer: 59 | Admitting: Certified Registered"

## 2013-08-15 ENCOUNTER — Encounter (HOSPITAL_COMMUNITY): Payer: Self-pay | Admitting: Neurological Surgery

## 2013-08-15 ENCOUNTER — Encounter (HOSPITAL_COMMUNITY): Payer: 59 | Admitting: Certified Registered"

## 2013-08-15 DIAGNOSIS — F411 Generalized anxiety disorder: Secondary | ICD-10-CM | POA: Insufficient documentation

## 2013-08-15 DIAGNOSIS — I10 Essential (primary) hypertension: Secondary | ICD-10-CM | POA: Insufficient documentation

## 2013-08-15 DIAGNOSIS — G43909 Migraine, unspecified, not intractable, without status migrainosus: Secondary | ICD-10-CM | POA: Insufficient documentation

## 2013-08-15 DIAGNOSIS — G4730 Sleep apnea, unspecified: Secondary | ICD-10-CM | POA: Insufficient documentation

## 2013-08-15 DIAGNOSIS — Z981 Arthrodesis status: Secondary | ICD-10-CM

## 2013-08-15 DIAGNOSIS — M5020 Other cervical disc displacement, unspecified cervical region: Secondary | ICD-10-CM | POA: Insufficient documentation

## 2013-08-15 DIAGNOSIS — M5030 Other cervical disc degeneration, unspecified cervical region: Secondary | ICD-10-CM | POA: Insufficient documentation

## 2013-08-15 DIAGNOSIS — M47812 Spondylosis without myelopathy or radiculopathy, cervical region: Secondary | ICD-10-CM | POA: Insufficient documentation

## 2013-08-15 DIAGNOSIS — Z791 Long term (current) use of non-steroidal anti-inflammatories (NSAID): Secondary | ICD-10-CM | POA: Insufficient documentation

## 2013-08-15 HISTORY — PX: ANTERIOR CERVICAL DECOMP/DISCECTOMY FUSION: SHX1161

## 2013-08-15 SURGERY — ANTERIOR CERVICAL DECOMPRESSION/DISCECTOMY FUSION 3 LEVELS
Anesthesia: General | Site: Neck

## 2013-08-15 MED ORDER — MIDAZOLAM HCL 5 MG/5ML IJ SOLN
INTRAMUSCULAR | Status: DC | PRN
  Administered 2013-08-15: 2 mg via INTRAVENOUS

## 2013-08-15 MED ORDER — ROCURONIUM BROMIDE 100 MG/10ML IV SOLN
INTRAVENOUS | Status: DC | PRN
  Administered 2013-08-15: 50 mg via INTRAVENOUS

## 2013-08-15 MED ORDER — HYDROMORPHONE HCL PF 1 MG/ML IJ SOLN
INTRAMUSCULAR | Status: AC
  Administered 2013-08-15: 0.5 mg via INTRAVENOUS
  Filled 2013-08-15: qty 1

## 2013-08-15 MED ORDER — DEXAMETHASONE SODIUM PHOSPHATE 4 MG/ML IJ SOLN
4.0000 mg | Freq: Four times a day (QID) | INTRAMUSCULAR | Status: DC
  Administered 2013-08-15: 4 mg via INTRAVENOUS
  Filled 2013-08-15 (×5): qty 1

## 2013-08-15 MED ORDER — MIDAZOLAM HCL 2 MG/2ML IJ SOLN
INTRAMUSCULAR | Status: AC
  Filled 2013-08-15: qty 2

## 2013-08-15 MED ORDER — PROPOFOL 10 MG/ML IV BOLUS
INTRAVENOUS | Status: AC
  Filled 2013-08-15: qty 20

## 2013-08-15 MED ORDER — OXYCODONE HCL 5 MG PO TABS
5.0000 mg | ORAL_TABLET | Freq: Once | ORAL | Status: AC | PRN
  Administered 2013-08-15: 5 mg via ORAL

## 2013-08-15 MED ORDER — PROPRANOLOL HCL 40 MG PO TABS
40.0000 mg | ORAL_TABLET | Freq: Two times a day (BID) | ORAL | Status: DC
  Administered 2013-08-15 – 2013-08-16 (×2): 40 mg via ORAL
  Filled 2013-08-15 (×3): qty 1

## 2013-08-15 MED ORDER — OXYCODONE HCL 5 MG/5ML PO SOLN: 5.0000 mg | Freq: Once | ORAL | Status: AC | PRN

## 2013-08-15 MED ORDER — PHENYLEPHRINE HCL 10 MG/ML IJ SOLN
10.0000 mg | INTRAVENOUS | Status: DC | PRN
  Administered 2013-08-15: 25 ug/min via INTRAVENOUS

## 2013-08-15 MED ORDER — FENTANYL CITRATE 0.05 MG/ML IJ SOLN
INTRAMUSCULAR | Status: DC | PRN
  Administered 2013-08-15: 50 ug via INTRAVENOUS
  Administered 2013-08-15: 100 ug via INTRAVENOUS

## 2013-08-15 MED ORDER — ACETAMINOPHEN 650 MG RE SUPP: 650.0000 mg | RECTAL | Status: DC | PRN

## 2013-08-15 MED ORDER — ONDANSETRON HCL 4 MG/2ML IJ SOLN
INTRAMUSCULAR | Status: AC
  Filled 2013-08-15: qty 2

## 2013-08-15 MED ORDER — NEOSTIGMINE METHYLSULFATE 10 MG/10ML IV SOLN
INTRAVENOUS | Status: AC
  Filled 2013-08-15: qty 1

## 2013-08-15 MED ORDER — NEOSTIGMINE METHYLSULFATE 10 MG/10ML IV SOLN
INTRAVENOUS | Status: DC | PRN
  Administered 2013-08-15: 3 mg via INTRAVENOUS

## 2013-08-15 MED ORDER — ONDANSETRON HCL 4 MG/2ML IJ SOLN
INTRAMUSCULAR | Status: DC | PRN
Start: 2013-08-15 — End: 2013-08-15
  Administered 2013-08-15: 4 mg via INTRAVENOUS

## 2013-08-15 MED ORDER — FENTANYL CITRATE 0.05 MG/ML IJ SOLN
INTRAMUSCULAR | Status: AC
  Filled 2013-08-15: qty 5

## 2013-08-15 MED ORDER — GLYCOPYRROLATE 0.2 MG/ML IJ SOLN
INTRAMUSCULAR | Status: AC
  Filled 2013-08-15: qty 2

## 2013-08-15 MED ORDER — DEXAMETHASONE SODIUM PHOSPHATE 4 MG/ML IJ SOLN
INTRAMUSCULAR | Status: AC
  Filled 2013-08-15: qty 3

## 2013-08-15 MED ORDER — THROMBIN 5000 UNITS EX SOLR
OROMUCOSAL | Status: DC | PRN
  Administered 2013-08-15: 12:00:00 via TOPICAL

## 2013-08-15 MED ORDER — TEMAZEPAM 15 MG PO CAPS
15.0000 mg | ORAL_CAPSULE | Freq: Every day | ORAL | Status: DC
  Administered 2013-08-15: 15 mg via ORAL

## 2013-08-15 MED ORDER — SODIUM CHLORIDE 0.9 % IJ SOLN
3.0000 mL | Freq: Two times a day (BID) | INTRAMUSCULAR | Status: DC
  Administered 2013-08-15: 3 mL via INTRAVENOUS

## 2013-08-15 MED ORDER — DEXAMETHASONE 4 MG PO TABS
4.0000 mg | ORAL_TABLET | Freq: Four times a day (QID) | ORAL | Status: DC
  Administered 2013-08-15 – 2013-08-16 (×3): 4 mg via ORAL
  Filled 2013-08-15 (×6): qty 1

## 2013-08-15 MED ORDER — OXYCODONE-ACETAMINOPHEN 5-325 MG PO TABS
1.0000 | ORAL_TABLET | Freq: Four times a day (QID) | ORAL | Status: DC | PRN
  Administered 2013-08-15: 2 via ORAL
  Filled 2013-08-15: qty 2

## 2013-08-15 MED ORDER — PHENYLEPHRINE HCL 10 MG/ML IJ SOLN
INTRAMUSCULAR | Status: DC | PRN
  Administered 2013-08-15: 120 ug via INTRAVENOUS
  Administered 2013-08-15 (×3): 80 ug via INTRAVENOUS
  Administered 2013-08-15: 40 ug via INTRAVENOUS

## 2013-08-15 MED ORDER — 0.9 % SODIUM CHLORIDE (POUR BTL) OPTIME
TOPICAL | Status: DC | PRN
  Administered 2013-08-15: 1000 mL

## 2013-08-15 MED ORDER — SENNA 8.6 MG PO TABS
1.0000 | ORAL_TABLET | Freq: Two times a day (BID) | ORAL | Status: DC
  Administered 2013-08-15 – 2013-08-16 (×2): 8.6 mg via ORAL
  Filled 2013-08-15 (×3): qty 1

## 2013-08-15 MED ORDER — LACTATED RINGERS IV SOLN
INTRAVENOUS | Status: DC
  Administered 2013-08-15 (×2): via INTRAVENOUS

## 2013-08-15 MED ORDER — MENTHOL 3 MG MT LOZG
1.0000 | LOZENGE | OROMUCOSAL | Status: DC | PRN
  Administered 2013-08-15: 3 mg via ORAL
  Filled 2013-08-15 (×2): qty 9

## 2013-08-15 MED ORDER — IMIPRAMINE HCL 50 MG PO TABS
50.0000 mg | ORAL_TABLET | Freq: Every day | ORAL | Status: DC
  Administered 2013-08-15: 50 mg via ORAL
  Filled 2013-08-15 (×2): qty 1

## 2013-08-15 MED ORDER — ONDANSETRON HCL 4 MG/2ML IJ SOLN
4.0000 mg | INTRAMUSCULAR | Status: DC | PRN
  Administered 2013-08-15 – 2013-08-16 (×2): 4 mg via INTRAVENOUS
  Filled 2013-08-15 (×2): qty 2

## 2013-08-15 MED ORDER — LISINOPRIL 20 MG PO TABS
20.0000 mg | ORAL_TABLET | Freq: Every day | ORAL | Status: DC
  Administered 2013-08-15: 20 mg via ORAL
  Filled 2013-08-15 (×2): qty 1

## 2013-08-15 MED ORDER — THROMBIN 20000 UNITS EX SOLR
CUTANEOUS | Status: DC | PRN
  Administered 2013-08-15: 12:00:00 via TOPICAL

## 2013-08-15 MED ORDER — LIDOCAINE HCL (CARDIAC) 20 MG/ML IV SOLN
INTRAVENOUS | Status: DC | PRN
  Administered 2013-08-15: 60 mg via INTRAVENOUS

## 2013-08-15 MED ORDER — ONDANSETRON HCL 4 MG/2ML IJ SOLN: 4.0000 mg | Freq: Once | INTRAMUSCULAR | Status: DC | PRN

## 2013-08-15 MED ORDER — GLYCOPYRROLATE 0.2 MG/ML IJ SOLN
INTRAMUSCULAR | Status: DC | PRN
  Administered 2013-08-15: 0.4 mg via INTRAVENOUS

## 2013-08-15 MED ORDER — PROPOFOL 10 MG/ML IV BOLUS
INTRAVENOUS | Status: DC | PRN
  Administered 2013-08-15: 170 mg via INTRAVENOUS

## 2013-08-15 MED ORDER — THROMBIN 5000 UNITS EX SOLR
OROMUCOSAL | Status: DC | PRN
  Administered 2013-08-15: 14:00:00 via TOPICAL

## 2013-08-15 MED ORDER — LIDOCAINE HCL (CARDIAC) 20 MG/ML IV SOLN
INTRAVENOUS | Status: AC
  Filled 2013-08-15: qty 5

## 2013-08-15 MED ORDER — POTASSIUM CHLORIDE IN NACL 20-0.9 MEQ/L-% IV SOLN
INTRAVENOUS | Status: DC
  Filled 2013-08-15 (×3): qty 1000

## 2013-08-15 MED ORDER — OXYCODONE-ACETAMINOPHEN 5-325 MG PO TABS
1.0000 | ORAL_TABLET | ORAL | Status: DC | PRN
  Administered 2013-08-15 – 2013-08-16 (×3): 2 via ORAL
  Filled 2013-08-15 (×3): qty 2

## 2013-08-15 MED ORDER — ACETAMINOPHEN 325 MG PO TABS: 650.0000 mg | ORAL_TABLET | ORAL | Status: DC | PRN

## 2013-08-15 MED ORDER — TIZANIDINE HCL 4 MG PO TABS
4.0000 mg | ORAL_TABLET | Freq: Four times a day (QID) | ORAL | Status: DC | PRN
  Administered 2013-08-15 – 2013-08-16 (×2): 4 mg via ORAL
  Filled 2013-08-15 (×3): qty 1

## 2013-08-15 MED ORDER — BACITRACIN 50000 UNITS IM SOLR
INTRAMUSCULAR | Status: DC | PRN
  Administered 2013-08-15: 12:00:00

## 2013-08-15 MED ORDER — SODIUM CHLORIDE 0.9 % IJ SOLN: 3.0000 mL | INTRAMUSCULAR | Status: DC | PRN

## 2013-08-15 MED ORDER — OXYCODONE HCL 5 MG PO TABS
ORAL_TABLET | ORAL | Status: AC
  Filled 2013-08-15: qty 1

## 2013-08-15 MED ORDER — BUPIVACAINE HCL (PF) 0.25 % IJ SOLN
INTRAMUSCULAR | Status: DC | PRN
  Administered 2013-08-15: 4 mL

## 2013-08-15 MED ORDER — PHENOL 1.4 % MT LIQD
1.0000 | OROMUCOSAL | Status: DC | PRN
  Filled 2013-08-15: qty 177

## 2013-08-15 MED ORDER — HYDROMORPHONE HCL PF 1 MG/ML IJ SOLN
0.2500 mg | INTRAMUSCULAR | Status: DC | PRN
  Administered 2013-08-15 (×4): 0.5 mg via INTRAVENOUS

## 2013-08-15 MED ORDER — CEFAZOLIN SODIUM 1-5 GM-% IV SOLN
1.0000 g | Freq: Three times a day (TID) | INTRAVENOUS | Status: AC
  Administered 2013-08-15 (×2): 1 g via INTRAVENOUS
  Filled 2013-08-15: qty 50

## 2013-08-15 MED ORDER — DULOXETINE HCL 60 MG PO CPEP
60.0000 mg | ORAL_CAPSULE | Freq: Every day | ORAL | Status: DC
Start: 2013-08-16 — End: 2013-08-16
  Administered 2013-08-16: 60 mg via ORAL
  Filled 2013-08-15: qty 1

## 2013-08-15 MED ORDER — MORPHINE SULFATE 2 MG/ML IJ SOLN
1.0000 mg | INTRAMUSCULAR | Status: DC | PRN
  Administered 2013-08-15 – 2013-08-16 (×3): 2 mg via INTRAVENOUS
  Filled 2013-08-15 (×3): qty 1

## 2013-08-15 SURGICAL SUPPLY — 66 items
APL SKNCLS STERI-STRIP NONHPOA (GAUZE/BANDAGES/DRESSINGS) ×1
BAG DECANTER FOR FLEXI CONT (MISCELLANEOUS) ×3 IMPLANT
BENZOIN TINCTURE PRP APPL 2/3 (GAUZE/BANDAGES/DRESSINGS) ×3 IMPLANT
BIT DRILL POWER (BIT) IMPLANT
BLADE 10 SAFETY STRL DISP (BLADE) ×1 IMPLANT
BONE MATRIX DEMINERALIZED 1CC (Bone Implant) ×2 IMPLANT
BONE MATRIX OSTEOCEL PRO SM (Bone Implant) ×2 IMPLANT
BUR MATCHSTICK NEURO 3.0 LAGG (BURR) ×3 IMPLANT
CAGE LORDOTIC 8 SM PLUS (Cage) ×2 IMPLANT
CAGE SMALL 7X13X15 (Cage) ×4 IMPLANT
CANISTER SUCT 3000ML (MISCELLANEOUS) ×3 IMPLANT
CLOSURE WOUND 1/2 X4 (GAUZE/BANDAGES/DRESSINGS) ×1
CONT SPEC 4OZ CLIKSEAL STRL BL (MISCELLANEOUS) ×3 IMPLANT
DRAIN SNY WOU 7FLT (WOUND CARE) ×2 IMPLANT
DRAPE C-ARM 42X72 X-RAY (DRAPES) ×6 IMPLANT
DRAPE LAPAROTOMY 100X72 PEDS (DRAPES) ×3 IMPLANT
DRAPE MICROSCOPE LEICA (MISCELLANEOUS) ×3 IMPLANT
DRAPE POUCH INSTRU U-SHP 10X18 (DRAPES) ×3 IMPLANT
DRILL BIT POWER (BIT) ×3
DRSG OPSITE 4X5.5 SM (GAUZE/BANDAGES/DRESSINGS) ×3 IMPLANT
DRSG OPSITE POSTOP 3X4 (GAUZE/BANDAGES/DRESSINGS) ×2 IMPLANT
DRSG TELFA 3X8 NADH (GAUZE/BANDAGES/DRESSINGS) IMPLANT
DURAPREP 6ML APPLICATOR 50/CS (WOUND CARE) ×3 IMPLANT
ELECT COATED BLADE 2.86 ST (ELECTRODE) ×3 IMPLANT
ELECT REM PT RETURN 9FT ADLT (ELECTROSURGICAL) ×3
ELECTRODE REM PT RTRN 9FT ADLT (ELECTROSURGICAL) ×1 IMPLANT
EVACUATOR SILICONE 100CC (DRAIN) ×2 IMPLANT
GAUZE SPONGE 4X4 16PLY XRAY LF (GAUZE/BANDAGES/DRESSINGS) IMPLANT
GLOVE BIO SURGEON STRL SZ8 (GLOVE) ×3 IMPLANT
GLOVE BIOGEL PI IND STRL 7.0 (GLOVE) IMPLANT
GLOVE BIOGEL PI IND STRL 7.5 (GLOVE) IMPLANT
GLOVE BIOGEL PI INDICATOR 7.0 (GLOVE) ×4
GLOVE BIOGEL PI INDICATOR 7.5 (GLOVE) ×2
GLOVE ECLIPSE 7.0 STRL STRAW (GLOVE) ×2 IMPLANT
GLOVE SURG SS PI 6.5 STRL IVOR (GLOVE) ×2 IMPLANT
GLOVE SURG SS PI 7.0 STRL IVOR (GLOVE) ×4 IMPLANT
GOWN STRL REUS W/ TWL LRG LVL3 (GOWN DISPOSABLE) IMPLANT
GOWN STRL REUS W/ TWL XL LVL3 (GOWN DISPOSABLE) ×1 IMPLANT
GOWN STRL REUS W/TWL 2XL LVL3 (GOWN DISPOSABLE) IMPLANT
GOWN STRL REUS W/TWL LRG LVL3 (GOWN DISPOSABLE) ×6
GOWN STRL REUS W/TWL XL LVL3 (GOWN DISPOSABLE) ×6
HALTER HD/CHIN CERV TRACTION D (MISCELLANEOUS) IMPLANT
HEMOSTAT POWDER KIT SURGIFOAM (HEMOSTASIS) ×3 IMPLANT
KIT BASIN OR (CUSTOM PROCEDURE TRAY) ×3 IMPLANT
KIT ROOM TURNOVER OR (KITS) ×3 IMPLANT
NDL HYPO 25X1 1.5 SAFETY (NEEDLE) ×1 IMPLANT
NDL SPNL 20GX3.5 QUINCKE YW (NEEDLE) ×1 IMPLANT
NEEDLE HYPO 25X1 1.5 SAFETY (NEEDLE) ×3 IMPLANT
NEEDLE SPNL 20GX3.5 QUINCKE YW (NEEDLE) ×3 IMPLANT
NS IRRIG 1000ML POUR BTL (IV SOLUTION) ×3 IMPLANT
PACK LAMINECTOMY NEURO (CUSTOM PROCEDURE TRAY) ×3 IMPLANT
PAD ARMBOARD 7.5X6 YLW CONV (MISCELLANEOUS) ×7 IMPLANT
PAD DRESSING TELFA 3X8 NADH (GAUZE/BANDAGES/DRESSINGS) ×1 IMPLANT
PIN DISTRACTION 14MM (PIN) ×4 IMPLANT
PLATE HELIX T 58MM (Plate) ×2 IMPLANT
RUBBERBAND STERILE (MISCELLANEOUS) ×6 IMPLANT
SCREW FIXED SELF TAP 4.0X13MM (Screw) ×16 IMPLANT
SPONGE INTESTINAL PEANUT (DISPOSABLE) ×3 IMPLANT
SPONGE SURGIFOAM ABS GEL 100 (HEMOSTASIS) ×3 IMPLANT
STRIP CLOSURE SKIN 1/2X4 (GAUZE/BANDAGES/DRESSINGS) ×2 IMPLANT
SUT VIC AB 3-0 SH 8-18 (SUTURE) ×5 IMPLANT
SYR 20ML ECCENTRIC (SYRINGE) ×2 IMPLANT
TOWEL OR 17X24 6PK STRL BLUE (TOWEL DISPOSABLE) ×3 IMPLANT
TOWEL OR 17X26 10 PK STRL BLUE (TOWEL DISPOSABLE) ×3 IMPLANT
TRAP SPECIMEN MUCOUS 40CC (MISCELLANEOUS) ×2 IMPLANT
WATER STERILE IRR 1000ML POUR (IV SOLUTION) ×3 IMPLANT

## 2013-08-15 NOTE — Anesthesia Procedure Notes (Signed)
Procedure Name: Intubation Date/Time: 08/15/2013 11:21 AM Performed by: Julian Reil Pre-anesthesia Checklist: Patient identified, Emergency Drugs available, Suction available and Patient being monitored Patient Re-evaluated:Patient Re-evaluated prior to inductionOxygen Delivery Method: Circle system utilized Preoxygenation: Pre-oxygenation with 100% oxygen Intubation Type: IV induction Ventilation: Mask ventilation without difficulty and Oral airway inserted - appropriate to patient size Laryngoscope Size: Mac and 4 Grade View: Grade I Tube type: Oral Tube size: 7.5 mm Number of attempts: 1 Airway Equipment and Method: Stylet Placement Confirmation: ETT inserted through vocal cords under direct vision,  positive ETCO2 and breath sounds checked- equal and bilateral Secured at: 21 cm Tube secured with: Tape Dental Injury: Teeth and Oropharynx as per pre-operative assessment

## 2013-08-15 NOTE — H&P (Signed)
Subjective:   Patient is a 38 y.o. male admitted for cervical fusion. The patient first presented to me with complaints of neck pain, shooting pains in the arm(s) and numbness of the arm(s). Onset of symptoms was several months ago. The pain is described as aching and occurs all day. The pain is rated severe, and is located  Across the neck and radiates to the LUE. The symptoms have been progressive. Symptoms are exacerbated by extending head backwards, and are relieved by none.  Previous work up includes MRI of cervical spine, results: disc bulge at C4-C5, C5-C6 and C6-C7 bilateral.  Past Medical History  Diagnosis Date  . DDD (degenerative disc disease)   . Shingles   . Renal calculi   . Migraines     since 2002  . Hypertension   . IBS (irritable bowel syndrome)   . Anxiety   . Chronic pain   . Allergy   . Herniated cervical disc   . Chronic neck pain   . Sleep apnea     does not wear c-pap    Past Surgical History  Procedure Laterality Date  . Lithotripsy    . Wisdom tooth extraction      Allergies  Allergen Reactions  . Erythromycin Nausea And Vomiting    History  Substance Use Topics  . Smoking status: Never Smoker   . Smokeless tobacco: Never Used  . Alcohol Use: No     Comment: rarely    Family History  Problem Relation Age of Onset  . Headache Mother   . Prostate cancer Father   . Kidney cancer Father   . Kidney Stones Sister   . Migraines Sister    Prior to Admission medications   Medication Sig Start Date End Date Taking? Authorizing Provider  DULoxetine (CYMBALTA) 60 MG capsule Take 60 mg by mouth daily.   Yes Historical Provider, MD  eletriptan (RELPAX) 40 MG tablet One tablet by mouth at onset of headache. May repeat in 2 hours if headache persists or recurs. may repeat in 2 hours if necessary as needed for migraines   Yes Historical Provider, MD  lisinopril (PRINIVIL,ZESTRIL) 20 MG tablet Take 20 mg by mouth.   Yes Historical Provider, MD  meloxicam  (MOBIC) 7.5 MG tablet Take 7.5 mg by mouth daily.   Yes Historical Provider, MD  ondansetron (ZOFRAN) 4 MG tablet Take 1 tablet (4 mg total) by mouth every 8 (eight) hours as needed for nausea or vomiting. 04/21/13  Yes Leandrew Koyanagi, MD  oxyCODONE-acetaminophen (PERCOCET/ROXICET) 5-325 MG per tablet Take 1-2 tablets by mouth every 6 (six) hours as needed for moderate pain or severe pain.   Yes Historical Provider, MD  promethazine (PHENERGAN) 25 MG tablet Take 1 tablet (25 mg total) by mouth every 6 (six) hours as needed for nausea or vomiting. 07/17/13  Yes Alfonzo Feller, DO  propranolol (INDERAL) 20 MG tablet Take 40 mg by mouth 2 (two) times daily.    Yes Historical Provider, MD  temazepam (RESTORIL) 15 MG capsule Take 15 mg by mouth at bedtime.   Yes Historical Provider, MD  tiZANidine (ZANAFLEX) 4 MG tablet Take 4 mg by mouth every 6 (six) hours as needed. For muscle spasms   Yes Historical Provider, MD  triamcinolone (KENALOG) 0.025 % cream Apply 1 application topically 2 (two) times daily as needed (rash).   Yes Historical Provider, MD  imipramine (TOFRANIL) 50 MG tablet Take 50 mg by mouth at bedtime.    Historical  Provider, MD     Review of Systems  Positive ROS: neg  All other systems have been reviewed and were otherwise negative with the exception of those mentioned in the HPI and as above.  Objective: Vital signs in last 24 hours: Temp:  [98.4 F (36.9 C)] 98.4 F (36.9 C) (07/30 0851) Pulse Rate:  [96] 96 (07/30 0851) Resp:  [18] 18 (07/30 0851) BP: (135)/(87) 135/87 mmHg (07/30 0851) SpO2:  [100 %] 100 % (07/30 0851) Weight:  [90.266 kg (199 lb)] 90.266 kg (199 lb) (07/30 0851)  General Appearance: Alert, cooperative, no distress, appears stated age Head: Normocephalic, without obvious abnormality, atraumatic Eyes: PERRL, conjunctiva/corneas clear, EOM's intact      Neck: Supple, symmetrical, trachea midline, Back: Symmetric, no curvature, ROM normal, no CVA  tenderness Lungs:  respirations unlabored Heart: Regular rate and rhythm Abdomen: Soft, non-tender Extremities: Extremities normal, atraumatic, no cyanosis or edema Pulses: 2+ and symmetric all extremities Skin: Skin color, texture, turgor normal, no rashes or lesions  NEUROLOGIC:  Mental status: Alert and oriented x4, no aphasia, good attention span, fund of knowledge and memory  Motor Exam - grossly normal Sensory Exam - grossly normal Reflexes: 1+ Coordination - grossly normal Gait - grossly normal Balance - grossly normal Cranial Nerves: I: smell Not tested  II: visual acuity  OS: nl    OD: nl  II: visual fields Full to confrontation  II: pupils Equal, round, reactive to light  III,VII: ptosis None  III,IV,VI: extraocular muscles  Full ROM  V: mastication Normal  V: facial light touch sensation  Normal  V,VII: corneal reflex  Present  VII: facial muscle function - upper  Normal  VII: facial muscle function - lower Normal  VIII: hearing Not tested  IX: soft palate elevation  Normal  IX,X: gag reflex Present  XI: trapezius strength  5/5  XI: sternocleidomastoid strength 5/5  XI: neck flexion strength  5/5  XII: tongue strength  Normal    Data Review Lab Results  Component Value Date   WBC 7.6 08/09/2013   HGB 15.0 08/09/2013   HCT 43.0 08/09/2013   MCV 87.9 08/09/2013   PLT 307 08/09/2013   Lab Results  Component Value Date   NA 140 08/09/2013   K 4.7 08/09/2013   CL 97 08/09/2013   CO2 29 08/09/2013   BUN 10 08/09/2013   CREATININE 0.76 08/09/2013   GLUCOSE 93 08/09/2013   Lab Results  Component Value Date   INR 1.02 08/09/2013    Assessment:   Cervical neck pain with herniated nucleus pulposus/ spondylosis/ stenosis at C4-5, C5-6, C6-7. Patient has failed conservative therapy. Planned surgery : ACDF with plate C4-5, S1-7, B9-3.  Plan:   I explained the condition and procedure to the patient and answered any questions.  Patient wishes to proceed with  procedure as planned. Understands risks/ benefits/ and expected or typical outcomes.  Maira Christon S 08/15/2013 10:45 AM

## 2013-08-15 NOTE — Anesthesia Postprocedure Evaluation (Signed)
  Anesthesia Post-op Note  Patient: [name]  Procedure(s) Performed: Procedure(s): Anterior Cervical Four-Five/Five-Six/Six-Seven Decompression and Fusion (N/A)  Patient Location: PACU  Anesthesia Type:General  Level of Consciousness: awake, alert  and oriented  Airway and Oxygen Therapy: Patient Spontanous Breathing and Patient connected to nasal cannula oxygen  Post-op Pain: mild  Post-op Assessment: Post-op Vital signs reviewed, Patient's Cardiovascular Status Stable, Respiratory Function Stable, Patent Airway and Pain level controlled  Post-op Vital Signs: stable  Last Vitals:  Filed Vitals:   08/15/13 1715  BP: 110/67  Pulse: 92  Temp: 36.7 C  Resp: 10    Complications: No apparent anesthesia complications

## 2013-08-15 NOTE — Transfer of Care (Signed)
Immediate Anesthesia Transfer of Care Note  Patient: [name]  Procedure(s) Performed: Procedure(s): Anterior Cervical Four-Five/Five-Six/Six-Seven Decompression and Fusion (N/A)  Patient Location: PACU  Anesthesia Type:General  Level of Consciousness: awake, alert , oriented and patient cooperative  Airway & Oxygen Therapy: Patient Spontanous Breathing and Patient connected to nasal cannula oxygen  Post-op Assessment: Report given to PACU RN, Post -op Vital signs reviewed and stable and Patient moving all extremities  Post vital signs: Reviewed and stable  Complications: No apparent anesthesia complications

## 2013-08-15 NOTE — Anesthesia Preprocedure Evaluation (Signed)
Anesthesia Evaluation  Patient identified by MRN, date of birth, ID band Patient awake    Reviewed: Allergy & Precautions, H&P , NPO status , Patient's Chart, lab work & pertinent test results  Airway Mallampati: II TM Distance: <3 FB Neck ROM: Full    Dental  (+) Teeth Intact, Dental Advisory Given   Pulmonary  breath sounds clear to auscultation        Cardiovascular hypertension, Rhythm:Regular Rate:Normal     Neuro/Psych    GI/Hepatic   Endo/Other    Renal/GU      Musculoskeletal   Abdominal   Peds  Hematology   Anesthesia Other Findings   Reproductive/Obstetrics                           Anesthesia Physical Anesthesia Plan  ASA: II  Anesthesia Plan: General   Post-op Pain Management:    Induction: Intravenous  Airway Management Planned: Oral ETT  Additional Equipment:   Intra-op Plan:   Post-operative Plan: Extubation in OR  Informed Consent: I have reviewed the patients History and Physical, chart, labs and discussed the procedure including the risks, benefits and alternatives for the proposed anesthesia with the patient or authorized representative who has indicated his/her understanding and acceptance.   Dental advisory given  Plan Discussed with: CRNA and Anesthesiologist  Anesthesia Plan Comments:         Anesthesia Quick Evaluation

## 2013-08-15 NOTE — Op Note (Signed)
08/15/2013  2:37 PM  PATIENT:  KHI MCMILLEN  38 y.o. 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 Decompressive anterior cervical discectomy C4-5 C5-6 C6-7, 2. Anterior cervical arthrodesis C4-5 C5-6 C6-7 utilizing peek interbody cages packed with local autograft and morcellized allograft, 3. Anterior cervical plating C4-C7 inclusive utilizing a helix translational plate  SURGEON:  Sherley Bounds, MD  ASSISTANTS: Dr. Kathyrn Sheriff  ANESTHESIA:   General  EBL: 100 ml  Total I/O In: 1400 [I.V.:1400] Out: -   BLOOD ADMINISTERED:none  DRAINS: none   SPECIMEN:  No Specimen  INDICATION FOR PROCEDURE: This patient presented with a long history of neck and left arm pain. He tried medical management without relief. MRI showed worsening spondylosis at C4-5 C5-6 and C6-7. Recommended decompressive anterior cervical discectomy fusion plating at C4-5 C5-6 and C6-7. Patient understood the risks, benefits, and alternatives and potential outcomes and wished to proceed.  PROCEDURE DETAILS: Patient was brought to the operating room placed under general endotracheal anesthesia. Patient was placed in the supine position on the operating room table. The neck was prepped with Duraprep and draped in a sterile fashion.   Three cc of local anesthesia was injected and a transverse incision was made on the right side of the neck.  Dissection was carried down thru the subcutaneous tissue and the platysma was  elevated, opened, and undermined with Metzenbaum scissors.  Dissection was then carried out thru an avascular plane leaving the sternocleidomastoid carotid artery and jugular vein laterally and the trachea and esophagus medially. The ventral aspect of the vertebral column was identified and a localizing x-ray was taken. The C4-5 and C5-6 levels were identified. The longus colli muscles were then elevated and the  retractor was placed to expose C4-5 C5-6 and C6-7. The annulus was incised and the disc space entered at each level. The exact same decompression was done at all 3 levels. Discectomy was performed with micro-curettes and pituitary rongeurs. I then used the high-speed drill to drill the endplates down to the level of the posterior longitudinal ligament. The drill shavings were saved in a mucous trap for later arthrodesis. The operating microscope was draped and brought into the field provided additional magnification, illumination and visualization. Discectomy was continued posteriorly thru the disc space. Posterior longitudinal ligament was opened with a nerve hook, and then removed along with disc herniation and osteophytes, decompressing the spinal canal and thecal sac. We then continued to remove osteophytic overgrowth and disc material decompressing the neural foramina and exiting nerve roots bilaterally. The scope was angled up and down to help decompress and undercut the vertebral bodies. Once the decompression was completed we could pass a nerve hook circumferentially to assure adequate decompression in the midline and in the neural foramina. So by both visualization and palpation we felt we had an adequate decompression of the neural elements at all 3 levels. We then measured the height of the intravertebral disc space and selected a 7 millimeter Peek interbody cage packed with autograft and morcellized allograft. It was then gently positioned in the C4-5 intravertebral disc space and countersunk. A 8 mm interbody cage was used at C5-6 and another 7 mm cage was used at C6-7. I then used a helix translational plate and placed fixed angle screws into the vertebral bodies of C4 C5 C6 and C7 and locked them into position. The wound was irrigated with bacitracin solution, checked for  hemostasis which was established and confirmed. Once meticulous hemostasis was achieved, we then proceeded with closure. A 7 flat  JP drain was placed. The platysma was closed with interrupted 3-0 undyed Vicryl suture, the subcuticular layer was closed with interrupted 3-0 undyed Vicryl suture. The skin edges were approximated with steristrips. The drapes were removed. A sterile dressing was applied. The patient was then awakened from general anesthesia and transferred to the recovery room in stable condition. At the end of the procedure all sponge, needle and instrument counts were correct.   PLAN OF CARE: Admit for overnight observation  PATIENT DISPOSITION:  PACU - hemodynamically stable.   Delay start of Pharmacological VTE agent (>24hrs) due to surgical blood loss or risk of bleeding:  yes

## 2013-08-16 ENCOUNTER — Encounter (HOSPITAL_COMMUNITY): Payer: Self-pay | Admitting: Neurological Surgery

## 2013-08-16 ENCOUNTER — Ambulatory Visit (HOSPITAL_COMMUNITY): Admission: RE | Admit: 2013-08-16 | Payer: 59 | Source: Ambulatory Visit | Admitting: Specialist

## 2013-08-16 ENCOUNTER — Encounter (HOSPITAL_COMMUNITY): Admission: RE | Payer: Self-pay | Source: Ambulatory Visit

## 2013-08-16 SURGERY — ANTERIOR CERVICAL DECOMPRESSION/DISCECTOMY FUSION 2 LEVELS
Anesthesia: General

## 2013-08-16 MED ORDER — CEFAZOLIN SODIUM 1-5 GM-% IV SOLN
1.0000 g | Freq: Once | INTRAVENOUS | Status: AC
  Administered 2013-08-16: 1 g via INTRAVENOUS
  Filled 2013-08-16: qty 50

## 2013-08-16 MED ORDER — OXYCODONE-ACETAMINOPHEN 5-325 MG PO TABS: 1.0000 | ORAL_TABLET | Freq: Four times a day (QID) | ORAL | Status: DC | PRN

## 2013-08-16 MED ORDER — TIZANIDINE HCL 4 MG PO TABS: 4.0000 mg | ORAL_TABLET | Freq: Four times a day (QID) | ORAL | Status: DC | PRN

## 2013-08-16 MED ORDER — ONDANSETRON HCL 4 MG PO TABS
4.0000 mg | ORAL_TABLET | ORAL | Status: DC | PRN
  Administered 2013-08-16: 4 mg via ORAL
  Filled 2013-08-16: qty 1

## 2013-08-16 NOTE — Discharge Summary (Signed)
Physician Discharge Summary  Patient ID: [name] MRN: [id number] DOB/AGE: Feb 09, 1975 38 y.o.  Admit date: 08/15/2013 Discharge date: 08/16/2013  Admission Diagnoses: cervical spondylosis    Discharge Diagnoses: same   Discharged Condition: good  Hospital Course: The patient was admitted on 08/15/2013 and taken to the operating room where the patient underwent ACDF. The patient tolerated the procedure well and was taken to the recovery room and then to the floor in stable condition. The hospital course was routine. There were no complications. The wound remained clean dry and intact. Pt had appropriate neck soreness. No complaints of arm pain or new N/T/W. The patient remained afebrile with stable vital signs, and tolerated a regular diet. The patient continued to increase activities, and pain was well controlled with oral pain medications.   Consults: None  Significant Diagnostic Studies:  Results for orders placed during the hospital encounter of 08/09/13  SURGICAL PCR SCREEN      Result Value Ref Range   MRSA, PCR NEGATIVE  NEGATIVE   Staphylococcus aureus NEGATIVE  NEGATIVE  BASIC METABOLIC PANEL      Result Value Ref Range   Sodium 140  137 - 147 mEq/L   Potassium 4.7  3.7 - 5.3 mEq/L   Chloride 97  96 - 112 mEq/L   CO2 29  19 - 32 mEq/L   Glucose, Bld 93  70 - 99 mg/dL   BUN 10  6 - 23 mg/dL   Creatinine, Ser 9.81  0.50 - 1.35 mg/dL   Calcium 9.9  8.4 - 19.1 mg/dL   GFR calc non Af Amer >90  >90 mL/min   GFR calc Af Amer >90  >90 mL/min   Anion gap 14  5 - 15  CBC WITH DIFFERENTIAL      Result Value Ref Range   WBC 7.6  4.0 - 10.5 K/uL   RBC 4.89  4.22 - 5.81 MIL/uL   Hemoglobin 15.0  13.0 - 17.0 g/dL   HCT 47.8  29.5 - 62.1 %   MCV 87.9  78.0 - 100.0 fL   MCH 30.7  26.0 - 34.0 pg   MCHC 34.9  30.0 - 36.0 g/dL   RDW 30.8  65.7 - 84.6 %   Platelets 307  150 - 400 K/uL   Neutrophils Relative % 65  43 - 77 %   Neutro Abs 4.9  1.7 - 7.7 K/uL   Lymphocytes  Relative 26  12 - 46 %   Lymphs Abs 2.0  0.7 - 4.0 K/uL   Monocytes Relative 7  3 - 12 %   Monocytes Absolute 0.5  0.1 - 1.0 K/uL   Eosinophils Relative 2  0 - 5 %   Eosinophils Absolute 0.1  0.0 - 0.7 K/uL   Basophils Relative 0  0 - 1 %   Basophils Absolute 0.0  0.0 - 0.1 K/uL  PROTIME-INR      Result Value Ref Range   Prothrombin Time 13.4  11.6 - 15.2 seconds   INR 1.02  0.00 - 1.49    Dg Cervical Spine 2-3 Views  08/15/2013   CLINICAL DATA:  Anterior cervical disc fusion.  EXAM: DG C-ARM 61-120 MIN; CERVICAL SPINE - 2-3 VIEW  COMPARISON:  MRI of July 29, 2013.  FINDINGS: Three intraoperative fluoroscopic images of the cervical spine were submitted for review. These images demonstrate the patient be status post surgical anterior fusion extending from C4-C7 with spacers in intervening disc spaces. Good alignment of the  vertebral bodies is noted.  IMPRESSION: Status post surgical anterior fusion of C4 through C7.   Electronically Signed   By: Roque Lias M.D.   On: 08/15/2013 14:50   Mr Cervical Spine Wo Contrast  07/29/2013   CLINICAL DATA:  Pain, neck stiffness.  EXAM: MRI CERVICAL SPINE WITHOUT CONTRAST  TECHNIQUE: Multiplanar, multisequence MR imaging of the cervical spine was performed. No intravenous contrast was administered.  COMPARISON:  Prior MRI from 04/10/2012  FINDINGS: The visualized portions of the brain and posterior fossa demonstrate a normal appearance with normal signal intensity. The craniocervical junction is widely patent.  Slight reversal of the normal cervical lordosis with apex at C5-6 is stable from prior exam. Degenerative endplate changes seen about the C5-6 intervertebral disc space. Otherwise, signal intensity within the vertebral body bone marrow is normal. Signal intensity within the visualized spinal cord is within normal limits. Mild prominence of the central canal at the level of C3 through C6-7, unchanged.  Visualized paraspinous soft tissues are within normal  limits.  C2-3:  Negative.  C3-4:  Negative.  C4-5: Diffuse degenerative disc bulging is present. There is a superimposed right paracentral disc protrusion now seen at this level, not definitely seen on prior exam (series 6, image 16). There is indentation of the right ventral thecal sac with mild flattening of the right hemi cord, also slightly increased. There is resultant mild canal and right foraminal narrowing. No significant left neural foraminal stenosis.  C5-6: Diffuse degenerative disc osteophyte, he centric to the left is present, similar to prior. Degenerative endplate changes noted at this level. Degenerative disc osteophyte indents the left ventral aspect of the thecal sac and flattens the left aspect of the spinal cord, stable from prior. No associated myelopathy. Moderate canal narrowing is stable. Mild bilateral foraminal narrowing also unchanged.  C6-7: There is diffuse degenerative disc osteophyte complex with intervertebral disc space narrowing and mild bilateral uncovertebral osteophytosis. Mild canal and bilateral foraminal narrowing not significantly changed.  C7-T1:  Negative.  No abnormality seen within the visualized upper thoracic spine.  IMPRESSION: 1. New right paracentral disc protrusion at C4-5 with resultant mild canal and right foraminal narrowing. 2. Stable degenerative disc disease at C5-6 and C6-7 with resultant canal and foraminal stenoses as above.   Electronically Signed   By: Rise Mu M.D.   On: 07/29/2013 22:41   Dg C-arm 1-60 Min  08/15/2013   CLINICAL DATA:  Anterior cervical disc fusion.  EXAM: DG C-ARM 61-120 MIN; CERVICAL SPINE - 2-3 VIEW  COMPARISON:  MRI of July 29, 2013.  FINDINGS: Three intraoperative fluoroscopic images of the cervical spine were submitted for review. These images demonstrate the patient be status post surgical anterior fusion extending from C4-C7 with spacers in intervening disc spaces. Good alignment of the vertebral bodies is noted.   IMPRESSION: Status post surgical anterior fusion of C4 through C7.   Electronically Signed   By: Roque Lias M.D.   On: 08/15/2013 14:50    Antibiotics:  Anti-infectives   Start     Dose/Rate Route Frequency Ordered Stop   08/16/13 0300  ceFAZolin (ANCEF) IVPB 1 g/50 mL premix     1 g 100 mL/hr over 30 Minutes Intravenous  Once 08/16/13 0234 08/16/13 0337   08/15/13 1730  ceFAZolin (ANCEF) IVPB 1 g/50 mL premix     1 g 100 mL/hr over 30 Minutes Intravenous Every 8 hours 08/15/13 1725 08/15/13 1940   08/15/13 1159  bacitracin 50,000 Units in sodium  chloride irrigation 0.9 % 500 mL irrigation  Status:  Discontinued       As needed 08/15/13 1201 08/15/13 1446   08/15/13 0600  ceFAZolin (ANCEF) IVPB 2 g/50 mL premix     2 g 100 mL/hr over 30 Minutes Intravenous On call to O.R. 08/14/13 1412 08/15/13 1131      Discharge Exam: Blood pressure 91/50, pulse 82, temperature 98 F (36.7 C), temperature source Oral, resp. rate 18, height 5\' 10"  (1.778 m), weight 90.266 kg (199 lb), SpO2 94.00%. Neurologic: Grossly normal Incision CDI  Discharge Medications:     Medication List    STOP taking these medications       meloxicam 7.5 MG tablet  Commonly known as:  MOBIC      TAKE these medications       DULoxetine 60 MG capsule  Commonly known as:  CYMBALTA  Take 60 mg by mouth daily.     eletriptan 40 MG tablet  Commonly known as:  RELPAX  One tablet by mouth at onset of headache. May repeat in 2 hours if headache persists or recurs. may repeat in 2 hours if necessary as needed for migraines     imipramine 50 MG tablet  Commonly known as:  TOFRANIL  Take 50 mg by mouth at bedtime.     lisinopril 20 MG tablet  Commonly known as:  PRINIVIL,ZESTRIL  Take 20 mg by mouth.     ondansetron 4 MG tablet  Commonly known as:  ZOFRAN  Take 1 tablet (4 mg total) by mouth every 8 (eight) hours as needed for nausea or vomiting.     oxyCODONE-acetaminophen 5-325 MG per tablet  Commonly  known as:  PERCOCET/ROXICET  Take 1-2 tablets by mouth every 6 (six) hours as needed for moderate pain or severe pain.     promethazine 25 MG tablet  Commonly known as:  PHENERGAN  Take 1 tablet (25 mg total) by mouth every 6 (six) hours as needed for nausea or vomiting.     propranolol 20 MG tablet  Commonly known as:  INDERAL  Take 40 mg by mouth 2 (two) times daily.     temazepam 15 MG capsule  Commonly known as:  RESTORIL  Take 15 mg by mouth at bedtime.     tiZANidine 4 MG tablet  Commonly known as:  ZANAFLEX  Take 1 tablet (4 mg total) by mouth every 6 (six) hours as needed. For muscle spasms     triamcinolone 0.025 % cream  Commonly known as:  KENALOG  Apply 1 application topically 2 (two) times daily as needed (rash).        Disposition: home   Final Dx: ACDF C4-5 C5-6 C6-7      Discharge Instructions   Call MD for:  difficulty breathing, headache or visual disturbances    Complete by:  As directed      Call MD for:  persistant nausea and vomiting    Complete by:  As directed      Call MD for:  redness, tenderness, or signs of infection (pain, swelling, redness, odor or green/yellow discharge around incision site)    Complete by:  As directed      Call MD for:  severe uncontrolled pain    Complete by:  As directed      Call MD for:  temperature >100.4    Complete by:  As directed      Diet - low sodium heart healthy    Complete  by:  As directed      Discharge instructions    Complete by:  As directed   No strenuous activity, no heavy lifting, no driving     Increase activity slowly    Complete by:  As directed            Follow-up Information   Follow up with Morrisa Aldaba S, MD In 3 weeks.   Specialty:  Neurosurgery   Contact information:   [address] [place] [address] [phone]        Signed: Tia Alert 08/16/2013, 1:00 PM

## 2013-08-17 NOTE — Progress Notes (Addendum)
Patient alert and oriented, mae's well, voiding adequate amount of urine, swallowing without difficulty, no c/o pain. Patient discharged home with family. Script and discharged instructions given to patient. Patient and family stated understanding of instructions given.  

## 2013-11-27 NOTE — Progress Notes (Signed)
   Subjective:    Patient ID: [name], male    DOB: [birth year], 38 y.o.   MRN: [id number]  HPI Patient cancelled appointment   Review of Systems     Objective:   Physical Exam        Assessment & Plan:

## 2013-12-21 ENCOUNTER — Ambulatory Visit (INDEPENDENT_AMBULATORY_CARE_PROVIDER_SITE_OTHER): Payer: 59 | Admitting: Family Medicine

## 2013-12-21 ENCOUNTER — Ambulatory Visit (INDEPENDENT_AMBULATORY_CARE_PROVIDER_SITE_OTHER): Payer: 59

## 2013-12-21 VITALS — BP 104/80 | HR 104 | Temp 97.9°F | Resp 18 | Ht 69.5 in | Wt 195.2 lb

## 2013-12-21 DIAGNOSIS — M5412 Radiculopathy, cervical region: Secondary | ICD-10-CM

## 2013-12-21 DIAGNOSIS — G43901 Migraine, unspecified, not intractable, with status migrainosus: Secondary | ICD-10-CM

## 2013-12-21 MED ORDER — PROMETHAZINE HCL 25 MG/ML IJ SOLN
25.0000 mg | Freq: Once | INTRAMUSCULAR | Status: AC
  Administered 2013-12-21: 25 mg via INTRAMUSCULAR

## 2013-12-21 MED ORDER — TIZANIDINE HCL 4 MG PO TABS: 4.0000 mg | ORAL_TABLET | Freq: Four times a day (QID) | ORAL | Status: DC | PRN

## 2013-12-21 MED ORDER — KETOROLAC TROMETHAMINE 60 MG/2ML IM SOLN
60.0000 mg | Freq: Once | INTRAMUSCULAR | Status: AC
  Administered 2013-12-21: 60 mg via INTRAMUSCULAR

## 2013-12-21 MED ORDER — ELETRIPTAN HYDROBROMIDE 40 MG PO TABS: 40.0000 mg | ORAL_TABLET | ORAL | Status: DC | PRN

## 2013-12-21 MED ORDER — PROMETHAZINE HCL 25 MG PO TABS: 25.0000 mg | ORAL_TABLET | Freq: Four times a day (QID) | ORAL | Status: DC | PRN

## 2013-12-21 NOTE — Progress Notes (Signed)
Subjective:  This chart was scribed for Delman Cheadle, MD by Dellis Filbert, ED Scribe at [facility].The patient was seen in exam room 01 and the patient's care was started at 3:52 PM.  Patient ID: [name], male    DOB: October 23, 1975, 38 y.o.   MRN: [id number] Chief Complaint  Patient presents with  . Migraine    last two says  . Neck Pain  . Emesis  . Dizziness   HPI HPI Comments: TOBI LEINWEBER is a 38 y.o. male who presents to [facility] complaining of a migraine, right neck spasams, emesis and dizziness onset two days ago. At the original onset he had bad left sided shoulder and neck pain along with some weakness so he had surgery to remedy the pain. He has had cervical fusion in July and states his migraines have improved but since Thursday he notes his migraine has worsened . Pt has taken Ibuprofen and Zofran which have provided no relief. He denies right hand weakness.  Patient Active Problem List   Diagnosis Date Noted  . S/P cervical spinal fusion 08/15/2013  . Migraine without aura 04/17/2012  . Cervicalgia 04/17/2012   Past Medical History  Diagnosis Date  . DDD (degenerative disc disease)   . Shingles   . Renal calculi   . Migraines     since 2002  . Hypertension   . IBS (irritable bowel syndrome)   . Anxiety   . Chronic pain   . Allergy   . Herniated cervical disc   . Chronic neck pain   . Sleep apnea     does not wear c-pap   Past Surgical History  Procedure Laterality Date  . Lithotripsy    . Wisdom tooth extraction    . Anterior cervical decomp/discectomy fusion N/A 08/15/2013    Procedure: Anterior Cervical Four-Five/Five-Six/Six-Seven Decompression and Fusion;  Surgeon: Eustace Moore, MD;  Location: [facility];  Service: Neurosurgery;  Laterality: N/A;   Allergies  Allergen Reactions  . Erythromycin Nausea And Vomiting   Prior to Admission medications   Medication Sig Start Date End Date Taking? Authorizing Provider  DULoxetine  (CYMBALTA) 60 MG capsule Take 60 mg by mouth daily.   Yes Historical Provider, MD  eletriptan (RELPAX) 40 MG tablet One tablet by mouth at onset of headache. May repeat in 2 hours if headache persists or recurs. may repeat in 2 hours if necessary as needed for migraines   Yes Historical Provider, MD  lisinopril (PRINIVIL,ZESTRIL) 20 MG tablet Take 20 mg by mouth.   Yes Historical Provider, MD  ondansetron (ZOFRAN) 4 MG tablet Take 1 tablet (4 mg total) by mouth every 8 (eight) hours as needed for nausea or vomiting. 04/21/13  Yes Leandrew Koyanagi, MD  propranolol (INDERAL) 20 MG tablet Take 40 mg by mouth 2 (two) times daily.    Yes Historical Provider, MD  temazepam (RESTORIL) 15 MG capsule Take 15 mg by mouth at bedtime.   Yes Historical Provider, MD  tiZANidine (ZANAFLEX) 4 MG tablet Take 1 tablet (4 mg total) by mouth every 6 (six) hours as needed. For muscle spasms 08/16/13  Yes Eustace Moore, MD  imipramine (TOFRANIL) 50 MG tablet Take 50 mg by mouth at bedtime.    Historical Provider, MD  oxyCODONE-acetaminophen (PERCOCET/ROXICET) 5-325 MG per tablet Take 1-2 tablets by mouth every 6 (six) hours as needed for moderate pain or severe pain. Patient not taking: Reported on 12/21/2013 08/16/13  Eustace Moore, MD  promethazine (PHENERGAN) 25 MG tablet Take 1 tablet (25 mg total) by mouth every 6 (six) hours as needed for nausea or vomiting. Patient not taking: Reported on 12/21/2013 07/17/13   Francine Graven, DO  triamcinolone (KENALOG) 0.025 % cream Apply 1 application topically 2 (two) times daily as needed (rash).    Historical Provider, MD   History   Social History  . Marital Status: Married    Spouse Name: Anderson Malta    Number of Children: 3  . Years of Education: N/A   Occupational History  .  [employer]   Social History Main Topics  . Smoking status: Never Smoker   . Smokeless tobacco: Never Used  . Alcohol Use: No     Comment: rarely  . Drug Use: No  . Sexual Activity: Not on  file   Other Topics Concern  . Not on file   Social History Narrative   Caffeine-Pt drinks 2 cups of caffeine daily.   Review of Systems  Gastrointestinal: Positive for vomiting.  Musculoskeletal: Positive for neck pain.  Neurological: Positive for dizziness and headaches.      Objective:  BP 104/80 mmHg  Pulse 104  Temp(Src) 97.9 F (36.6 C) (Oral)  Resp 18  Ht 5' 9.5" (1.765 m)  Wt 195 lb 3.2 oz (88.542 kg)  BMI 28.42 kg/m2  SpO2 97%  Physical Exam  Constitutional: He is oriented to person, place, and time. He appears well-developed and well-nourished.  HENT:  Head: Normocephalic and atraumatic.  Eyes: EOM are normal.  Neck: Normal range of motion.  Several reduced extension and lateral extension Good ROM and flexion.  Cardiovascular: Normal rate.   Pulmonary/Chest: Effort normal.  Musculoskeletal: Normal range of motion.  Neurological: He is alert and oriented to person, place, and time.  Reflex Scores:      Tricep reflexes are 2+ on the right side and 2+ on the left side.      Bicep reflexes are 2+ on the right side and 2+ on the left side.      Brachioradialis reflexes are 2+ on the right side and 2+ on the left side. 5/5 grasp strength, wrist flexion, lumbaricals.  Skin: Skin is warm and dry.  Psychiatric: He has a normal mood and affect. His behavior is normal.  Nursing note and vitals reviewed.   Primary cervical spine X-ray read by Dr. Brigitte Pulse. Prior fusion seen no acute changes or abnormalities.    Assessment & Plan:   Migraine with status migrainosus, not intractable, unspecified migraine type - Plan: promethazine (PHENERGAN) injection 25 mg, ketorolac (TORADOL) injection 60 mg  Cervical radiculitis - Plan: DG Cervical Spine 2 or 3 views  Meds ordered this encounter  Medications  . eletriptan (RELPAX) 40 MG tablet    Sig: Take 1 tablet (40 mg total) by mouth as needed for migraine. may repeat in 2 hours if necessary as needed for migraines     Dispense:  10 tablet    Refill:  1  . promethazine (PHENERGAN) injection 25 mg    Sig:   . ketorolac (TORADOL) injection 60 mg    Sig:   . promethazine (PHENERGAN) 25 MG tablet    Sig: Take 1 tablet (25 mg total) by mouth every 6 (six) hours as needed for nausea or vomiting (or migraine headaches).    Dispense:  30 tablet    Refill:  1  . tiZANidine (ZANAFLEX) 4 MG tablet    Sig: Take 1 tablet (  4 mg total) by mouth every 6 (six) hours as needed. For muscle spasms    Dispense:  60 tablet    Refill:  1    I personally performed the services described in this documentation, which was scribed in my presence. The recorded information has been reviewed and considered, and addended by me as needed.  Delman Cheadle, MD MPH

## 2014-01-30 ENCOUNTER — Ambulatory Visit (HOSPITAL_COMMUNITY)
Admission: RE | Admit: 2014-01-30 | Discharge: 2014-01-30 | Disposition: A | Discharge status: Home / Self Care | Payer: 59 | Source: Ambulatory Visit | Attending: Family Medicine | Admitting: Family Medicine

## 2014-01-30 ENCOUNTER — Ambulatory Visit (INDEPENDENT_AMBULATORY_CARE_PROVIDER_SITE_OTHER): Payer: 59 | Admitting: Family Medicine

## 2014-01-30 ENCOUNTER — Ambulatory Visit (INDEPENDENT_AMBULATORY_CARE_PROVIDER_SITE_OTHER): Payer: 59

## 2014-01-30 VITALS — BP 118/86 | HR 116 | Temp 98.6°F | Resp 20 | Wt 203.0 lb

## 2014-01-30 DIAGNOSIS — M79605 Pain in left leg: Secondary | ICD-10-CM | POA: Insufficient documentation

## 2014-01-30 DIAGNOSIS — M79672 Pain in left foot: Secondary | ICD-10-CM | POA: Insufficient documentation

## 2014-01-30 DIAGNOSIS — R200 Anesthesia of skin: Secondary | ICD-10-CM

## 2014-01-30 DIAGNOSIS — R208 Other disturbances of skin sensation: Secondary | ICD-10-CM

## 2014-01-30 DIAGNOSIS — M545 Low back pain, unspecified: Secondary | ICD-10-CM

## 2014-01-30 DIAGNOSIS — R32 Unspecified urinary incontinence: Secondary | ICD-10-CM | POA: Diagnosis not present

## 2014-01-30 HISTORY — DX: Low back pain, unspecified: M54.50

## 2014-01-30 LAB — POCT UA - MICROSCOPIC ONLY
Bacteria, U Microscopic: NEGATIVE
Casts, Ur, LPF, POC: NEGATIVE
Crystals, Ur, HPF, POC: NEGATIVE
Mucus, UA: POSITIVE
Yeast, UA: NEGATIVE

## 2014-01-30 LAB — POCT URINALYSIS DIPSTICK
Blood, UA: NEGATIVE
Glucose, UA: NEGATIVE
Leukocytes, UA: NEGATIVE
Nitrite, UA: NEGATIVE
Protein, UA: 30
Spec Grav, UA: >=1.03
Urobilinogen, UA: 0.2
pH, UA: 5.5

## 2014-01-30 MED ORDER — METHYLPREDNISOLONE 4 MG PO KIT: PACK | ORAL | Status: DC

## 2014-01-30 MED ORDER — OXYCODONE-ACETAMINOPHEN 5-325 MG PO TABS: 1.0000 | ORAL_TABLET | Freq: Four times a day (QID) | ORAL | Status: DC | PRN

## 2014-01-30 MED ORDER — DIAZEPAM 5 MG PO TABS: 5.0000 mg | ORAL_TABLET | Freq: Three times a day (TID) | ORAL | Status: DC | PRN

## 2014-01-30 NOTE — Progress Notes (Addendum)
[facility] [address]  [place] [address] [phone]  Date:  01/30/2014   Name:  [name]   DOB:  11-Mar-1975   MRN:  [id number]  PCP:  Wynelle Fanny    Chief Complaint: Back Pain   History of Present Illness:  [name] is a 39 y.o. very pleasant male patient who presents with the following:  Here today with low back pain and pain/ numbness down his left leg which has been increasing over the last 2-3 days. He works night shift; last night he got up from a chair and had sudden increase in his sx.  No known injury He has a history of LBP for several years, last MRI in 2014. No prior lumbar operations.   He had cervical spine surgery over the summer- had a fusion.  His neurosurgeon is Dr. Ronnald Ramp at [facility].    Looked him up in controlled substance registry- last rx for oxycodone was 10/11/13.  He also notes numbness of the left inner thigh, he has had urinary incontinence twice over the last 2 days.   He notes a sensation change in his genitals.  He has had painful stools (constipation) but no stool accidents  He drove here today but his wife is nearby and can pick him up.   He does also have a history of kidney stones.    Patient Active Problem List   Diagnosis Date Noted  . S/P cervical spinal fusion 08/15/2013  . Migraine without aura 04/17/2012  . Cervicalgia 04/17/2012    Past Medical History  Diagnosis Date  . DDD (degenerative disc disease)   . Shingles   . Renal calculi   . Migraines     since 2002  . Hypertension   . IBS (irritable bowel syndrome)   . Anxiety   . Chronic pain   . Allergy   . Herniated cervical disc   . Chronic neck pain   . Sleep apnea     does not wear c-pap    Past Surgical History  Procedure Laterality Date  . Lithotripsy    . Wisdom tooth extraction    . Anterior cervical decomp/discectomy fusion N/A 08/15/2013    Procedure: Anterior Cervical  Four-Five/Five-Six/Six-Seven Decompression and Fusion;  Surgeon: Eustace Moore, MD;  Location: [facility];  Service: Neurosurgery;  Laterality: N/A;    History  Substance Use Topics  . Smoking status: Never Smoker   . Smokeless tobacco: Never Used  . Alcohol Use: No     Comment: rarely    Family History  Problem Relation Age of Onset  . Headache Mother   . Prostate cancer Father   . Kidney cancer Father   . Kidney Stones Sister   . Migraines Sister     Allergies  Allergen Reactions  . Erythromycin Nausea And Vomiting    Medication list has been reviewed and updated.  Current Outpatient Prescriptions on File Prior to Visit  Medication Sig Dispense Refill  . DULoxetine (CYMBALTA) 60 MG capsule Take 60 mg by mouth daily.    Marland Kitchen eletriptan (RELPAX) 40 MG tablet Take 1 tablet (40 mg total) by mouth as needed for migraine. may repeat in 2 hours if necessary as needed for migraines 10 tablet 1  . lisinopril (PRINIVIL,ZESTRIL) 20 MG tablet Take 20 mg by mouth.    . ondansetron (ZOFRAN) 4 MG tablet Take 1 tablet (4 mg total) by mouth every 8 (eight) hours as  needed for nausea or vomiting. 12 tablet 0  . propranolol (INDERAL) 20 MG tablet Take 40 mg by mouth 2 (two) times daily.     Marland Kitchen tiZANidine (ZANAFLEX) 4 MG tablet Take 1 tablet (4 mg total) by mouth every 6 (six) hours as needed. For muscle spasms 60 tablet 1  . imipramine (TOFRANIL) 50 MG tablet Take 50 mg by mouth at bedtime.    . promethazine (PHENERGAN) 25 MG tablet Take 1 tablet (25 mg total) by mouth every 6 (six) hours as needed for nausea or vomiting (or migraine headaches). (Patient not taking: Reported on 01/30/2014) 30 tablet 1  . temazepam (RESTORIL) 15 MG capsule Take 15 mg by mouth at bedtime.    . triamcinolone (KENALOG) 0.025 % cream Apply 1 application topically 2 (two) times daily as needed (rash).     No current facility-administered medications on file prior to visit.    Review of Systems:  As per HPI-  otherwise negative.   Physical Examination: Filed Vitals:   01/30/14 0851  BP: 118/86  Pulse: 116  Temp: 98.6 F (37 C)  Resp: 20   Filed Vitals:   01/30/14 0851  Weight: 203 lb (92.08 kg)   Cannot calculate BMI with a height equal to zero. Ideal Body Weight:    GEN: WDWN, NAD, Non-toxic, A & O x 3, appears to be in pain HEENT: Atraumatic, Normocephalic. Neck supple. No masses, No LAD. Ears and Nose: No external deformity. CV: RRR, No M/G/R. No JVD. No thrill. No extra heart sounds. PULM: CTA B, no wheezes, crackles, rhonchi. No retractions. No resp. distress. No accessory muscle use. EXTR: No c/c/e NEURO moves slowly and stiffly PSYCH: Normally interactive. Conversant but is in pain Back: he notes pain in the left lower back superior to the buttock.  He is able to flex or extend his back just minimally.  He notes decreased sensation along the lateral left thigh and decreased sensation on the left groin. Rectal tone seems normal.  He has weakness of the left hamstring and quadriceps but difficult to tell if this is due to pain. Decreased left patellar reflex but he does not relax the quadriceps for testing.    [facility] reading (PRIMARY) by  Dr. Lorelei Pont. Lumbar spine: severe disc space loss at L5/S1, OW no acute findings  LUMBAR SPINE - COMPLETE 4+ VIEW  COMPARISON: Lumbar MRI of May 19, 2012.  FINDINGS: The spine level labeling is in accordance with the MRI of May of 2014. L5 is transitional. There is mild disc space narrowing at L4-5. There is mild disc space narrowing at other lumbar levels. The vertebral bodies are preserved in height. There is no spondylolisthesis. There is mild facet joint hypertrophy at L4-5 and L5-S1. The pedicles and transverse processes are intact.  IMPRESSION: There is no acute compression fracture. There is stable mild disc space narrowing at L4-5 with milder narrowing at other levels as well.  Results for orders placed or performed in visit  on 01/30/14  POCT urinalysis dipstick  Result Value Ref Range   Color, UA yellow    Clarity, UA clear    Glucose, UA negative    Bilirubin, UA small    Ketones, UA trace    Spec Grav, UA >=1.030    Blood, UA negative    pH, UA 5.5    Protein, UA 30    Urobilinogen, UA 0.2    Nitrite, UA negative    Leukocytes, UA Negative   POCT UA -  Microscopic Only  Result Value Ref Range   WBC, Ur, HPF, POC 3-6    RBC, urine, microscopic 0-3    Bacteria, U Microscopic negative    Mucus, UA positive    Epithelial cells, urine per micros 0-1    Crystals, Ur, HPF, POC negative    Casts, Ur, LPF, POC negative    Yeast, UA negative     Assessment and Plan: Low back pain radiating to left lower extremity - Plan: POCT urinalysis dipstick, POCT UA - Microscopic Only, Urine culture, DG Lumbar Spine Complete, oxyCODONE-acetaminophen (ROXICET) 5-325 MG per tablet, MR Lumbar Spine Wo Contrast  Numbness in left leg - Plan: POCT urinalysis dipstick, POCT UA - Microscopic Only, Urine culture, DG Lumbar Spine Complete, oxyCODONE-acetaminophen (ROXICET) 5-325 MG per tablet, MR Lumbar Spine Wo Contrast  Severe lower back pain with sensation change concerning for CES.  Send for an MRI now.  Rx for percocet that he can fill and take once he gets to [facility] as his wife plans to pick him up after his scan  Signed Lamar Blinks, MD  Received MRI MRI LUMBAR SPINE WITHOUT CONTRAST  TECHNIQUE: Multiplanar, multisequence MR imaging of the lumbar spine was performed. No intravenous contrast was administered.  COMPARISON: Plain films lumbar spine this same day and MRI lumbar spine 05/19/2012.  FINDINGS: Transitional anatomy is again seen at the lumbosacral junction. Numbering scheme is the same as that used on the prior study with the transitional segment labeled L5.  Small Schmorl's nodes are noted in the superior endplate of N05, unchanged. There is no fracture or listhesis. Mild convex left scoliosis is  noted and unchanged. The conus medullaris is normal in signal and position. Imaged intra-abdominal contents are unremarkable.  The T11-12 and T12-L1 levels are imaged in the sagittal plane only. The central canal and foramina are widely patent at each level with a minimal disc bulge at T11-12 unchanged.  L1-2: Negative.  L2-3: No change in a minimal disc bulge without central canal or foraminal narrowing.  L3-4: No change in a very shallow disc bulge without central canal or foraminal narrowing.  L4-5: There has been mild progression of disease at this level with a small right paracentral protrusion identified which has increased in size. The disc indents the ventral thecal sac but the central canal, lateral recesses and foramina are all widely patent.  L5-S1: Negative.  IMPRESSION: Transitional lumbosacral anatomy. Numbering scheme is the same as that used on the prior study.  Mild progression of disease at L4-5 where a shallow right paracentral protrusion has increased in size but there is no nerve root encroachment.  Minimal disc bulging L2-3 and L3-4 without central canal or foraminal narrowing is unchanged.  Was able to call and discuss with Dr. Ronnald Ramp.  We will treat [place] with a medrol dosepack and valium as needed for spasm and pain. He is instructed to use this OR zanaflex, not both.  He can continue to use percocet as well.   Johnny will call the [place] office and schedule an appt soon, he will seek care if getting worse or if any change in his sx.  Called to check on him around 8pm.  He is doing ok but still having pain.  Cautioned to watch for excessive sedation with his medications and he will do so, he will contact us if any other concerns.   Meds ordered this encounter  Medications  . oxyCODONE-acetaminophen (ROXICET) 5-325 MG per tablet    Sig: Take 1-2  tablets by mouth every 6 (six) hours as needed for severe pain.    Dispense:  40 tablet    Refill:  0  .  methylPREDNISolone (MEDROL DOSEPAK) 4 MG tablet    Sig: follow package directions    Dispense:  21 tablet    Refill:  0  . diazepam (VALIUM) 5 MG tablet    Sig: Take 1 tablet (5 mg total) by mouth every 8 (eight) hours as needed for anxiety. Or for muscle spasm    Dispense:  30 tablet    Refill:  0     Called 1/25 to check on him and LMOM.  I will send him a letter regarding his labs also- he did have slight microhematuria with a negative culture so should consider recheck vs further work- up.

## 2014-01-30 NOTE — Patient Instructions (Addendum)
[facility] for your MRI.    10:45 AM arrival- go right away You can take some pain medication if needed I will call you and have Dr. Ronnald Ramp look at your report.   I am sorry that you are having such a hard time!

## 2014-01-31 ENCOUNTER — Telehealth: Payer: Self-pay | Admitting: Family Medicine

## 2014-01-31 LAB — URINE CULTURE
Colony Count: NO GROWTH
Organism ID, Bacteria: NO GROWTH

## 2014-01-31 NOTE — Telephone Encounter (Signed)
Called him this am to check on him.  He reported continued pain and difficulty with ADLs.  Encouraged him to contact his [place] office; perhaps they can see him today or otherwise advise him as to the next step.  He agreed to do so

## 2014-02-11 ENCOUNTER — Encounter: Payer: Self-pay | Admitting: Family Medicine

## 2014-02-16 ENCOUNTER — Telehealth: Payer: Self-pay | Admitting: Radiology

## 2014-02-16 DIAGNOSIS — R3129 Other microscopic hematuria: Secondary | ICD-10-CM

## 2014-02-16 NOTE — Telephone Encounter (Signed)
P 

## 2014-02-18 NOTE — Telephone Encounter (Signed)
Called him back- I am sorry that I had not called him back, I did not get his message(s).  Discussed microhematuria with him- he would like to come in for a repeat UA and micro at his convenience. See letter dated 1/26 for content of our discussion. I will place this order for him He has [facility] seen [facility] for stones but his old doctor has retired.

## 2014-02-18 NOTE — Telephone Encounter (Signed)
Dr. Lorelei Pont please call pt.

## 2014-02-18 NOTE — Telephone Encounter (Signed)
Received the following message from pt via our website Patient needs to speak to Dr. Lorelei Pont urgently about lab results and current condition. States he has left several messages and has not heard back yet. Also, his primary Carlos Levering at [facility] has not received those notes yet.  No phone # left by patient

## 2014-02-20 ENCOUNTER — Other Ambulatory Visit (INDEPENDENT_AMBULATORY_CARE_PROVIDER_SITE_OTHER): Payer: 59

## 2014-02-20 ENCOUNTER — Telehealth: Payer: Self-pay | Admitting: Family Medicine

## 2014-02-20 DIAGNOSIS — R3129 Other microscopic hematuria: Secondary | ICD-10-CM

## 2014-02-20 DIAGNOSIS — R312 Other microscopic hematuria: Secondary | ICD-10-CM

## 2014-02-20 LAB — POCT URINALYSIS DIPSTICK
Bilirubin, UA: NEGATIVE
Blood, UA: NEGATIVE
Glucose, UA: NEGATIVE
Leukocytes, UA: NEGATIVE
Nitrite, UA: NEGATIVE
Protein, UA: NEGATIVE
Spec Grav, UA: 1.025
Urobilinogen, UA: 0.2
pH, UA: 6

## 2014-02-20 LAB — POCT UA - MICROSCOPIC ONLY
Bacteria, U Microscopic: NEGATIVE
Casts, Ur, LPF, POC: NEGATIVE
Crystals, Ur, HPF, POC: NEGATIVE
Mucus, UA: NEGATIVE
Yeast, UA: NEGATIVE

## 2014-02-20 NOTE — Telephone Encounter (Signed)
Patient called and stated

## 2014-02-20 NOTE — Telephone Encounter (Signed)
Labs printed ready for pick up (upon completion of ROI signed form)

## 2014-02-20 NOTE — Progress Notes (Signed)
Faxed labs to [facility] at [phone]

## 2014-02-20 NOTE — Telephone Encounter (Signed)
Patient called and states that he spoke to Dr. Lorelei Pont 2 days ago about picking up a lab kit to do a sample. Patient also wants to pick his lab results from his last OV so he can take them to his specialist appointment. I let patient know that he can get his lab results printed today and he will need to complete a ROI form at the front desk.  [phone]

## 2014-04-30 ENCOUNTER — Ambulatory Visit (INDEPENDENT_AMBULATORY_CARE_PROVIDER_SITE_OTHER): Payer: 59 | Admitting: Emergency Medicine

## 2014-04-30 ENCOUNTER — Emergency Department (HOSPITAL_COMMUNITY)
Admission: EM | Admit: 2014-04-30 | Discharge: 2014-04-30 | Disposition: A | Discharge status: Home / Self Care | Payer: 59 | Attending: Emergency Medicine | Admitting: Emergency Medicine

## 2014-04-30 ENCOUNTER — Encounter (HOSPITAL_COMMUNITY): Payer: Self-pay | Admitting: Emergency Medicine

## 2014-04-30 VITALS — BP 128/84 | HR 97 | Temp 98.3°F | Resp 16 | Ht 70.0 in | Wt 203.0 lb

## 2014-04-30 DIAGNOSIS — F419 Anxiety disorder, unspecified: Secondary | ICD-10-CM | POA: Insufficient documentation

## 2014-04-30 DIAGNOSIS — Z79899 Other long term (current) drug therapy: Secondary | ICD-10-CM | POA: Diagnosis not present

## 2014-04-30 DIAGNOSIS — G43909 Migraine, unspecified, not intractable, without status migrainosus: Secondary | ICD-10-CM | POA: Diagnosis not present

## 2014-04-30 DIAGNOSIS — I10 Essential (primary) hypertension: Secondary | ICD-10-CM | POA: Diagnosis not present

## 2014-04-30 DIAGNOSIS — G43709 Chronic migraine without aura, not intractable, without status migrainosus: Secondary | ICD-10-CM

## 2014-04-30 DIAGNOSIS — G43009 Migraine without aura, not intractable, without status migrainosus: Secondary | ICD-10-CM

## 2014-04-30 DIAGNOSIS — G8929 Other chronic pain: Secondary | ICD-10-CM | POA: Insufficient documentation

## 2014-04-30 DIAGNOSIS — R110 Nausea: Secondary | ICD-10-CM

## 2014-04-30 DIAGNOSIS — Z8669 Personal history of other diseases of the nervous system and sense organs: Secondary | ICD-10-CM | POA: Diagnosis not present

## 2014-04-30 DIAGNOSIS — Z8619 Personal history of other infectious and parasitic diseases: Secondary | ICD-10-CM | POA: Diagnosis not present

## 2014-04-30 DIAGNOSIS — Z8739 Personal history of other diseases of the musculoskeletal system and connective tissue: Secondary | ICD-10-CM | POA: Insufficient documentation

## 2014-04-30 DIAGNOSIS — Z87442 Personal history of urinary calculi: Secondary | ICD-10-CM | POA: Insufficient documentation

## 2014-04-30 DIAGNOSIS — Z8719 Personal history of other diseases of the digestive system: Secondary | ICD-10-CM | POA: Diagnosis not present

## 2014-04-30 MED ORDER — METHYLPREDNISOLONE SODIUM SUCC 125 MG IJ SOLR
125.0000 mg | Freq: Once | INTRAMUSCULAR | Status: AC
  Administered 2014-04-30: 125 mg via INTRAVENOUS
  Filled 2014-04-30: qty 2

## 2014-04-30 MED ORDER — SODIUM CHLORIDE 0.9 % IV BOLUS (SEPSIS)
1000.0000 mL | Freq: Once | INTRAVENOUS | Status: AC
  Administered 2014-04-30: 1000 mL via INTRAVENOUS

## 2014-04-30 MED ORDER — KETOROLAC TROMETHAMINE 60 MG/2ML IM SOLN
60.0000 mg | Freq: Once | INTRAMUSCULAR | Status: AC
  Administered 2014-04-30: 60 mg via INTRAMUSCULAR

## 2014-04-30 MED ORDER — HYDROCODONE-ACETAMINOPHEN 5-325 MG PO TABS: 1.0000 | ORAL_TABLET | Freq: Four times a day (QID) | ORAL | Status: DC | PRN

## 2014-04-30 MED ORDER — ONDANSETRON 4 MG PO TBDP
8.0000 mg | ORAL_TABLET | Freq: Once | ORAL | Status: AC
  Administered 2014-04-30: 8 mg via ORAL

## 2014-04-30 MED ORDER — DIPHENHYDRAMINE HCL 50 MG/ML IJ SOLN
25.0000 mg | Freq: Once | INTRAMUSCULAR | Status: AC
  Administered 2014-04-30: 25 mg via INTRAVENOUS
  Filled 2014-04-30: qty 1

## 2014-04-30 MED ORDER — PROCHLORPERAZINE EDISYLATE 5 MG/ML IJ SOLN
10.0000 mg | Freq: Once | INTRAMUSCULAR | Status: AC
  Administered 2014-04-30: 10 mg via INTRAVENOUS
  Filled 2014-04-30: qty 2

## 2014-04-30 MED ORDER — MAGNESIUM SULFATE 2 GM/50ML IV SOLN
2.0000 g | Freq: Once | INTRAVENOUS | Status: AC
  Administered 2014-04-30: 2 g via INTRAVENOUS
  Filled 2014-04-30: qty 50

## 2014-04-30 MED ORDER — BUTALBITAL-APAP-CAFFEINE 50-325-40 MG PO TABS: 1.0000 | ORAL_TABLET | Freq: Four times a day (QID) | ORAL | Status: DC | PRN

## 2014-04-30 NOTE — Progress Notes (Addendum)
Subjective:  This chart was scribed for [name], by [name], at [facility].  This patient was seen in room 14 and the patient's care was started at 9:57 AM.    Patient ID: [name], male    DOB: Jul 28, 1975, 39 y.o.   MRN: [id number]  HPI  HPI Comments: [name] is a 39 y.o. male who presents to [facility] for a constant severe migraine, blurred vision, nausea and diziness onset two days ago.   Patient notes he has a history of migraines and is seen by Dr. Percell Miller at [place].  Patient notes his migraines are triggered by certain foods, the weather and allergies.  Patient took Relpax two nights ago, Zofran (last night) as well as Advil and Cymbalta.  Patient took Lisinopril, Cymbalta and Allegra this morning.  Patient has received Toradol shots in the past and states that they have helped him.  Patient had a neck fusion last year July 30th. Patient has no other complaints today.     Patient Active Problem List   Diagnosis Date Noted  . 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 S/P cervical spinal fusion 08/15/2013  . Migraine without aura 04/17/2012  . Cervicalgia 04/17/2012   Past Medical History  Diagnosis Date  . DDD (degenerative disc disease)   . Shingles   . Renal calculi   . Migraines     since 2002  . Hypertension   . IBS (irritable bowel syndrome)   . Anxiety   . Chronic pain   . Allergy   . Herniated cervical disc   . Chronic neck pain   . Sleep apnea     does not wear c-pap   Past Surgical History  Procedure Laterality Date  . Lithotripsy    . Wisdom tooth extraction    . Anterior cervical decomp/discectomy fusion N/A 08/15/2013    Procedure: Anterior Cervical Four-Five/Five-Six/Six-Seven Decompression and Fusion;  Surgeon: Eustace Moore, [name];  Location: [facility];  Service: Neurosurgery;  Laterality: N/A;   Allergies  Allergen Reactions  . Erythromycin Nausea And Vomiting    Prior to Admission medications   Medication Sig Start Date End Date Taking? Authorizing Provider  DULoxetine (CYMBALTA) 60 MG capsule Take 60 mg by mouth daily.   Yes Historical Provider, [name]  eletriptan (RELPAX) 40 MG tablet Take 1 tablet (40 mg total) by mouth as needed for migraine. may repeat in 2 hours if necessary as needed for migraines 12/21/13  Yes Shawnee Knapp, [name]  lisinopril (PRINIVIL,ZESTRIL) 20 MG tablet Take 20 mg by mouth.   Yes Historical Provider, [name]  ondansetron (ZOFRAN) 4 MG tablet Take 1 tablet (4 mg total) by mouth every 8 (eight) hours as needed for nausea or vomiting. 04/21/13  Yes Leandrew Koyanagi, [name]  propranolol (INDERAL) 20 MG tablet Take 40 mg by mouth 2 (two) times daily.    Yes Historical Provider, [name]  tiZANidine (ZANAFLEX) 4 MG tablet Take 1 tablet (4 mg total) by mouth every 6 (six) hours as needed. For muscle spasms 12/21/13  Yes Shawnee Knapp, [name]   History   Social History  . Marital Status: Married    Spouse Name: Anderson Malta  . Number of Children: 3  . Years of Education: N/A   Occupational History  .  [employer]   Social History Main Topics  . Smoking status: Never Smoker   . Smokeless tobacco: Never Used  .  Alcohol Use: No     Comment: rarely  . Drug Use: No  . Sexual Activity: Not on file   Other Topics Concern  . Not on file   Social History Narrative   Caffeine-Pt drinks 2 cups of caffeine daily.         Review of Systems  Constitutional: Negative for fever and chills.  HENT: Negative for congestion, dental problem, drooling and nosebleeds.   Eyes: Positive for visual disturbance. Negative for discharge.  Gastrointestinal: Positive for nausea.  Neurological: Positive for dizziness and headaches. Negative for syncope.       Objective:   Physical Exam  CONSTITUTIONAL:  Patient is lying in fetal position complaining of severe headache and nausea HEAD: Normocephalic/atraumatic EYES: Pupils are small but reactive.  ENMT: Mucous  membranes moist NECK: Neck is supple and no nuchal rigidity . SPINE/BACK:entire spine nontender CV: S1/S2 noted, no murmurs/rubs/gallops noted LUNGS: Lungs are clear to auscultation bilaterally, no apparent distress ABDOMEN: soft, nontender, no rebound or guarding, bowel sounds noted throughout abdomen GU:no cva tenderness NEURO: Cranial nerves are normal EXTREMITIES:There is no motor weakness upper and lower extremities SKIN: warm, color normal PSYCH: no abnormalities of mood noted, alert and oriented to situation  ER called. Have advised the patient to go to the emergency room consider their cocktail for migraines. In our office he received 60 of Toradol and 8 of Zofran. He continues to have a 5 out of 6 headache I discussed the situation with his wife. I was able to ambulate him in the hall but he continues to have a 5 this is a     Vitals:   04/30/14 0937  BP: 128/84  Pulse: 97  Temp: 98.3 F (36.8 C)  TempSrc: Oral  Resp: 16  Height: 5\' 10"  (1.778 m)  Weight: 203 lb (92.08 kg)  SpO2: 97%   Recheck at 1145. Patient persisted having a 6-7 out of 10 right periorbital headache heat. He has not vomited blood pressure stable 110/80. He is here with his wife. He is going to sit up for the next few minutes see how he feels and if able we'll see if we can transport by private vehicle for further treatment.      Assessment & Plan:  I was able to ambulate pt in the hall. He continues to have a 5 to 6 out of 10 headache. I have referred him to [facility] ER. I have spoke with charge nurse there. I personally performed the services described in this documentation, which was scribed in my presence. The recorded information has been reviewed and is accurate. Patient was given a prescription for #10 Norco 5 mg

## 2014-04-30 NOTE — Discharge Instructions (Signed)
Please follow with your primary care doctor in the next 2 days for a check-up. They must obtain records for further management.   Do not hesitate to return to the Emergency Department for any new, worsening or concerning symptoms.    Migraine Headache A migraine headache is an intense, throbbing pain on one or both sides of your head. A migraine can last for 30 minutes to several hours. CAUSES  The exact cause of a migraine headache is not always known. However, a migraine may be caused when nerves in the brain become irritated and release chemicals that cause inflammation. This causes pain. Certain things may also trigger migraines, such as:  Alcohol.  Smoking.  Stress.  Menstruation.  Aged cheeses.  Foods or drinks that contain nitrates, glutamate, aspartame, or tyramine.  Lack of sleep.  Chocolate.  Caffeine.  Hunger.  Physical exertion.  Fatigue.  Medicines used to treat chest pain (nitroglycerine), birth control pills, estrogen, and some blood pressure medicines. SIGNS AND SYMPTOMS  Pain on one or both sides of your head.  Pulsating or throbbing pain.  Severe pain that prevents daily activities.  Pain that is aggravated by any physical activity.  Nausea, vomiting, or both.  Dizziness.  Pain with exposure to bright lights, loud noises, or activity.  General sensitivity to bright lights, loud noises, or smells. Before you get a migraine, you may get warning signs that a migraine is coming (aura). An aura may include:  Seeing flashing lights.  Seeing bright spots, halos, or zigzag lines.  Having tunnel vision or blurred vision.  Having feelings of numbness or tingling.  Having trouble talking.  Having muscle weakness. DIAGNOSIS  A migraine headache is often diagnosed based on:  Symptoms.  Physical exam.  A CT scan or MRI of your head. These imaging tests cannot diagnose migraines, but they can help rule out other causes of  headaches. TREATMENT Medicines may be given for pain and nausea. Medicines can also be given to help prevent recurrent migraines.  HOME CARE INSTRUCTIONS  Only take over-the-counter or prescription medicines for pain or discomfort as directed by your health care provider. The use of long-term narcotics is not recommended.  Lie down in a dark, quiet room when you have a migraine.  Keep a journal to find out what may trigger your migraine headaches. For example, write down:  What you eat and drink.  How much sleep you get.  Any change to your diet or medicines.  Limit alcohol consumption.  Quit smoking if you smoke.  Get 7-9 hours of sleep, or as recommended by your health care provider.  Limit stress.  Keep lights dim if bright lights bother you and make your migraines worse. SEEK IMMEDIATE MEDICAL CARE IF:   Your migraine becomes severe.  You have a fever.  You have a stiff neck.  You have vision loss.  You have muscular weakness or loss of muscle control.  You start losing your balance or have trouble walking.  You feel faint or pass out.  You have severe symptoms that are different from your first symptoms. MAKE SURE YOU:   Understand these instructions.  Will watch your condition.  Will get help right away if you are not doing well or get worse. Document Released: 01/03/2005 Document Revised: 05/20/2013 Document Reviewed: 09/10/2012 [facility] Patient Information 2015 [place], [place]. This information is not intended to replace advice given to you by your health care provider. Make sure you discuss any questions you have with your  health care provider. ° °

## 2014-04-30 NOTE — ED Provider Notes (Signed)
CSN: [id number]     Arrival date & time 04/30/14  1223 History   First MD Initiated Contact with Patient 04/30/14 1251     Chief Complaint  Patient presents with  . Migraine     (Consider location/radiation/quality/duration/timing/severity/associated sxs/prior Treatment) HPI   [name] is a 39 y.o. male complaining of a severe migraine for 3 days.  He states the migraine is on the right side of his head, down around the right ear, and to the right side of the neck.  This is a typical migraine for him.  He also has photophobia, phonophobia, nausea, and dizziness.  He went to urgent care this morning because the headache had gone on several days without relief, and they sent him to the ED after attempting an injection of toradol without improvement.  He normally takes Relpax and Zofran for his migraines.  He took 1 Relpax when this migraine began with some relief, but failed to take a second pill to completely make it go away.  He says that this is not the worst headache he has ever had.  He denies chest pain, shortness of breath, vomiting, or weakness.     Past Medical History  Diagnosis Date  . DDD (degenerative disc disease)   . Shingles   . Renal calculi   . Migraines     since 2002  . Hypertension   . IBS (irritable bowel syndrome)   . Anxiety   . Chronic pain   . Allergy   . Herniated cervical disc   . Chronic neck pain   . Sleep apnea     does not wear c-pap   Past Surgical History  Procedure Laterality Date  . Lithotripsy    . Wisdom tooth extraction    . Anterior cervical decomp/discectomy fusion N/A 08/15/2013    Procedure: Anterior Cervical Four-Five/Five-Six/Six-Seven Decompression and Fusion;  Surgeon: Eustace Moore, MD;  Location: [place] [facility];  Service: Neurosurgery;  Laterality: N/A;   Family History  Problem Relation Age of Onset  . Headache Mother   . Prostate cancer Father   . Kidney cancer Father   . Kidney Stones Sister   . Migraines Sister     History  Substance Use Topics  . Smoking status: Never Smoker   . Smokeless tobacco: Never Used  . Alcohol Use: No     Comment: rarely    Review of Systems  10 systems reviewed and found to be negative, except as noted in the HPI.   Allergies  Erythromycin  Home Medications   Prior to Admission medications   Medication Sig Start Date End Date Taking? Authorizing Provider  clonazePAM (KLONOPIN) 0.5 MG tablet Take 0.5 mg by mouth 2 (two) times daily as needed for anxiety.   Yes Historical Provider, MD  DULoxetine (CYMBALTA) 60 MG capsule Take 60 mg by mouth daily.   Yes Historical Provider, MD  eletriptan (RELPAX) 40 MG tablet Take 1 tablet (40 mg total) by mouth as needed for migraine. may repeat in 2 hours if necessary as needed for migraines 12/21/13  Yes Shawnee Knapp, MD  fexofenadine (ALLEGRA) 180 MG tablet Take 180 mg by mouth daily.   Yes Historical Provider, MD  ibuprofen (ADVIL,MOTRIN) 200 MG tablet Take 200-400 mg by mouth every 6 (six) hours as needed for headache, mild pain or moderate pain.   Yes Historical Provider, MD  lisinopril (PRINIVIL,ZESTRIL) 20 MG tablet Take 20 mg by mouth.   Yes Historical Provider, MD  ondansetron (ZOFRAN) 4 MG tablet Take 1 tablet (4 mg total) by mouth every 8 (eight) hours as needed for nausea or vomiting. 04/21/13  Yes Leandrew Koyanagi, MD  propranolol (INDERAL) 20 MG tablet Take 20 mg by mouth 2 (two) times daily.    Yes Historical Provider, MD  tiZANidine (ZANAFLEX) 4 MG tablet Take 1 tablet (4 mg total) by mouth every 6 (six) hours as needed. For muscle spasms 12/21/13  Yes Shawnee Knapp, MD  butalbital-acetaminophen-caffeine [facility]) [phone] MG per tablet Take 1 tablet by mouth every 6 (six) hours as needed for headache. 04/30/14   Elmyra Ricks Shahmeer Bunn, PA-C  HYDROcodone-acetaminophen (NORCO) 5-325 MG per tablet Take 1 tablet by mouth every 6 (six) hours as needed. 04/30/14   Darlyne Russian, MD   BP 139/92 mmHg  Pulse 87  Temp(Src) 98 F  (36.7 C) (Oral)  Resp 14  Ht 5\' 10"  (1.778 m)  Wt 205 lb (92.987 kg)  BMI 29.41 kg/m2  SpO2 97% Physical Exam  Constitutional: He is oriented to person, place, and time. He appears well-developed and well-nourished. No distress.  HENT:  Head: Normocephalic and atraumatic.  Mouth/Throat: Oropharynx is clear and moist.  Eyes: Conjunctivae and EOM are normal. Pupils are equal, round, and reactive to light.  Neck: Normal range of motion.  Cardiovascular: Normal rate, regular rhythm and intact distal pulses.   Pulmonary/Chest: Effort normal and breath sounds normal.  Abdominal: Soft. There is no tenderness.  Musculoskeletal: Normal range of motion.  Neurological: He is alert and oriented to person, place, and time.  Follows commands, Clear, goal oriented speech, Strength is 5 out of 5x4 extremities, patient ambulates with a coordinated in nonantalgic gait. Sensation is grossly intact.   Skin: He is not diaphoretic.  Psychiatric: He has a normal mood and affect.  Nursing note and vitals reviewed.   ED Course  Procedures (including critical care time) Labs Review Labs Reviewed - No data to display  Imaging Review No results found.   EKG Interpretation None      MDM   Final diagnoses:  Migraine without aura and without status migrainosus, not intractable   Filed Vitals:   04/30/14 1239 04/30/14 1423 04/30/14 1430  BP: 134/94 139/89 139/92  Pulse: 97 91 87  Temp: 98 F (36.7 C)    TempSrc: Oral    Resp: 16 18 14   Height: 5\' 10"  (1.778 m)    Weight: 205 lb (92.987 kg)    SpO2: 98% 97% 97%    Medications  sodium chloride 0.9 % bolus 1,000 mL (0 mLs Intravenous Stopped 04/30/14 1429)  prochlorperazine (COMPAZINE) injection 10 mg (10 mg Intravenous Given 04/30/14 1403)  methylPREDNISolone sodium succinate (SOLU-MEDROL) 125 mg/2 mL injection 125 mg (125 mg Intravenous Given 04/30/14 1403)  magnesium sulfate IVPB 2 g 50 mL (0 g Intravenous Stopped 04/30/14 1429)   diphenhydrAMINE (BENADRYL) injection 25 mg (25 mg Intravenous Given 04/30/14 1403)    [name] is a pleasant 39 y.o. male presenting with migraine exacerbation, typical for prior. Neuro exam is nonfocal. No concern for meningitis, subarachnoid. He'll and reports significant improvement to pain at 4 out of 10. He has received a call from his wife that his father is in need of assistance, father recently had a stroke.  Evaluation does not show pathology that would require ongoing emergent intervention or inpatient treatment. Pt is hemodynamically stable and mentating appropriately. Discussed findings and plan with patient/guardian, who agrees with care plan. All questions answered. Return  precautions discussed and outpatient follow up given.   New Prescriptions   BUTALBITAL-ACETAMINOPHEN-CAFFEINE (FIORICET) 50-325-40 MG PER TABLET    Take 1 tablet by mouth every 6 (six) hours as needed for headache.       Monico Blitz, PA-C 04/30/14 [place], PA-C 04/30/14 1438  Milton Ferguson, MD 04/30/14 1531

## 2014-04-30 NOTE — ED Notes (Signed)
Pt requesting to leave because "something is going on with my dad and I need to get to him." Pt stating that his HA is 4/10 now and nausea is much better, 3/10. Elmyra Ricks, PA at bedside. Verbal order to d/c bolus and magnesium infusion.

## 2014-04-30 NOTE — ED Notes (Signed)
Pt presents with migraine x 3 days, severe in nature, pt states he feels like normal migraine only on right side instead of left. +nausea, +photo and phonosensitivity. Pt states he is sensitive to smells as well as dizzy. Pt states he was seen at [facility] today given Toradol 60mg  IM with no relief.

## 2014-05-07 ENCOUNTER — Ambulatory Visit: Payer: 59 | Admitting: Diagnostic Neuroimaging

## 2014-06-01 ENCOUNTER — Ambulatory Visit (INDEPENDENT_AMBULATORY_CARE_PROVIDER_SITE_OTHER): Payer: 59 | Admitting: Emergency Medicine

## 2014-06-01 VITALS — BP 120/85 | HR 53 | Temp 97.9°F | Resp 18 | Wt 203.0 lb

## 2014-06-01 DIAGNOSIS — T7840XA Allergy, unspecified, initial encounter: Secondary | ICD-10-CM | POA: Diagnosis not present

## 2014-06-01 DIAGNOSIS — L247 Irritant contact dermatitis due to plants, except food: Secondary | ICD-10-CM

## 2014-06-01 DIAGNOSIS — T783XXA Angioneurotic edema, initial encounter: Secondary | ICD-10-CM | POA: Diagnosis not present

## 2014-06-01 MED ORDER — METHYLPREDNISOLONE SODIUM SUCC 125 MG IJ SOLR: 125.0000 mg | Freq: Once | INTRAMUSCULAR | Status: DC

## 2014-06-01 MED ORDER — PREDNISONE 10 MG (48) PO TBPK: ORAL_TABLET | Freq: Every day | ORAL | Status: DC

## 2014-06-01 NOTE — Addendum Note (Signed)
Addended by: Roselee Culver on: 06/01/2014 04:11 PM   Modules accepted: Orders

## 2014-06-01 NOTE — Patient Instructions (Signed)
Angioedema °Angioedema is a sudden swelling of tissues, often of the skin. It can occur on the face or genitals or in the abdomen or other body parts. The swelling usually develops over a short period and gets better in 24 to 48 hours. It often begins during the night and is found when the person wakes up. The person may also get red, itchy patches of skin (hives). Angioedema can be dangerous if it involves swelling of the air passages.  °Depending on the cause, episodes of angioedema may only happen once, come back in unpredictable patterns, or repeat for several years and then gradually fade away.  °CAUSES  °Angioedema can be caused by an allergic reaction to various triggers. It can also result from nonallergic causes, including reactions to drugs, immune system disorders, viral infections, or an abnormal gene that is passed to you from your parents (hereditary). For some people with angioedema, the cause is unknown.  °Some things that can trigger angioedema include:  °· Foods.   °· Medicines, such as ACE inhibitors, ARBs, nonsteroidal anti-inflammatory agents, or estrogen.   °· Latex.   °· Animal saliva.   °· Insect stings.   °· Dyes used in X-rays.   °· Mild injury.   °· Dental work. °· Surgery. °· Stress.   °· Sudden changes in temperature.   °· Exercise. °SIGNS AND SYMPTOMS  °· Swelling of the skin. °· Hives. If these are present, there is also intense itching. °· Redness in the affected area.   °· Pain in the affected area. °· Swollen lips or tongue. °· Breathing problems. This may happen if the air passages swell. °· Wheezing. °If internal organs are involved, there may be:  °· Nausea.   °· Abdominal pain.   °· Vomiting.   °· Difficulty swallowing.   °· Difficulty passing urine. °DIAGNOSIS  °· Your health care provider will examine the affected area and take a medical and family history. °· Various tests may be done to help determine the cause. Tests may include: °¨ Allergy skin tests to see if the problem  is an allergic reaction.   °¨ Blood tests to check for hereditary angioedema.   °¨ Tests to check for underlying diseases that could cause the condition.   °· A review of your medicines, including over-the-counter medicines, may be done. °TREATMENT  °Treatment will depend on the cause of the angioedema. Possible treatments include:  °· Removal of anything that triggered the condition (such as stopping certain medicines).   °· Medicines to treat symptoms or prevent attacks. Medicines given may include:   °¨ Antihistamines.   °¨ Epinephrine injection.   °¨ Steroids.   °· Hospitalization may be required for severe attacks. If the air passages are affected, it can be an emergency. Tubes may need to be placed to keep the airway open. °HOME CARE INSTRUCTIONS  °· Take all medicines as directed by your health care provider. °· If you were given medicines for emergency allergy treatment, always carry them with you. °· Wear a medical bracelet as directed by your health care provider.   °· Avoid known triggers. °SEEK MEDICAL CARE IF:  °· You have repeat attacks of angioedema.   °· Your attacks are more frequent or more severe despite preventive measures.   °· You have hereditary angioedema and are considering having children. It is important to discuss with your health care provider the risks of passing the condition on to your children. °SEEK IMMEDIATE MEDICAL CARE IF:  °· You have severe swelling of the mouth, tongue, or lips. °· You have difficulty breathing.   °· You have difficulty swallowing.   °· You faint. °MAKE   SURE YOU: °· Understand these instructions. °· Will watch your condition. °· Will get help right away if you are not doing well or get worse. °Document Released: 03/14/2001 Document Revised: 05/20/2013 Document Reviewed: 08/27/2012 °[employer]® Patient Information ©2015 [employer]. This information is not intended to replace advice given to you by your health care provider. Make sure you discuss any questions  you have with your health care provider. ° °

## 2014-06-01 NOTE — Progress Notes (Addendum)
[facility] [address], [place] [address] [phone]  Date:  06/01/2014   Name:  [name]   DOB:  08-29-1975   MRN:  [id number]  PCP:  Wynelle Fanny    Chief Complaint: Allergic Reaction   History of Present Illness:  [name] is a 39 y.o. very pleasant male patient who presents with the following:  Awoke with sensation of swelling and pruritis right cheek Developed a sense of hoarseness that progressed to a moderate shortness of breath No wheezing but had some subjective shortness of breath Took 50 benadryl at home with some improvement of symptoms Feels weak and fatigued No history of new allergen exposure or known bite. No new medication or personal care product. Takes lisinopril No improvement with over the counter medications or other home remedies.  Denies other complaint or health concern today.   Patient Active Problem List   Diagnosis Date Noted  . 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 S/P cervical spinal fusion 08/15/2013  . Migraine without aura 04/17/2012  . Cervicalgia 04/17/2012    Past Medical History  Diagnosis Date  . DDD (degenerative disc disease)   . Shingles   . Renal calculi   . Migraines     since 2002  . Hypertension   . IBS (irritable bowel syndrome)   . Anxiety   . Chronic pain   . Allergy   . Herniated cervical disc   . Chronic neck pain   . Sleep apnea     does not wear c-pap    Past Surgical History  Procedure Laterality Date  . Lithotripsy    . Wisdom tooth extraction    . Anterior cervical decomp/discectomy fusion N/A 08/15/2013    Procedure: Anterior Cervical Four-Five/Five-Six/Six-Seven Decompression and Fusion;  Surgeon: Eustace Moore, MD;  Location: [facility];  Service: Neurosurgery;  Laterality: N/A;    History  Substance Use Topics  . Smoking status: Never Smoker   . Smokeless tobacco: Never Used  . Alcohol Use: No     Comment: rarely     Family History  Problem Relation Age of Onset  . Headache Mother   . Prostate cancer Father   . Kidney cancer Father   . Kidney Stones Sister   . Migraines Sister     Allergies  Allergen Reactions  . Erythromycin Nausea And Vomiting    Medication list has been reviewed and updated.  Current Outpatient Prescriptions on File Prior to Visit  Medication Sig Dispense Refill  . clonazePAM (KLONOPIN) 0.5 MG tablet Take 0.5 mg by mouth 2 (two) times daily as needed for anxiety.    . DULoxetine (CYMBALTA) 60 MG capsule Take 60 mg by mouth daily.    Marland Kitchen eletriptan (RELPAX) 40 MG tablet Take 1 tablet (40 mg total) by mouth as needed for migraine. may repeat in 2 hours if necessary as needed for migraines 10 tablet 1  . fexofenadine (ALLEGRA) 180 MG tablet Take 180 mg by mouth daily.    Marland Kitchen lisinopril (PRINIVIL,ZESTRIL) 20 MG tablet Take 20 mg by mouth.    . ondansetron (ZOFRAN) 4 MG tablet Take 1 tablet (4 mg total) by mouth every 8 (eight) hours as needed for nausea or vomiting. 12 tablet 0  . tiZANidine (ZANAFLEX) 4 MG tablet Take 1 tablet (4 mg total) by mouth every 6 (six) hours as needed. For muscle spasms 60 tablet 1  . butalbital-acetaminophen-caffeine (FIORICET) 50-325-40 MG per  tablet Take 1 tablet by mouth every 6 (six) hours as needed for headache. (Patient not taking: Reported on 06/01/2014) 20 tablet 0  . HYDROcodone-acetaminophen (NORCO) 5-325 MG per tablet Take 1 tablet by mouth every 6 (six) hours as needed. (Patient not taking: Reported on 06/01/2014) 10 tablet 0  . propranolol (INDERAL) 20 MG tablet Take 20 mg by mouth 2 (two) times daily.      No current facility-administered medications on file prior to visit.    Review of Systems:  Review of Systems  Constitutional: Negative for fever, chills and fatigue.  HENT: Negative for congestion, ear pain, hearing loss, postnasal drip, rhinorrhea and sinus pressure.   Eyes: Negative for discharge and redness.  Respiratory:  Negative for cough, shortness of breath and wheezing.   Cardiovascular: Negative for chest pain and leg swelling.  Gastrointestinal: Negative for nausea, vomiting, abdominal pain, constipation and blood in stool.  Genitourinary: Negative for dysuria, urgency and frequency.  Musculoskeletal: Negative for neck stiffness.  Skin: Negative for rash.  Neurological: Negative for seizures, weakness and headaches.   Physical Examination: Filed Vitals:   06/01/14 1409  BP: 143/91  Pulse: 53  Temp: 97.9 F (36.6 C)  Resp: 18   Filed Vitals:   06/01/14 1409  Weight: 203 lb (92.08 kg)   Body mass index is 29.13 kg/(m^2). Ideal Body Weight:    GEN: WDWN, moderate distress, Non-toxic, A & O x 3 HEENT: Atraumatic, Normocephalic. Neck supple. No masses, No LAD. Ears and Nose: No external deformity. CV: RRR, No M/G/R. No JVD. No thrill. No extra heart sounds. PULM: CTA B, no wheezes, crackles, rhonchi. No retractions. No resp. distress. No accessory muscle use. ABD: S, NT, ND, +BS. No rebound. No HSM. EXTR: No c/c/e NEURO Normal gait.  PSYCH: Normally interactive. Conversant. Not depressed or anxious appearing.  Calm demeanor.  SKIN;  Red and swollen right   Assessment and Plan: 1. Allergic reaction, initial encounter Must exclude angioedema so will send him to the er - methylPREDNISolone sodium succinate (SOLU-MEDROL) 125 mg/2 mL injection 125 mg; Inject 2 mLs (125 mg total) into the vein once. Refuses EMS will go by pov.  2.  Poison ivy TAC  Signed Ellison Carwin, MD   Refused transfer to ER will go home.   Appropriate red flags and actions were discussed with the patient who understands. Prescribed benadryl and sterapred Understands he may die as a consequence of this condition.

## 2015-02-09 MED FILL — DULoxetine HCL 60 MG CPEP: 60 | 90 days supply | Qty: 180 | Fill #2

## 2015-02-17 DIAGNOSIS — H524 Presbyopia: Secondary | ICD-10-CM | POA: Diagnosis not present

## 2015-02-17 DIAGNOSIS — H5213 Myopia, bilateral: Secondary | ICD-10-CM | POA: Diagnosis not present

## 2015-02-17 DIAGNOSIS — H52223 Regular astigmatism, bilateral: Secondary | ICD-10-CM | POA: Diagnosis not present

## 2015-02-20 MED FILL — PROPRANOLOL 20 MG TABLET: 20 | 90 days supply | Qty: 90 | Fill #1

## 2015-02-20 MED FILL — LISINOPRIL 20 MG TABLET: 20 | 90 days supply | Qty: 90 | Fill #1

## 2015-02-20 MED FILL — RELPAX 40 MG TABLET: 40 | 30 days supply | Qty: 9 | Fill #7

## 2015-05-29 MED FILL — RELPAX 40 MG TABLET: 40 | 30 days supply | Qty: 9 | Fill #0

## 2015-06-12 ENCOUNTER — Encounter: Payer: Self-pay | Admitting: Family Medicine

## 2015-06-12 ENCOUNTER — Ambulatory Visit (INDEPENDENT_AMBULATORY_CARE_PROVIDER_SITE_OTHER): Payer: 59 | Admitting: Family Medicine

## 2015-06-12 VITALS — BP 118/80 | Temp 98.5°F | Ht 70.0 in | Wt 207.1 lb

## 2015-06-12 DIAGNOSIS — L02214 Cutaneous abscess of groin: Secondary | ICD-10-CM

## 2015-06-12 DIAGNOSIS — R309 Painful micturition, unspecified: Secondary | ICD-10-CM | POA: Diagnosis not present

## 2015-06-12 DIAGNOSIS — N23 Unspecified renal colic: Secondary | ICD-10-CM

## 2015-06-12 LAB — POCT URINALYSIS DIPSTICK
Blood, UA: NEGATIVE
Leukocytes, UA: NEGATIVE
Spec Grav, UA: <=1.005
pH, UA: 7

## 2015-06-12 MED ORDER — OXYCODONE-ACETAMINOPHEN 10-325 MG PO TABS: 1.0000 | ORAL_TABLET | ORAL | Status: DC | PRN

## 2015-06-12 MED ORDER — ONDANSETRON 8 MG PO TBDP: 8.0000 mg | ORAL_TABLET | Freq: Three times a day (TID) | ORAL | Status: DC | PRN

## 2015-06-12 MED ORDER — DOXYCYCLINE HYCLATE 100 MG PO CAPS: 100.0000 mg | ORAL_CAPSULE | Freq: Two times a day (BID) | ORAL | Status: DC

## 2015-06-12 NOTE — Progress Notes (Signed)
   Subjective:    Patient ID: [name], male    DOB: October 16, 1975, 40 y.o.   MRN: [id number]  Abdominal Pain This is a new problem. The current episode started in the past 7 days. The pain is located in the RLQ. The pain is at a severity of 8/10. The pain is severe. The abdominal pain radiates to the left flank and right flank. Associated symptoms include nausea and vomiting. Associated symptoms comments: Painful urination, Back spasms. Treatments tried: ibuprofen, heat.   Patient in today wit groin pain, abdominal pain, lower back spasms, painful urination.  Patient states pain on the right side radicular/renal colic symptomatology into the right groin denies high fever chills sweats Hx of kidney stones.   States no other concerns this visit.  Results for orders placed or performed in visit on 06/12/15  POCT urinalysis dipstick  Result Value Ref Range   Color, UA Yellow    Clarity, UA Clear    Glucose, UA     Bilirubin, UA     Ketones, UA     Spec Grav, UA <=1.005    Blood, UA Negative    pH, UA 7.0    Protein, UA     Urobilinogen, UA     Nitrite, UA     Leukocytes, UA Negative Negative    Review of Systems  Gastrointestinal: Positive for nausea, vomiting and abdominal pain.       Objective:   Physical Exam Lungs are clear no crackles respiratory rate is normal heart is regular no murmurs abdomen is soft with right flank moderate tenderness it radiates into the groin he also has a small abscess in the groin region in the base near the penis is not severe.       Assessment & Plan:  Probable kidney stone recommend KUB recommended the pain gets worse to immediately go to ER pain medication nausea medicine as needed  Small abscess noted in the right groin this was drained under sterile conditions with a #11 blade numbing it with 1% lidocaine, verbal consent received from the patient, pus obtained, dressing placed, warm compresses frequently, if worsening or failure to  improve to notify us Doxycycline twice a day for 7-10 days

## 2015-06-13 MED ORDER — TAMSULOSIN HCL 0.4 MG PO CAPS: ORAL_CAPSULE | ORAL | Status: DC

## 2015-07-13 MED FILL — PROPRANOLOL 20 MG TABLET: 20 | 30 days supply | Qty: 30 | Fill #0

## 2015-07-13 MED FILL — LISINOPRIL 20 MG TABLET: 20 | 30 days supply | Qty: 30 | Fill #0

## 2015-07-13 MED FILL — DULoxetine HCL 60 MG CPEP: 60 | 30 days supply | Qty: 60 | Fill #0

## 2015-07-22 MED FILL — RELPAX 40 MG TABLET: 40 | 30 days supply | Qty: 9 | Fill #1

## 2015-07-30 DIAGNOSIS — D225 Melanocytic nevi of trunk: Secondary | ICD-10-CM | POA: Diagnosis not present

## 2015-07-30 DIAGNOSIS — L820 Inflamed seborrheic keratosis: Secondary | ICD-10-CM | POA: Diagnosis not present

## 2015-07-30 DIAGNOSIS — L918 Other hypertrophic disorders of the skin: Secondary | ICD-10-CM | POA: Diagnosis not present

## 2015-08-24 ENCOUNTER — Encounter: Payer: Self-pay | Admitting: Family Medicine

## 2015-08-24 ENCOUNTER — Ambulatory Visit (INDEPENDENT_AMBULATORY_CARE_PROVIDER_SITE_OTHER): Payer: 59 | Admitting: Family Medicine

## 2015-08-24 VITALS — Ht 70.0 in | Wt 203.0 lb

## 2015-08-24 DIAGNOSIS — R112 Nausea with vomiting, unspecified: Secondary | ICD-10-CM

## 2015-08-24 DIAGNOSIS — N23 Unspecified renal colic: Secondary | ICD-10-CM

## 2015-08-24 DIAGNOSIS — G43009 Migraine without aura, not intractable, without status migrainosus: Secondary | ICD-10-CM

## 2015-08-24 MED ORDER — ONDANSETRON 8 MG PO TBDP: 8.0000 mg | ORAL_TABLET | Freq: Three times a day (TID) | ORAL | 3 refills | Status: DC | PRN

## 2015-08-24 MED ORDER — PROMETHAZINE HCL 25 MG PO TABS: 25.0000 mg | ORAL_TABLET | Freq: Three times a day (TID) | ORAL | 0 refills | Status: DC | PRN

## 2015-08-24 MED ORDER — TAMSULOSIN HCL 0.4 MG PO CAPS: ORAL_CAPSULE | ORAL | 1 refills | Status: DC

## 2015-08-24 MED ORDER — OXYCODONE-ACETAMINOPHEN 10-325 MG PO TABS: 1.0000 | ORAL_TABLET | ORAL | 0 refills | Status: DC | PRN

## 2015-08-24 MED ORDER — PROMETHAZINE HCL 25 MG/ML IJ SOLN
25.0000 mg | Freq: Once | INTRAMUSCULAR | Status: AC
  Administered 2015-08-24: 25 mg via INTRAMUSCULAR

## 2015-08-24 NOTE — Progress Notes (Signed)
   Subjective:    Patient ID: [name], male    DOB: 29-Apr-1975, 40 y.o.   MRN: [id number]  HPI  Patient arrives with c/o severe migraine since Sat-Patient has a hx of migraines in the past.Patient states severe migraine over the past 3 days he has tried over-the-counter pain medicines as well as his migraine medicine he states the pain is been severe  He also relates symptoms consistent with a kidney stone left flank pain discomfort slight radiation into the groin has had lithotripsy in the past Patient also having groin and flank pain and feels like he has a kidney stone also.  Review of Systems  Constitutional: Positive for fatigue. Negative for diaphoresis and fever.  HENT: Negative for congestion.   Respiratory: Negative for cough.   Cardiovascular: Negative for chest pain.  Gastrointestinal: Positive for abdominal pain, nausea and vomiting. Negative for diarrhea.  Genitourinary: Positive for flank pain.  Neurological: Positive for headaches.       Objective:   Physical Exam  Constitutional: He appears well-developed.  HENT:  Head: Normocephalic.  Mouth/Throat: Oropharynx is clear and moist. No oropharyngeal exudate.  Neck: Normal range of motion.  Cardiovascular: Normal rate, regular rhythm and normal heart sounds.   No murmur heard. Pulmonary/Chest: Effort normal and breath sounds normal. He has no wheezes.  Abdominal: Soft. There is tenderness (left flank).  Lymphadenopathy:    He has no cervical adenopathy.  Neurological: He is alert. He exhibits normal muscle tone. Coordination normal.  Skin: Skin is warm and dry.  Psychiatric: His behavior is normal.  Nursing note and vitals reviewed. Patient does not have severe flank tenderness abdomen is soft  Patient does not look toxic but he does not look like he feels good he does complain of headache flank pain nausea vomiting  Phenergan shot given because of vomiting-family member drove him    Assessment & Plan:    Migraine-Phenergan shot given for nausea and vomiting no driving today no driving with Phenergan or oxycodone pain medication as needed rest today and tomorrow if not getting better over the next 2-3 days follow-up here or ER  Flank pain possibility of kidney stone Flomax as directed pain medication when necessary cautioned drowsiness if not improving over next 24 hours go to ER for further evaluation of kidney stone

## 2015-08-31 MED FILL — ELETRIPTAN HBR 40 MG TABLET: 40 | 30 days supply | Qty: 9 | Fill #2

## 2015-09-08 ENCOUNTER — Telehealth: Payer: Self-pay | Admitting: *Deleted

## 2015-09-08 NOTE — Telephone Encounter (Signed)
Patient stated [name] has left [place] and he switched it to us-Patient states he has been taking it for a year or 2 for anxiety and migraines

## 2015-09-08 NOTE — Telephone Encounter (Signed)
Fax from [facility] outpt pharm. Requesting refill on duloxetine hcl dr 60mg  one bid. Originally prescribed by Carlos Levering in [place]. Last seen 8/7

## 2015-09-08 NOTE — Telephone Encounter (Signed)
?  reason pt takes. ?does he want [place] to take over?

## 2015-09-09 NOTE — Telephone Encounter (Signed)
60 days worth , o v with me necessary for [place] to asume and maintain long term

## 2015-09-10 MED ORDER — DULOXETINE HCL 60 MG PO CPEP: 60.0000 mg | ORAL_CAPSULE | Freq: Every day | ORAL | 1 refills | Status: DC

## 2015-09-10 NOTE — Telephone Encounter (Signed)
Spoke with patient and informed him per Dr.Steve Luking- we are sending in 60 days worth of medication but an office visit will be necessary for [place] to assume and maintain long term medication. Patient verbalized understanding. Medication sent into pharmacy.

## 2015-09-22 DIAGNOSIS — Z029 Encounter for administrative examinations, unspecified: Secondary | ICD-10-CM

## 2015-09-22 MED FILL — LISINOPRIL 20 MG TABLET: 20 | 14 days supply | Qty: 14 | Fill #0

## 2015-09-22 MED FILL — PROPRANOLOL 20 MG TABLET: 20 | 14 days supply | Qty: 14 | Fill #0

## 2015-10-02 ENCOUNTER — Ambulatory Visit (INDEPENDENT_AMBULATORY_CARE_PROVIDER_SITE_OTHER): Payer: 59 | Admitting: Family Medicine

## 2015-10-02 ENCOUNTER — Encounter: Payer: Self-pay | Admitting: Family Medicine

## 2015-10-02 VITALS — BP 122/92 | Temp 98.7°F | Ht 70.0 in | Wt 205.4 lb

## 2015-10-02 DIAGNOSIS — R079 Chest pain, unspecified: Secondary | ICD-10-CM | POA: Diagnosis not present

## 2015-10-02 DIAGNOSIS — R112 Nausea with vomiting, unspecified: Secondary | ICD-10-CM

## 2015-10-02 NOTE — Progress Notes (Signed)
   Subjective:    Patient ID: [name], male    DOB: 01-31-1975, 40 y.o.   MRN: [id number]  Emesis   This is a new problem. The current episode started today. The problem occurs 5 to 10 times per day. The problem has been gradually improving. The emesis has an appearance of stomach contents. Associated symptoms include abdominal pain, chest pain, coughing and headaches. Associated symptoms comments: Sore throat. Treatments tried: Mucinex D.   Headaches virtually every other day  Taking relpax and nausea med, missed couple day work sith severe nausea  Pt has freq nausea   Not a lot of prob with migraine  Pt does have chronic nausea trouble  Used cymbalta to help for [employer] and stress and anxiety, pt states helps some   Feels full sens in throat lie somethin needs to come ou   Patient expressing chest pain. Worried about this. Achy. Sharp in nature. Has had several episodes of substantial vomiting today.  Patient states also has concerns of migraines. Review of Systems  Respiratory: Positive for cough.   Cardiovascular: Positive for chest pain.  Gastrointestinal: Positive for abdominal pain and vomiting.  Neurological: Positive for headaches.       Objective:   Physical Exam  Alert anxious appearing no major distress. Lungs clear. Heart regular rhythm chest wall nontender abdomen no rebound good bowel sounds no discrete tenderness  EKG normal sinus rhythm no significant ST-T changes    Assessment & Plan:  Impression 1 chest pain. Noncardiac discuss likely chest wall #2 substantial vomiting. Potential gastroenteritis. Patient also has a chronic aspect was nausea. #3 chronic anxiety discussed for hypertension plan Phenergan tab when necessary. Symptom care discussed. Expect slow improvement. Maintain Cymbalta. Diet exercise discussed. Also has element of chronic migraines and has had some success with Inderal

## 2015-10-06 ENCOUNTER — Encounter: Payer: Self-pay | Admitting: Family Medicine

## 2015-10-12 MED FILL — ELETRIPTAN HBR 40 MG TABLET: 40 | 30 days supply | Qty: 9 | Fill #3

## 2015-10-23 ENCOUNTER — Telehealth: Payer: Self-pay | Admitting: Family Medicine

## 2015-10-23 NOTE — Telephone Encounter (Signed)
Patient left a voice message requesting Rx for lisinopril and propanolol.  He said he uses these to control his migraines.  They were previously prescribed by Carlos Levering at [facility] [place], but he said that Dr. Richardson Landry said he would prescribe these for him.  He only has 1 pill left and would like it called in today if possible.  [facility]

## 2015-10-23 NOTE — Telephone Encounter (Signed)
Tried to call no answer. Voicemail full.  to let pt know dr Richardson Landry would get message on Monday.

## 2015-10-23 NOTE — Telephone Encounter (Signed)
,   Please send a message to Dr. Richardson Landry so he can decide if wanting to give further refills

## 2015-10-26 MED ORDER — LISINOPRIL 20 MG PO TABS: 20.0000 mg | ORAL_TABLET | Freq: Every day | ORAL | 5 refills | Status: DC

## 2015-10-26 MED ORDER — PROPRANOLOL HCL 20 MG PO TABS: 20.0000 mg | ORAL_TABLET | Freq: Two times a day (BID) | ORAL | 5 refills | Status: DC

## 2015-10-26 NOTE — Telephone Encounter (Signed)
Notified patient that meds were sent to pharmacy.  

## 2015-10-26 NOTE — Telephone Encounter (Signed)
Six months for both

## 2015-11-10 MED FILL — ELETRIPTAN HBR 40 MG TABLET: 40 | 30 days supply | Qty: 9 | Fill #4

## 2015-11-16 ENCOUNTER — Ambulatory Visit: Payer: 59 | Admitting: Family Medicine

## 2015-11-19 ENCOUNTER — Encounter: Payer: Self-pay | Admitting: Family Medicine

## 2015-11-19 ENCOUNTER — Other Ambulatory Visit: Payer: Self-pay

## 2015-11-19 ENCOUNTER — Ambulatory Visit (INDEPENDENT_AMBULATORY_CARE_PROVIDER_SITE_OTHER): Payer: 59 | Admitting: Family Medicine

## 2015-11-19 VITALS — BP 114/78 | Ht 70.0 in | Wt 206.4 lb

## 2015-11-19 DIAGNOSIS — L819 Disorder of pigmentation, unspecified: Secondary | ICD-10-CM | POA: Diagnosis not present

## 2015-11-19 DIAGNOSIS — D225 Melanocytic nevi of trunk: Secondary | ICD-10-CM | POA: Diagnosis not present

## 2015-11-19 MED ORDER — AMOXICILLIN-POT CLAVULANATE 875-125 MG PO TABS: 1.0000 | ORAL_TABLET | Freq: Two times a day (BID) | ORAL | 0 refills | Status: DC

## 2015-11-19 MED FILL — AMOX-CLAV 875-125 MG TABLET: 875-125 | 7 days supply | Qty: 14 | Fill #0

## 2015-11-19 NOTE — Progress Notes (Signed)
   Subjective:    Patient ID: [name], male    DOB: 1975/05/19, 40 y.o.   MRN: [id number]  HPI Patient is here today for a skin tag/mole that is causing him a lot of discomfort. Located on the waistline. Patient notes mole located left posterior flank. At belt line. Progressive irritation. Some growth. Occasional bleeding. Occasional itching. Worsening over past 6 months. Now tender and sore the last couple weeks. Next   Treatments tried: none  Patient states that he has a possibly infected toenail also.    Review of Systems No headache, no major weight loss or weight gain, no chest pain no back pain abdominal pain no change in bowel habits complete ROS otherwise negative     Objective:   Physical Exam Alert vitals stable, NAD. Blood pressure good on repeat. HEENT normal. Lungs clear. Heart regular rate and rhythm. Irritated inflamed crusty hyperpigmented mole left posterior flank next  Patient was prepped draped anesthetized punch biopsy utilize remove it with 5 mm biopsy. Repaired with 2 sutures wound care discussed follow-up for suture removal 1 week antibiotics prescribed       Assessment & Plan:

## 2015-11-23 ENCOUNTER — Emergency Department (HOSPITAL_COMMUNITY)
Admission: EM | Admit: 2015-11-23 | Discharge: 2015-11-23 | Disposition: A | Discharge status: Home / Self Care | Payer: PRIVATE HEALTH INSURANCE | Attending: Emergency Medicine | Admitting: Emergency Medicine

## 2015-11-23 ENCOUNTER — Emergency Department (HOSPITAL_COMMUNITY): Payer: PRIVATE HEALTH INSURANCE

## 2015-11-23 ENCOUNTER — Encounter (HOSPITAL_COMMUNITY): Payer: Self-pay | Admitting: Emergency Medicine

## 2015-11-23 DIAGNOSIS — Z79899 Other long term (current) drug therapy: Secondary | ICD-10-CM | POA: Diagnosis not present

## 2015-11-23 DIAGNOSIS — Y999 Unspecified external cause status: Secondary | ICD-10-CM | POA: Diagnosis not present

## 2015-11-23 DIAGNOSIS — X501XXA Overexertion from prolonged static or awkward postures, initial encounter: Secondary | ICD-10-CM | POA: Insufficient documentation

## 2015-11-23 DIAGNOSIS — Y9389 Activity, other specified: Secondary | ICD-10-CM | POA: Diagnosis not present

## 2015-11-23 DIAGNOSIS — I10 Essential (primary) hypertension: Secondary | ICD-10-CM | POA: Insufficient documentation

## 2015-11-23 DIAGNOSIS — Y929 Unspecified place or not applicable: Secondary | ICD-10-CM | POA: Diagnosis not present

## 2015-11-23 DIAGNOSIS — S3992XA Unspecified injury of lower back, initial encounter: Secondary | ICD-10-CM | POA: Diagnosis present

## 2015-11-23 DIAGNOSIS — M62830 Muscle spasm of back: Secondary | ICD-10-CM

## 2015-11-23 DIAGNOSIS — S39012A Strain of muscle, fascia and tendon of lower back, initial encounter: Secondary | ICD-10-CM | POA: Diagnosis not present

## 2015-11-23 MED ORDER — DIAZEPAM 5 MG PO TABS: 5.0000 mg | ORAL_TABLET | Freq: Four times a day (QID) | ORAL | 0 refills | Status: DC | PRN

## 2015-11-23 MED ORDER — HYDROMORPHONE HCL 1 MG/ML IJ SOLN
1.0000 mg | Freq: Once | INTRAMUSCULAR | Status: AC
  Administered 2015-11-23: 1 mg via INTRAVENOUS
  Filled 2015-11-23: qty 1

## 2015-11-23 MED ORDER — PREDNISONE 20 MG PO TABS: ORAL_TABLET | ORAL | 0 refills | Status: DC

## 2015-11-23 MED ORDER — DEXAMETHASONE SODIUM PHOSPHATE 10 MG/ML IJ SOLN
10.0000 mg | Freq: Once | INTRAMUSCULAR | Status: AC
  Administered 2015-11-23: 10 mg via INTRAVENOUS
  Filled 2015-11-23: qty 1

## 2015-11-23 MED ORDER — DIAZEPAM 5 MG/ML IJ SOLN
5.0000 mg | Freq: Once | INTRAMUSCULAR | Status: AC
  Administered 2015-11-23: 5 mg via INTRAVENOUS
  Filled 2015-11-23: qty 2

## 2015-11-23 MED ORDER — KETOROLAC TROMETHAMINE 30 MG/ML IJ SOLN
30.0000 mg | Freq: Once | INTRAMUSCULAR | Status: AC
  Administered 2015-11-23: 30 mg via INTRAVENOUS
  Filled 2015-11-23: qty 1

## 2015-11-23 MED ORDER — ONDANSETRON HCL 4 MG/2ML IJ SOLN
4.0000 mg | Freq: Once | INTRAMUSCULAR | Status: AC
  Administered 2015-11-23: 4 mg via INTRAVENOUS
  Filled 2015-11-23: qty 2

## 2015-11-23 NOTE — ED Notes (Signed)
Note entered at 01:35 was entered in error,

## 2015-11-23 NOTE — ED Provider Notes (Signed)
[facility] DEPT Provider Note   CSN: [id number] Arrival date & time: 11/23/15  0128     History   Chief Complaint Chief Complaint  Patient presents with  . Back Pain    HPI [name] is a 40 y.o. male.  Patient presents to the ER with acute onset back pain. Patient reports that he bent over and felt a sudden pop in his back followed by severe low back pain with pain radiating to both legs in the left groin. Pain worsens with any movement.      Past Medical History:  Diagnosis Date  . Allergy   . Anxiety   . Chronic neck pain   . Chronic pain   . DDD (degenerative disc disease)   . Herniated cervical disc   . Hypertension   . IBS (irritable bowel syndrome)   . Migraines    since 2002  . Renal calculi   . Shingles   . Sleep apnea    does not wear c-pap    Patient Active Problem List   Diagnosis Date Noted  . 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 S/P cervical spinal fusion 08/15/2013  . Migraine without aura 04/17/2012  . Cervicalgia 04/17/2012    Past Surgical History:  Procedure Laterality Date  . ANTERIOR CERVICAL DECOMP/DISCECTOMY FUSION N/A 08/15/2013   Procedure: Anterior Cervical Four-Five/Five-Six/Six-Seven Decompression and Fusion;  Surgeon: Eustace Moore, MD;  Location: [facility];  Service: Neurosurgery;  Laterality: N/A;  . LITHOTRIPSY    . WISDOM TOOTH EXTRACTION         Home Medications    Prior to Admission medications   Medication Sig Start Date End Date Taking? Authorizing Provider  amoxicillin-clavulanate (AUGMENTIN) 875-125 MG tablet Take 1 tablet by mouth 2 (two) times daily. X 7 days 11/19/15 12/03/15  Mikey Kirschner, MD  clonazePAM (KLONOPIN) 0.5 MG tablet Take 0.5 mg by mouth 2 (two) times daily as needed for anxiety. Reported on 06/12/2015    Historical Provider, MD  DULoxetine (CYMBALTA) 60 MG capsule Take 1 capsule (60 mg total) by mouth daily. 09/10/15   Mikey Kirschner, MD  eletriptan (RELPAX) 40  MG tablet Take 1 tablet (40 mg total) by mouth as needed for migraine. may repeat in 2 hours if necessary as needed for migraines 12/21/13   Shawnee Knapp, MD  fexofenadine (ALLEGRA) 180 MG tablet Take 180 mg by mouth daily.    Historical Provider, MD  lisinopril (PRINIVIL,ZESTRIL) 20 MG tablet Take 1 tablet (20 mg total) by mouth daily. 10/26/15   Mikey Kirschner, MD  ondansetron (ZOFRAN ODT) 8 MG disintegrating tablet Take 1 tablet (8 mg total) by mouth every 8 (eight) hours as needed for nausea or vomiting. 08/24/15   Kathyrn Drown, MD  oxyCODONE-acetaminophen (PERCOCET) 10-325 MG tablet Take 1 tablet by mouth every 4 (four) hours as needed for pain. 08/24/15   Kathyrn Drown, MD  promethazine (PHENERGAN) 25 MG tablet Take 1 tablet (25 mg total) by mouth every 8 (eight) hours as needed for nausea or vomiting. 08/24/15   Kathyrn Drown, MD  propranolol (INDERAL) 20 MG tablet Take 1 tablet (20 mg total) by mouth 2 (two) times daily. 10/26/15   Mikey Kirschner, MD  tamsulosin (FLOMAX) 0.4 MG CAPS capsule 1 qhs till kidney stone passes 08/24/15   Kathyrn Drown, MD  tiZANidine (ZANAFLEX) 4 MG tablet Take 1 tablet (4 mg total) by mouth every 6 (six) hours  as needed. For muscle spasms 12/21/13   Shawnee Knapp, MD    Family History Family History  Problem Relation Age of Onset  . Headache Mother   . Prostate cancer Father   . Kidney cancer Father   . Kidney Stones Sister   . Migraines Sister     Social History Social History  Substance Use Topics  . Smoking status: Never Smoker  . Smokeless tobacco: Never Used  . Alcohol use No     Comment: rarely     Allergies   Erythromycin   Review of Systems Review of Systems  Musculoskeletal: Positive for back pain.     Physical Exam Updated Vital Signs BP (!) 129/101 (BP Location: Left Arm)   Pulse 102   Temp 98.5 F (36.9 C) (Oral)   Resp 20   Ht 5\' 10"  (1.778 m)   Wt 205 lb (93 kg)   SpO2 100%   BMI 29.41 kg/m   Physical Exam    Constitutional: He is oriented to person, place, and time. He appears well-developed and well-nourished. No distress.  HENT:  Head: Normocephalic and atraumatic.  Right Ear: Hearing normal.  Left Ear: Hearing normal.  Nose: Nose normal.  Mouth/Throat: Oropharynx is clear and moist and mucous membranes are normal.  Eyes: Conjunctivae and EOM are normal. Pupils are equal, round, and reactive to light.  Neck: Normal range of motion. Neck supple.  Cardiovascular: Regular rhythm, S1 normal and S2 normal.  Exam reveals no gallop and no friction rub.   No murmur heard. Pulmonary/Chest: Effort normal and breath sounds normal. No respiratory distress. He exhibits no tenderness.  Abdominal: Soft. Normal appearance and bowel sounds are normal. There is no hepatosplenomegaly. There is no tenderness. There is no rebound, no guarding, no tenderness at McBurney's point and negative Murphy's sign. No hernia.  Musculoskeletal: Normal range of motion.  Neurological: He is alert and oriented to person, place, and time. He has normal strength. No cranial nerve deficit or sensory deficit. Coordination normal. GCS eye subscore is 4. GCS verbal subscore is 5. GCS motor subscore is 6.  Reflex Scores:      Patellar reflexes are 2+ on the right side and 2+ on the left side. Pain with raising either leg resulting in painful inhibition, but strength is normal. Sensation normal. No foot drop. Normal sensation.  Skin: Skin is warm, dry and intact. No rash noted. No cyanosis.  Psychiatric: He has a normal mood and affect. His speech is normal and behavior is normal. Thought content normal.  Nursing note and vitals reviewed.    ED Treatments / Results  Labs (all labs ordered are listed, but only abnormal results are displayed) Labs Reviewed - No data to display  EKG  EKG Interpretation None       Radiology Ct Lumbar Spine Wo Contrast  Result Date: 11/23/2015 CLINICAL DATA:  Shooting pain into legs and groin  after bending to assist a patient. EXAM: CT LUMBAR SPINE WITHOUT CONTRAST TECHNIQUE: Multidetector CT imaging of the lumbar spine was performed without intravenous contrast administration. Multiplanar CT image reconstructions were also generated. COMPARISON:  01/30/2014 lumbar spine MRI FINDINGS: Segmentation: Spine labeling as per 01/30/2014 MRI. There is lumbosacral transitional anatomy with left assimilation joint at the lowest this level labeled L5-S1. Alignment: Normal Vertebrae: Small Schmorl's node along the superior endplate of 624THL is unchanged. No fracture, spondylolysis nor spondylolisthesis. Paraspinal and other soft tissues: No retroperitoneal adenopathy. No aortic aneurysm. No nephrolithiasis. Disc levels: T11-T12: No  focal disc herniation, canal stenosis nor neural foraminal encroachment. T12-L1: No focal disc herniation, canal stenosis nor neural foraminal encroachment. L1-L2: No focal disc herniation, canal stenosis nor neural foraminal encroachment. L2-L3: No focal disc herniation, canal stenosis nor neural foraminal encroachment. L3-L4: Minimal concentric disc bulge without focal disc herniation, canal stenosis or significant neural foraminal encroachment. L4-L5: Stable small central to right paracentral disc herniation impressing upon the thecal sac. No significant canal stenosis nor neural foraminal encroachment. L5-S1: No focal disc herniation, canal stenosis nor neural foraminal encroachment. 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 No acute osseous abnormality. Electronically Signed   By: Ashley Royalty M.D.   On: 11/23/2015 02:49    Procedures Procedures (including critical care time)  Medications Ordered in ED Medications  diazepam (VALIUM) injection 5 mg (not administered)  HYDROmorphone (DILAUDID) injection 1 mg (1 mg Intravenous Given 11/23/15 0158)  ondansetron (ZOFRAN) injection 4 mg (4 mg Intravenous Given 11/23/15 0158)  ketorolac  (TORADOL) 30 MG/ML injection 30 mg (30 mg Intravenous Given 11/23/15 0158)  dexamethasone (DECADRON) injection 10 mg (10 mg Intravenous Given 11/23/15 0158)     Initial Impression / Assessment and Plan / ED Course  I have reviewed the triage vital signs and the nursing notes.  Pertinent labs & imaging results that were available during my care of the patient were reviewed by me and considered in my medical decision making (see chart for details).  Clinical Course   Patient presents with acute onset low back pain radiating to both legs after bending over and feeling a pop in his back. Patient does not have any weakness, loss of sensation. No saddle anesthesia. No foot drop. Reflexes are normal. CT scan performed does not show any significant disc herniation. Patient treated with Toradol, Decadron, Dilaudid and Valium. Pain is improved, still having some spasms. Will require rest outpatient analgesia and muscle relaxer.  Final Clinical Impressions(s) / ED Diagnoses   Final diagnoses:  Strain of lumbar region, initial encounter  Muscle spasm of back    New Prescriptions New Prescriptions   No medications on file     Orpah Greek, MD 11/23/15 [phone]

## 2015-11-23 NOTE — ED Notes (Signed)
Pt reports that he was getting up fire wood when he felt a twinge- points to his groin as pain area. He denies any urgency or any incontinence.

## 2015-11-23 NOTE — ED Triage Notes (Signed)
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  Having shooting pains into his legs and groin.

## 2015-11-25 ENCOUNTER — Telehealth: Payer: Self-pay | Admitting: Family Medicine

## 2015-11-25 NOTE — Telephone Encounter (Signed)
Review report of dermatopathology from [facility].

## 2015-11-26 ENCOUNTER — Ambulatory Visit: Payer: 59 | Admitting: Family Medicine

## 2015-11-27 ENCOUNTER — Encounter: Payer: Self-pay | Admitting: Family Medicine

## 2015-11-27 ENCOUNTER — Ambulatory Visit (INDEPENDENT_AMBULATORY_CARE_PROVIDER_SITE_OTHER): Payer: 59 | Admitting: Family Medicine

## 2015-11-27 VITALS — BP 110/80 | Ht 70.0 in | Wt 207.2 lb

## 2015-11-27 DIAGNOSIS — L819 Disorder of pigmentation, unspecified: Secondary | ICD-10-CM

## 2015-11-27 NOTE — Progress Notes (Signed)
   Subjective:    Patient ID: [name], male    DOB: 1975/10/21, 40 y.o.   MRN: [id number]  HPI Patient is here today for a suture removal. Patient is doing very well. Patient has no other concerns at this time.   Results discussed Review of Systems No headache, no major weight loss or weight gain, no chest pain no back pain abdominal pain no change in bowel habits complete ROS otherwise negative     Objective:   Physical Exam Biopsy site healing well sutures removed wound care discussed diagnosis was pigmented nevus       Assessment & Plan:

## 2015-12-02 ENCOUNTER — Other Ambulatory Visit: Payer: Self-pay | Admitting: *Deleted

## 2015-12-02 MED ORDER — LISINOPRIL 20 MG PO TABS: 20.0000 mg | ORAL_TABLET | Freq: Every day | ORAL | 0 refills | Status: DC

## 2015-12-02 MED FILL — LISINOPRIL 20 MG TABLET: 20 | 90 days supply | Qty: 90 | Fill #0

## 2015-12-21 MED FILL — ELETRIPTAN HBR 40 MG TABLET: 40 | 30 days supply | Qty: 9 | Fill #5

## 2015-12-28 ENCOUNTER — Telehealth: Payer: Self-pay | Admitting: Family Medicine

## 2015-12-28 MED ORDER — PROPRANOLOL HCL 20 MG PO TABS
20.0000 mg | ORAL_TABLET | Freq: Two times a day (BID) | ORAL | 0 refills | Status: DC
Start: 2015-12-28 — End: 2016-08-01

## 2015-12-28 MED FILL — PROPRANOLOL 20 MG TABLET: 20 | 90 days supply | Qty: 180 | Fill #0

## 2015-12-28 NOTE — Telephone Encounter (Signed)
Requesting Rx for propranolol (INDERAL) 20 MG tablet.  He says he has about 7 days worth but will be going out of town this weekend and is hoping to get filled ASAP.  [facility]

## 2015-12-28 NOTE — Telephone Encounter (Signed)
Left message on voicemail notifying patient med sent to pharmacy.  

## 2016-02-02 ENCOUNTER — Ambulatory Visit (INDEPENDENT_AMBULATORY_CARE_PROVIDER_SITE_OTHER): Payer: 59 | Admitting: Family Medicine

## 2016-02-02 ENCOUNTER — Encounter: Payer: Self-pay | Admitting: Family Medicine

## 2016-02-02 VITALS — BP 118/80 | Temp 98.7°F | Ht 70.0 in | Wt 200.0 lb

## 2016-02-02 DIAGNOSIS — J310 Chronic rhinitis: Secondary | ICD-10-CM

## 2016-02-02 DIAGNOSIS — J329 Chronic sinusitis, unspecified: Secondary | ICD-10-CM | POA: Diagnosis not present

## 2016-02-02 DIAGNOSIS — J111 Influenza due to unidentified influenza virus with other respiratory manifestations: Secondary | ICD-10-CM | POA: Diagnosis not present

## 2016-02-02 MED ORDER — BENZONATATE 100 MG PO CAPS: 100.0000 mg | ORAL_CAPSULE | Freq: Two times a day (BID) | ORAL | 0 refills | Status: DC | PRN

## 2016-02-02 MED ORDER — AZITHROMYCIN 250 MG PO TABS
ORAL_TABLET | ORAL | 0 refills | Status: DC
Start: 2016-02-02 — End: 2016-05-05

## 2016-02-02 NOTE — Progress Notes (Signed)
Sat started as headache diffuse in nature. Dim enry. Ears started hurting. Got achey over all   No fever. Chills . Diffuse pain . Energy depleted overall.  Wiped out  Has co worker with pneumonia  Using muvcinerx prn   Ros  No vom or diarrhea  No diarrhea  Review systems no high fevers next  Physical exam vitals stable mild malaise H&T moderate nasal congestion frontal tenderness intermittent bronchial cough heart rhythm.  Impression post influenza rhinosinusitis/bronchitis plan antibiotics prescribed. Symptom care discussed warning signs discussed

## 2016-02-16 ENCOUNTER — Telehealth: Payer: Self-pay | Admitting: Family Medicine

## 2016-02-16 NOTE — Telephone Encounter (Signed)
[employer] sent over on patient to recertify his FMLA that expires 02/23/16 it was for 6 month intermittent and he has not had follow up appointment on his migraines. Does migraines require follow up appointment to get a full year covered under FMLA.

## 2016-02-17 NOTE — Telephone Encounter (Signed)
I see him intermittent anyway, not opposed to authorizing on going fmla even tho not seen fro that exct reason every six mo

## 2016-03-07 MED FILL — ELETRIPTAN HBR 40 MG TABLET: 40 | 30 days supply | Qty: 9 | Fill #6

## 2016-04-06 ENCOUNTER — Other Ambulatory Visit: Payer: Self-pay | Admitting: Family Medicine

## 2016-04-06 MED FILL — LISINOPRIL 20 MG TABLET: 20 | 90 days supply | Qty: 90 | Fill #0

## 2016-04-12 MED FILL — ELETRIPTAN HBR 40 MG TABLET: 40 | 30 days supply | Qty: 9 | Fill #7

## 2016-05-05 ENCOUNTER — Encounter: Payer: Self-pay | Admitting: Nurse Practitioner

## 2016-05-05 ENCOUNTER — Ambulatory Visit (INDEPENDENT_AMBULATORY_CARE_PROVIDER_SITE_OTHER): Payer: 59 | Admitting: Nurse Practitioner

## 2016-05-05 VITALS — BP 116/86 | Temp 98.9°F | Ht 70.0 in | Wt 196.6 lb

## 2016-05-05 DIAGNOSIS — L03032 Cellulitis of left toe: Secondary | ICD-10-CM

## 2016-05-05 MED ORDER — AMOXICILLIN-POT CLAVULANATE 875-125 MG PO TABS: 1.0000 | ORAL_TABLET | Freq: Two times a day (BID) | ORAL | 0 refills | Status: DC

## 2016-05-05 NOTE — Progress Notes (Signed)
Subjective:  Presents for complaints of possible infection in the left great toe for the past few days. No fever. Noticed some slight greenish drainage at one point. No history of injury.  Objective:   BP 116/86   Temp 98.9 F (37.2 C)   Ht 5\' 10"  (1.778 m)   Wt 196 lb 9.6 oz (89.2 kg)   BMI 28.21 kg/m  NAD. Alert, oriented. Mild edema and moderate erythema noted along the medial edge of the left great toenail. Mildly tender to palpation. No active drainage. No evidence of significant ingrown toenail. Slight open area noted near the nail.   Assessment:  Cellulitis of toe of left foot    Plan:   Meds ordered this encounter  Medications  . amoxicillin-clavulanate (AUGMENTIN) 875-125 MG tablet    Sig: Take 1 tablet by mouth 2 (two) times daily.    Dispense:  14 tablet    Refill:  0    Order Specific Question:   Supervising Provider    Answer:   Mikey Kirschner [[id number]]   Warm soaks. Ibuprofen as directed for pain. Warning signs reviewed. Call back in 4 days if no improvement, sooner if worse. Keep area clean and dry as possible.

## 2016-06-01 ENCOUNTER — Other Ambulatory Visit: Payer: Self-pay

## 2016-06-01 NOTE — Progress Notes (Unsigned)
This medication was originally prescribed by a doctor at urgent care. May we refill

## 2016-06-02 MED ORDER — ELETRIPTAN HYDROBROMIDE 40 MG PO TABS: 40.0000 mg | ORAL_TABLET | ORAL | 0 refills | Status: DC | PRN

## 2016-06-02 NOTE — Progress Notes (Signed)
Pt late on six mo ck up for htn, ref times one

## 2016-06-02 NOTE — Progress Notes (Signed)
Refill sent in to pharmacy 

## 2016-06-29 ENCOUNTER — Ambulatory Visit (INDEPENDENT_AMBULATORY_CARE_PROVIDER_SITE_OTHER): Payer: 59 | Admitting: Family Medicine

## 2016-06-29 ENCOUNTER — Encounter: Payer: Self-pay | Admitting: Family Medicine

## 2016-06-29 VITALS — BP 134/86 | Temp 98.2°F | Ht 70.0 in | Wt 198.0 lb

## 2016-06-29 DIAGNOSIS — L600 Ingrowing nail: Secondary | ICD-10-CM | POA: Diagnosis not present

## 2016-06-29 DIAGNOSIS — L03032 Cellulitis of left toe: Secondary | ICD-10-CM

## 2016-06-29 MED ORDER — DOXYCYCLINE HYCLATE 100 MG PO TABS: 100.0000 mg | ORAL_TABLET | Freq: Two times a day (BID) | ORAL | 0 refills | Status: DC

## 2016-06-29 NOTE — Progress Notes (Signed)
Subjective:     Patient ID: [name], male   DOB: 1975-03-24, 41 y.o.   MRN: [id number]  HPIIngrown toenail left great toe. Tried warm soaks.   Patient seen earlier in the year for flare up of this. Positive history of prior ingrown nails. Has never required a partial excision in the past Review of Systems No headache, no major weight loss or weight gain, no chest pain no back pain abdominal pain no change in bowel habits complete ROS otherwise negative     Objective:   Physical Exam Alert vitals stable, NAD. Blood pressure good on repeat. HEENT normal. Lungs clear. Heart regular rate and rhythm.  Patient was anesthetized with a digital block. He was prepped draped the medial third of his toenail was removed. Dressing applied.    Assessment:     Impression ingrown nail with element of cellulitis. Antibiotics prescribed. Partial toenail excision performed/proper wound care proper nail management discussed    Plan:

## 2016-08-01 ENCOUNTER — Other Ambulatory Visit: Payer: Self-pay | Admitting: Family Medicine

## 2016-08-01 MED FILL — PROPRANOLOL 20 MG TABLET: 20 | 30 days supply | Qty: 60 | Fill #0

## 2016-08-01 MED FILL — LISINOPRIL 20 MG TABLET: 20 | 30 days supply | Qty: 30 | Fill #0

## 2016-08-01 NOTE — Telephone Encounter (Signed)
Thirty days worth ea, write final refill before next visit on it

## 2016-08-04 DIAGNOSIS — H524 Presbyopia: Secondary | ICD-10-CM | POA: Diagnosis not present

## 2016-08-04 DIAGNOSIS — H52223 Regular astigmatism, bilateral: Secondary | ICD-10-CM | POA: Diagnosis not present

## 2016-08-04 DIAGNOSIS — H5213 Myopia, bilateral: Secondary | ICD-10-CM | POA: Diagnosis not present

## 2016-08-05 ENCOUNTER — Telehealth: Payer: Self-pay | Admitting: Family Medicine

## 2016-08-05 NOTE — Telephone Encounter (Signed)
Pt is requesting a prescription for cpap supplies.     [facility]

## 2016-08-11 ENCOUNTER — Telehealth: Payer: Self-pay | Admitting: Family Medicine

## 2016-08-11 ENCOUNTER — Encounter: Payer: Self-pay | Admitting: Family Medicine

## 2016-08-11 ENCOUNTER — Ambulatory Visit (INDEPENDENT_AMBULATORY_CARE_PROVIDER_SITE_OTHER): Payer: 59 | Admitting: Family Medicine

## 2016-08-11 VITALS — BP 122/82 | Ht 70.0 in | Wt 195.4 lb

## 2016-08-11 DIAGNOSIS — I10 Essential (primary) hypertension: Secondary | ICD-10-CM | POA: Insufficient documentation

## 2016-08-11 DIAGNOSIS — Z13220 Encounter for screening for lipoid disorders: Secondary | ICD-10-CM | POA: Diagnosis not present

## 2016-08-11 DIAGNOSIS — G43009 Migraine without aura, not intractable, without status migrainosus: Secondary | ICD-10-CM | POA: Diagnosis not present

## 2016-08-11 DIAGNOSIS — G4733 Obstructive sleep apnea (adult) (pediatric): Secondary | ICD-10-CM

## 2016-08-11 MED ORDER — LISINOPRIL 20 MG PO TABS: ORAL_TABLET | ORAL | 5 refills | Status: DC

## 2016-08-11 MED ORDER — ELETRIPTAN HYDROBROMIDE 40 MG PO TABS: 40.0000 mg | ORAL_TABLET | ORAL | 5 refills | Status: DC | PRN

## 2016-08-11 MED ORDER — PROPRANOLOL HCL 20 MG PO TABS: 20.0000 mg | ORAL_TABLET | Freq: Two times a day (BID) | ORAL | 5 refills | Status: DC

## 2016-08-11 MED FILL — ELETRIPTAN HBR 40 MG TABLET: 40 | 30 days supply | Qty: 9 | Fill #0 | Status: TO

## 2016-08-11 NOTE — Patient Instructions (Signed)
Ck bp every two weeks or so, six number, is over half the top number is lesw than 110, call us

## 2016-08-11 NOTE — Telephone Encounter (Signed)
Patient had FMLA faxed over to be filled out,please review,sign date. Give to front staff for payment and make copy for Epic. In yellow folder.

## 2016-08-11 NOTE — Telephone Encounter (Signed)
Pt was seen by Dr. Richardson Landry today & OV note states order was written for new CPAP

## 2016-08-11 NOTE — Progress Notes (Signed)
   Subjective:    Patient ID: [name], male    DOB: 1975-06-04, 41 y.o.   MRN: [id number] Patient arrives office for numerous concerns. HPI Patient arrives for a follow up on migraines and to follow up on FMLA for migraines.   Patient would also like to discuss getting a new CPAP machine. C Pap machine definitely helped. Without it significant fatigue. Has not had a new machine for nearly a decade. Without the machine has a lot of daytime sluggishness and makes his blood pressure worse. the   Long standing hx of migraines  Had cerv surg which was also adding to things  On cpap for 8 yrs r more  Still gets five per ten for mo, but if intervnes quickly symtoms are cut before getting bad  Needs FMLA coverage for this   [facility] for the CPAP  And pt has disablility co forom [employer]   Blood pressure medicine and blood pressure levels reviewed today with patient. Compliant with blood pressure medicine. States does not miss a dose. No obvious side effects. Blood pressure generally good when checked elsewhere. Watching salt intake.  bp numbrs good   exercise overall good, feeling better physicall and mentally  Much improved     cpap mahine definite Review of Systems No headache, no major weight loss or weight gain, no chest pain no back pain abdominal pain no change in bowel habits complete ROS otherwise negative     Objective:   Physical Exam Alert and oriented, vitals reviewed and stable, NAD ENT-TM's and ext canals WNL bilat via otoscopic exam Soft palate, tonsils and post pharynx WNL via oropharyngeal exam Neck-symmetric, no masses; thyroid nonpalpable and nontender Pulmonary-no tachypnea or accessory muscle use; Clear without wheezes via auscultation Card--no abnrml murmurs, rhythm reg and rate WNL Carotid pulses symmetric, without bruits        Assessment & Plan:  Impression 1 hypertension good control but numbers a bit tight I've encouraged the patient  follow his own numbers and call us if systolic mostly drops below 110 #2 migraine headaches recurrent with meds helping maintain #3 sleep apnea ongoing with need for new C Pap machine prescription written

## 2016-08-18 ENCOUNTER — Telehealth: Payer: Self-pay | Admitting: Family Medicine

## 2016-08-18 NOTE — Telephone Encounter (Signed)
Please sign Auto CPAP order & forward to [place] so it can be faxed with required documentation  In red folder in yellow box

## 2016-08-18 NOTE — Telephone Encounter (Signed)
Completed.

## 2016-08-23 NOTE — Telephone Encounter (Signed)
Faxed order to [facility] 08/22/16

## 2016-08-26 ENCOUNTER — Telehealth: Payer: Self-pay | Admitting: *Deleted

## 2016-08-26 NOTE — Telephone Encounter (Signed)
Please see [facility] from [facility]. Letter in dr steve's folder.  Order for replacement cpap. They need current offic visit  Notes within the last 6 months that speak of the patients usage of his cpap equipment and benefit of therapy, demographics and insurance info Fax to [phone].

## 2016-09-07 MED FILL — LISINOPRIL 20 MG TAB: 20 | 90 days supply | Qty: 90 | Fill #0 | Status: TO

## 2016-09-13 ENCOUNTER — Ambulatory Visit (INDEPENDENT_AMBULATORY_CARE_PROVIDER_SITE_OTHER): Payer: 59 | Admitting: Family Medicine

## 2016-09-13 ENCOUNTER — Ambulatory Visit: Payer: 59 | Admitting: Family Medicine

## 2016-09-13 ENCOUNTER — Encounter: Payer: Self-pay | Admitting: Family Medicine

## 2016-09-13 VITALS — BP 110/72 | Temp 98.5°F | Ht 70.0 in | Wt 192.0 lb

## 2016-09-13 DIAGNOSIS — L03031 Cellulitis of right toe: Secondary | ICD-10-CM | POA: Diagnosis not present

## 2016-09-13 MED ORDER — AMOXICILLIN-POT CLAVULANATE 875-125 MG PO TABS: 1.0000 | ORAL_TABLET | Freq: Two times a day (BID) | ORAL | 0 refills | Status: AC

## 2016-09-13 NOTE — Progress Notes (Signed)
   Subjective:    Patient ID: YASHUA BRACCO, male    DOB: 26-Dec-1975, 41 y.o.   MRN: [id number]  Toe Pain   Incident onset: a few days. Pain location: right great toe. Treatments tried: warm soaks, vinegar, ibuprofen.   Patient recalls no injury. No recent tight shoes. Strong personal history of recurrent ingrown toenails.  No obvious fever or chills.  Some discharge at location   Review of Systems No headache, no major weight loss or weight gain, no chest pain no back pain abdominal pain no change in bowel habits complete ROS otherwise negative     Objective:   Physical Exam . Alert vitals stable, NAD. Blood pressure good on repeat. HEENT normal. Lungs clear. Heart regular rate and rhythm. Right great toe medial erythema tenderness some discharge pressure      Assessment & Plan:  Impression toe cellulitis plan antibiotics prescribed symptom care discussed local measures discussed hold off on surgical intervention rationale discussed

## 2016-09-27 ENCOUNTER — Encounter: Payer: Self-pay | Admitting: Family Medicine

## 2016-09-27 ENCOUNTER — Ambulatory Visit (INDEPENDENT_AMBULATORY_CARE_PROVIDER_SITE_OTHER): Payer: 59 | Admitting: Family Medicine

## 2016-09-27 VITALS — BP 114/80 | Ht 70.0 in | Wt 192.0 lb

## 2016-09-27 DIAGNOSIS — Z029 Encounter for administrative examinations, unspecified: Secondary | ICD-10-CM

## 2016-09-27 DIAGNOSIS — F4321 Adjustment disorder with depressed mood: Secondary | ICD-10-CM

## 2016-09-27 DIAGNOSIS — F5101 Primary insomnia: Secondary | ICD-10-CM | POA: Diagnosis not present

## 2016-09-27 DIAGNOSIS — F4320 Adjustment disorder, unspecified: Secondary | ICD-10-CM

## 2016-09-27 MED ORDER — ALPRAZOLAM 0.5 MG PO TABS: ORAL_TABLET | ORAL | 0 refills | Status: DC

## 2016-09-27 NOTE — Progress Notes (Signed)
   Subjective:    Patient ID: [name], male    DOB: Jul 30, 1975, 41 y.o.   MRN: [id number] Patient arrives office for very protracted discussion Depression         This is a new problem.  The current episode started in the past 7 days.   Associated symptoms include insomnia and decreased interest.     The symptoms are aggravated by family issues (Father passed away last night).  Fa just ied of metastatic cancer. He just died yesterday. Had a history of cancer but took a turn for the worst 10 days ago. Prograf patient states that 2 weeks ago he was feeling okay. Doing with stresses at work but not feeling down. A week and a half ago his father became suddenly very sick. He's been hospital day and night since. Has had periods anxiety. Trouble sleeping. Rapid heart rate.  Now that his father's passways having periods of feeling down crying spells, and showing some regrets about the mid distant family member    Fa and mo , lived up [place], [place] admitted ten d ago with meta canr   Review of Systems  Psychiatric/Behavioral: Positive for depression. The patient has insomnia.        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 Warning signs side effects benefits discussed  Greater than 50% of this 25 minute face to face visit was spent in counseling and discussion and  coordination of care regarding the above diagnosis/diagnosies

## 2016-10-26 ENCOUNTER — Ambulatory Visit: Payer: 59 | Admitting: Family Medicine

## 2016-10-27 ENCOUNTER — Encounter: Payer: Self-pay | Admitting: Family Medicine

## 2016-10-27 ENCOUNTER — Ambulatory Visit (INDEPENDENT_AMBULATORY_CARE_PROVIDER_SITE_OTHER): Payer: 59 | Admitting: Family Medicine

## 2016-10-27 VITALS — Temp 98.1°F | Ht 70.0 in | Wt 191.2 lb

## 2016-10-27 DIAGNOSIS — L600 Ingrowing nail: Secondary | ICD-10-CM

## 2016-10-27 DIAGNOSIS — M79674 Pain in right toe(s): Secondary | ICD-10-CM

## 2016-10-27 MED ORDER — AMOXICILLIN-POT CLAVULANATE 875-125 MG PO TABS: 1.0000 | ORAL_TABLET | Freq: Two times a day (BID) | ORAL | 0 refills | Status: AC

## 2016-10-27 NOTE — Progress Notes (Signed)
   Subjective:    Patient ID: [name], male    DOB: September 01, 1975, 41 y.o.   MRN: [id number]  HPI Patient arrives with c/o infected/painful right toenail for one week(patient started some amoxil he had at home)  Patient has history of recurrent ingrown toenail. This from flared up in recent weeks. Painful swelling. Noticed tenderness. Was not wearing a normal footwear. Did not trim nails wrongly. Just has a tendency  Review of Systems No headache, no major weight loss or weight gain, no chest pain no back pain abdominal pain no change in bowel habits complete ROS otherwise negative     Objective:   Physical Exam Alert vitals stable, NAD. Blood pressure good on repeat. HEENT normal. Lungs clear. Heart regular rate and rhythm. Great toe inflamed medial portion active discharge clear ingrown nail noted.  Procedure note. Patient was anesthetized with a digital block. Prepped. Treat. Under sterile precautions patient medial quarter of the nail was removed to the base. Excess tissue trimmed. Dressing applied. Wound care discussed.       Assessment & Plan:  Partial toenail excision

## 2016-11-07 ENCOUNTER — Telehealth: Payer: Self-pay | Admitting: *Deleted

## 2016-11-07 MED ORDER — TAMSULOSIN HCL 0.4 MG PO CAPS: 0.4000 mg | ORAL_CAPSULE | Freq: Every day | ORAL | 0 refills | Status: DC

## 2016-11-07 NOTE — Telephone Encounter (Signed)
Patient has called again regarding the flonase that he needs called into [facility] so he can pass his kidney stone. I made patient aware I spoke with the nurse and the nurse stated she will see if Dr Wolfgang Phoenix will call him in something.

## 2016-11-07 NOTE — Telephone Encounter (Signed)
Patient called stating he is having flank pain around his groin area and his urine is a little dark, patient states he is getting a kidney stone, patient states he has had these multiple times and Dr Wolfgang Phoenix will call him in flonase patient states he is sure this is what this is for, patient said it relaxes the bladder. But Dr Wolfgang Phoenix has called it in before for him. Please advise [phone] or [phone]

## 2016-11-07 NOTE — Telephone Encounter (Signed)
Prescription sent electronically to pharmacy. Patient notified and advised to follow up if ongoing problems.

## 2016-11-07 NOTE — Telephone Encounter (Signed)
Consult with Dr Richardson Landry  :May call in Flomax 0.4mg  one at bedtime #30 -follow up if ongoing problems

## 2016-11-09 MED FILL — ELETRIPTAN HBR 40 MG TABLET: 40 | 30 days supply | Qty: 9 | Fill #0

## 2016-11-09 MED FILL — PROPRANOLOL 20 MG TABLET: 20 | 30 days supply | Qty: 60 | Fill #0

## 2016-11-28 ENCOUNTER — Encounter: Payer: Self-pay | Admitting: Family Medicine

## 2016-11-28 ENCOUNTER — Ambulatory Visit (INDEPENDENT_AMBULATORY_CARE_PROVIDER_SITE_OTHER): Payer: 59 | Admitting: Family Medicine

## 2016-11-28 VITALS — BP 128/82 | Temp 98.1°F | Ht 70.0 in | Wt 194.2 lb

## 2016-11-28 DIAGNOSIS — J310 Chronic rhinitis: Secondary | ICD-10-CM

## 2016-11-28 DIAGNOSIS — J329 Chronic sinusitis, unspecified: Secondary | ICD-10-CM | POA: Diagnosis not present

## 2016-11-28 MED ORDER — CEFPROZIL 500 MG PO TABS: 500.0000 mg | ORAL_TABLET | Freq: Two times a day (BID) | ORAL | 0 refills | Status: DC

## 2016-11-28 MED ORDER — BENZONATATE 100 MG PO CAPS: 100.0000 mg | ORAL_CAPSULE | Freq: Four times a day (QID) | ORAL | 0 refills | Status: DC | PRN

## 2016-11-28 NOTE — Progress Notes (Signed)
Subjective:    Patient ID: [name], male    DOB: August 31, 1975, 41 y.o.   MRN: [id number]  Cough  This is a new problem. The current episode started in the past 7 days. Associated symptoms include headaches, nasal congestion and a sore throat. Associated symptoms comments: Diarrhea . He has tried OTC cough suppressant for the symptoms.    yest morn statred and hit fairly hard  Bad watering of nose and drange  cpg bad at  times  achey all over  Lot sof diarrhea  Some potential exposur thru work   Energy low    appetitte down , felt bad di not go to work , diminished energy  whooe thing aching      Review of Systems  HENT: Positive for sore throat.   Respiratory: Positive for cough.   Neurological: Positive for headaches.       Objective:   Physical Exam Alert, mild malaise. Hydration good Vitals stable. frontal/ maxillary tenderness evident positive nasal congestion. pharynx normal neck supple  lungs clear/no crackles or wheezes. heart regular in rhythm        Assessment & Plan:  Impression rhinosinusitis likely post viral, discussed with patient. plan antibiotics prescribed. Questions answered. Symptomatic care discussed. warning signs discussed. WSL

## 2016-12-19 ENCOUNTER — Telehealth: Payer: Self-pay | Admitting: Family Medicine

## 2016-12-19 NOTE — Telephone Encounter (Signed)
Pt is aware he will come in tomorrow for an appt.

## 2016-12-19 NOTE — Telephone Encounter (Signed)
It would be best to do a formal evaluation of tomorrow would be fine

## 2016-12-19 NOTE — Telephone Encounter (Signed)
I spoke with the pt and he states his dtr was seen here last wk and while he was here had mentioned that he was sick as well. He states you told him to get a humidifier and he is now wanting to know what to do . He is still having pressure in right side of face and runny nose, he says it is very draining on his body. He says if he needs to he can come in for an appt tomorrow am.Please advise.

## 2016-12-19 NOTE — Telephone Encounter (Signed)
Patient seen Dr. Richardson Landry on 11/28/16 for rhinosinusitis.  He has been using saline spray, allegra D, and sudafed.  He is still having a swollen face and irritated especially on the right side. He has a cough which is causing his chest to hurt and he is very drained.  He would like to know what is recommended.  [name]

## 2016-12-20 ENCOUNTER — Encounter: Payer: Self-pay | Admitting: Family Medicine

## 2016-12-20 ENCOUNTER — Encounter: Payer: Self-pay | Admitting: Nurse Practitioner

## 2016-12-20 ENCOUNTER — Ambulatory Visit (INDEPENDENT_AMBULATORY_CARE_PROVIDER_SITE_OTHER): Payer: 59 | Admitting: Nurse Practitioner

## 2016-12-20 VITALS — BP 118/80 | Temp 98.2°F | Ht 70.0 in | Wt 195.0 lb

## 2016-12-20 DIAGNOSIS — J0120 Acute ethmoidal sinusitis, unspecified: Secondary | ICD-10-CM

## 2016-12-20 MED ORDER — LEVOFLOXACIN 500 MG PO TABS: 500.0000 mg | ORAL_TABLET | Freq: Every day | ORAL | 0 refills | Status: DC

## 2016-12-20 MED ORDER — METHYLPREDNISOLONE ACETATE 40 MG/ML IJ SUSP
40.0000 mg | Freq: Once | INTRAMUSCULAR | Status: AC
  Administered 2016-12-20: 40 mg via INTRAMUSCULAR

## 2016-12-20 NOTE — Progress Notes (Signed)
Subjective: Presents for complaints of sinus symptoms over the past week.  Last seen for an upper respiratory issue on 11/28/2016.  The symptoms had resolved.  Now having a burning pain on the right side of the sinuses.  Very frequent sneezing.  Occasional cough.  Mainly clear drainage.  Has tried saline and a steroid nasal spray as well as OTC antihistamines and decongestants with slight relief.  Now having ethmoid sinus area headache.  Sore throat.  No fever.  No ear pain.  Objective:   BP 118/80   Temp 98.2 F (36.8 C) (Oral)   Ht 5\' 10"  (1.778 m)   Wt 195 lb (88.5 kg)   BMI 27.98 kg/m  NAD.  Alert, oriented.  Significant cloudy effusion noted on the right TM, no erythema or exudate.  Left TM retracted, no erythema.  Pharynx posterior mildly erythematous with PND noted.  Neck supple with mild soft anterior adenopathy.  Lungs clear.  Heart regular rate and rhythm.  Assessment:  Acute non-recurrent ethmoidal sinusitis - Plan: methylPREDNISolone acetate (DEPO-MEDROL) injection 40 mg    Plan:   Meds ordered this encounter  Medications  . levofloxacin (LEVAQUIN) 500 MG tablet    Sig: Take 1 tablet (500 mg total) by mouth daily.    Dispense:  10 tablet    Refill:  0    Order Specific Question:   Supervising Provider    Answer:   Mikey Kirschner [[id number]]  . methylPREDNISolone acetate (DEPO-MEDROL) injection 40 mg   Continue OTC meds as directed.  Call back in 48-72 hours if no improvement, consider oral prednisone taper at that time.  Warning signs reviewed.  Recheck if worsens or persists.

## 2017-01-16 MED FILL — LISINOPRIL 20 MG TABLET: 20 | 30 days supply | Qty: 30 | Fill #0

## 2017-01-16 MED FILL — PROPRANOLOL 20 MG TABLET: 20 | 30 days supply | Qty: 60 | Fill #1

## 2017-01-16 MED FILL — ELETRIPTAN HBR 40 MG TABLET: 40 | 30 days supply | Qty: 9 | Fill #1

## 2017-02-02 ENCOUNTER — Telehealth: Payer: Self-pay | Admitting: Family Medicine

## 2017-02-02 NOTE — Telephone Encounter (Signed)
I spoke with patient who talk with [employer] this morning they told him to use old FMLA form to update the  dates and sign and date with today date.Please sign and date highlighted areas and I will fax over. In yellow folder.

## 2017-02-07 ENCOUNTER — Ambulatory Visit: Payer: 59 | Admitting: Family Medicine

## 2017-02-16 DIAGNOSIS — Z029 Encounter for administrative examinations, unspecified: Secondary | ICD-10-CM

## 2017-02-17 ENCOUNTER — Other Ambulatory Visit: Payer: Self-pay | Admitting: Family Medicine

## 2017-02-17 MED FILL — ELETRIPTAN HBR 40 MG TABLET: 40 | 30 days supply | Qty: 6 | Fill #2

## 2017-02-17 MED FILL — LISINOPRIL 20 MG TABLET: 20 | 30 days supply | Qty: 30 | Fill #0

## 2017-03-02 ENCOUNTER — Encounter: Payer: Self-pay | Admitting: Family Medicine

## 2017-03-02 ENCOUNTER — Ambulatory Visit (INDEPENDENT_AMBULATORY_CARE_PROVIDER_SITE_OTHER): Payer: No Typology Code available for payment source | Admitting: Family Medicine

## 2017-03-02 VITALS — BP 126/78 | Temp 98.6°F | Ht 70.0 in | Wt 191.0 lb

## 2017-03-02 DIAGNOSIS — G43009 Migraine without aura, not intractable, without status migrainosus: Secondary | ICD-10-CM

## 2017-03-02 DIAGNOSIS — J029 Acute pharyngitis, unspecified: Secondary | ICD-10-CM

## 2017-03-02 DIAGNOSIS — J020 Streptococcal pharyngitis: Secondary | ICD-10-CM

## 2017-03-02 LAB — POCT RAPID STREP A (OFFICE): Rapid Strep A Screen: NEGATIVE

## 2017-03-02 MED ORDER — ONDANSETRON HCL 8 MG PO TABS: 8.0000 mg | ORAL_TABLET | Freq: Three times a day (TID) | ORAL | 2 refills | Status: DC | PRN

## 2017-03-02 MED ORDER — METHYLPREDNISOLONE ACETATE 40 MG/ML IJ SUSP
40.0000 mg | Freq: Once | INTRAMUSCULAR | Status: AC
  Administered 2017-03-02: 40 mg via INTRAMUSCULAR

## 2017-03-02 MED ORDER — OXYCODONE-ACETAMINOPHEN 5-325 MG PO TABS: 1.0000 | ORAL_TABLET | ORAL | 0 refills | Status: DC | PRN

## 2017-03-02 MED ORDER — AMOXICILLIN 500 MG PO TABS: 500.0000 mg | ORAL_TABLET | Freq: Three times a day (TID) | ORAL | 1 refills | Status: DC

## 2017-03-02 NOTE — Progress Notes (Signed)
   Subjective:    Patient ID: [name], male    DOB: 17-Mar-1975, 41 y.o.   MRN: [id number]  HPI  Patient is here today with complaints of sore throat,chills,body aches and headache.Complaining of left side of eye and face hurt.This started night before last. Has a hx of migrane. Nausea . Patient complains of severe pain behind his left eye he states it is like a severe migraine with nausea throbbing photophobia phonophobia symptoms present over the past couple days triggered by recent illness of sore throat fever and chills denies neck stiffness denies vomiting does relate nausea Review of Systems  Constitutional: Negative for activity change, chills and fever.  HENT: Positive for rhinorrhea and sore throat. Negative for congestion and ear pain.   Eyes: Negative for discharge.  Respiratory: Negative for cough and wheezing.   Cardiovascular: Negative for chest pain.  Gastrointestinal: Negative for nausea and vomiting.  Musculoskeletal: Negative for arthralgias.  Neurological: Positive for headaches. Negative for seizures, syncope and numbness.       Objective:   Physical Exam  Constitutional: He appears well-developed.  HENT:  Head: Normocephalic and atraumatic.  Mouth/Throat: No oropharyngeal exudate.  Throat erythematous tonsil region is swollen as well as uvula swollen and red airway is patent no obstruction  Eyes: Right eye exhibits no discharge. Left eye exhibits no discharge.  Neck: Normal range of motion.  Cardiovascular: Normal rate, regular rhythm and normal heart sounds.  No murmur heard. Pulmonary/Chest: Effort normal and breath sounds normal. No respiratory distress. He has no wheezes. He has no rales.  Lymphadenopathy:    He has no cervical adenopathy.  Neurological: He exhibits normal muscle tone.  Skin: Skin is warm and dry.  Nursing note and vitals reviewed.  Neck is supple no sign of meningitis no sign of pneumonia  Rapid strep is negative patient has  impressive erythema of the throat and the uvula I believe the patient would benefit from a round of antibiotics The patient requests medication to help with the pain in the throat a shot of steroids would actually help with some of the erythema swelling and pain    Assessment & Plan:  Pharyngitis probable strep treat with antibiotics warning signs discussed  Severe migraine Percocet for severe pain states he cannot tolerate hydrocodone patient was instructed if worse over the next 24-48 hours or if not improving go to ER for further evaluation and possible injection  If frequent migraines continue to play the patient recommend follow-up for discussion of other measures  Patient does not appear toxic but certainly feels ill with this migraine hopefully this medication will help if not he is to go to the ER for further evaluation and relief

## 2017-03-03 LAB — STREP A DNA PROBE: Strep Gp A Direct, DNA Probe: NEGATIVE

## 2017-03-03 LAB — SPECIMEN STATUS REPORT

## 2017-03-21 ENCOUNTER — Telehealth: Payer: Self-pay | Admitting: Family Medicine

## 2017-03-21 NOTE — Telephone Encounter (Addendum)
Patient advised per Dr Richardson Landry flu med only useful if started first 48 hrs of symtoms, may well have had, if doesn't improve soon, may have secondary bacteria infection and would rec office visit  for that. Patient verbalized understanding.

## 2017-03-21 NOTE — Telephone Encounter (Signed)
Pt wonders if he's got the flu if it's worth being seen at this point  States since Sunday he's had No fever but has had chills, body aches, HA, cough, sore throat  Please advise    [facility]

## 2017-03-21 NOTE — Telephone Encounter (Signed)
Flu med only useful if started first 48 hrs of symtoms, may well have had, if doesn't improve soon, may habve secondary bact enfxn and would rec o v for that

## 2017-03-23 ENCOUNTER — Encounter: Payer: Self-pay | Admitting: Family Medicine

## 2017-03-23 ENCOUNTER — Ambulatory Visit (INDEPENDENT_AMBULATORY_CARE_PROVIDER_SITE_OTHER): Payer: No Typology Code available for payment source | Admitting: Family Medicine

## 2017-03-23 VITALS — BP 136/90 | Temp 98.4°F | Ht 70.0 in | Wt 191.2 lb

## 2017-03-23 DIAGNOSIS — J310 Chronic rhinitis: Secondary | ICD-10-CM

## 2017-03-23 DIAGNOSIS — J329 Chronic sinusitis, unspecified: Secondary | ICD-10-CM | POA: Diagnosis not present

## 2017-03-23 MED ORDER — CEFPROZIL 500 MG PO TABS: 500.0000 mg | ORAL_TABLET | Freq: Two times a day (BID) | ORAL | 0 refills | Status: DC

## 2017-03-23 MED ORDER — ALBUTEROL SULFATE HFA 108 (90 BASE) MCG/ACT IN AERS: 2.0000 | INHALATION_SPRAY | Freq: Four times a day (QID) | RESPIRATORY_TRACT | 2 refills | Status: DC | PRN

## 2017-03-23 MED FILL — ELETRIPTAN HBR 40 MG TABLET: 40 | 30 days supply | Qty: 6 | Fill #3

## 2017-03-23 MED FILL — LISINOPRIL 20 MG TABLET: 20 | 30 days supply | Qty: 30 | Fill #1

## 2017-03-23 NOTE — Progress Notes (Signed)
   Subjective:    Patient ID: [name], male    DOB: [birth year], 42 y.o.   MRN: [id number]  Sore Throat   This is a new problem. The current episode started in the past 7 days. Associated symptoms include coughing, ear pain, headaches and vomiting. Associated symptoms comments: Nosebleed, chills, infection(had URI a couple weeks ago), wheezing, fatigue. He has tried acetaminophen (Theaflu, Sudafed, Dayquil, Nyquil, Ibuprofen, Hot tea, steam from shower, cold compress) for the symptoms.  Pt states he had to miss 3 days of work due to no energy, pins and needles feeling, and achy all over.    Nancy Fetter felt a little off, not bad, did go to work  Energy very bad   Suddenly got worse   Head achey diffusely, behind the eyes  Also back of head   No fever pos chills     Review of Systems  HENT: Positive for ear pain.   Respiratory: Positive for cough.   Gastrointestinal: Positive for vomiting.  Neurological: Positive for headaches.       Objective:   Physical Exam  Alert vitals reviewed, moderate malaise. Hydration good. Positive nasal congestion lungs no crackles or wheezes, no tachypnea, intermittent bronchial cough during exam heart regular rate and rhythm.       Assessment & Plan:  Impression influenza discussed at length. Petra Kuba of illness and potential sequela discussed. Plan Tamiflu prescribed if indicated and timing appropriate. Symptom care discussed. Warning signs discussed. WSL Antibiotics prescribed for secondary sinusitis/bronchitis too late for Tamiflu

## 2017-03-28 ENCOUNTER — Other Ambulatory Visit: Payer: Self-pay | Admitting: *Deleted

## 2017-03-28 MED ORDER — ONDANSETRON HCL 8 MG PO TABS: 8.0000 mg | ORAL_TABLET | Freq: Three times a day (TID) | ORAL | 2 refills | Status: DC | PRN

## 2017-05-03 ENCOUNTER — Telehealth: Payer: Self-pay | Admitting: Family Medicine

## 2017-05-03 DIAGNOSIS — I10 Essential (primary) hypertension: Secondary | ICD-10-CM

## 2017-05-03 DIAGNOSIS — Z0000 Encounter for general adult medical examination without abnormal findings: Secondary | ICD-10-CM

## 2017-05-03 DIAGNOSIS — Z13220 Encounter for screening for lipoid disorders: Secondary | ICD-10-CM

## 2017-05-03 NOTE — Telephone Encounter (Signed)
Patient needing labs done for physical coming up in May.

## 2017-05-03 NOTE — Telephone Encounter (Signed)
Orders put in. Pt notified.  

## 2017-05-03 NOTE — Telephone Encounter (Signed)
Rep same 

## 2017-05-03 NOTE — Telephone Encounter (Signed)
Last Labs : Lipid, Liver and Met 7 07/2016

## 2017-05-05 LAB — BASIC METABOLIC PANEL
BUN/Creatinine Ratio: 15 (ref 9–20)
BUN: 12 mg/dL (ref 6–24)
CO2: 22 mmol/L (ref 20–29)
Calcium: 9.5 mg/dL (ref 8.7–10.2)
Chloride: 99 mmol/L (ref 96–106)
Creatinine, Ser: 0.78 mg/dL (ref 0.76–1.27)
GFR calc Af Amer: 128 mL/min/{1.73_m2} (ref >59)
GFR calc non Af Amer: 111 mL/min/{1.73_m2} (ref >59)
Glucose: 104 mg/dL — ABNORMAL HIGH (ref 65–99)
Potassium: 4.1 mmol/L (ref 3.5–5.2)
Sodium: 141 mmol/L (ref 134–144)

## 2017-05-05 LAB — LIPID PANEL
Chol/HDL Ratio: 6.1 ratio — ABNORMAL HIGH (ref 0.0–5.0)
Cholesterol, Total: 183 mg/dL (ref 100–199)
HDL: 30 mg/dL — ABNORMAL LOW (ref >39)
Triglycerides: 473 mg/dL — ABNORMAL HIGH (ref 0–149)

## 2017-05-05 LAB — HEPATIC FUNCTION PANEL
ALT: 16 IU/L (ref 0–44)
AST: 17 IU/L (ref 0–40)
Albumin: 4.6 g/dL (ref 3.5–5.5)
Alkaline Phosphatase: 95 IU/L (ref 39–117)
Bilirubin Total: 0.5 mg/dL (ref 0.0–1.2)
Bilirubin, Direct: 0.16 mg/dL (ref 0.00–0.40)
Total Protein: 7.7 g/dL (ref 6.0–8.5)

## 2017-05-08 MED FILL — PROPRANOLOL 20 MG TABLET: 20 | 30 days supply | Qty: 60 | Fill #2

## 2017-05-08 MED FILL — ELETRIPTAN HBR 40 MG TABLET: 40 | 30 days supply | Qty: 6 | Fill #4

## 2017-05-15 ENCOUNTER — Telehealth: Payer: Self-pay | Admitting: Family Medicine

## 2017-05-15 NOTE — Telephone Encounter (Signed)
Got blood work results in his my chart and has a question about his triglycerides being high. He drank coffee and ate popcorn before he had his labs done...Marland KitchenMarland Kitchen

## 2017-05-15 NOTE — Telephone Encounter (Signed)
See lab results from 05/04/17

## 2017-05-15 NOTE — Telephone Encounter (Signed)
Will disc at wellness was rising when last cked, purpose of wellness in part is to disc this

## 2017-05-15 NOTE — Telephone Encounter (Signed)
Patient notified and verbalized understanding. 

## 2017-06-06 MED FILL — ELETRIPTAN HBR 40 MG TABLET: 40 | 30 days supply | Qty: 6 | Fill #5

## 2017-06-06 MED FILL — PROPRANOLOL 20 MG TABLET: 20 | 30 days supply | Qty: 60 | Fill #3

## 2017-06-08 ENCOUNTER — Ambulatory Visit (INDEPENDENT_AMBULATORY_CARE_PROVIDER_SITE_OTHER): Payer: No Typology Code available for payment source | Admitting: Family Medicine

## 2017-06-08 ENCOUNTER — Encounter: Payer: Self-pay | Admitting: Family Medicine

## 2017-06-08 VITALS — BP 138/82 | Ht 70.0 in | Wt 190.6 lb

## 2017-06-08 DIAGNOSIS — D229 Melanocytic nevi, unspecified: Secondary | ICD-10-CM | POA: Diagnosis not present

## 2017-06-08 DIAGNOSIS — G4733 Obstructive sleep apnea (adult) (pediatric): Secondary | ICD-10-CM

## 2017-06-08 DIAGNOSIS — I10 Essential (primary) hypertension: Secondary | ICD-10-CM

## 2017-06-08 DIAGNOSIS — G43009 Migraine without aura, not intractable, without status migrainosus: Secondary | ICD-10-CM | POA: Diagnosis not present

## 2017-06-08 DIAGNOSIS — Z125 Encounter for screening for malignant neoplasm of prostate: Secondary | ICD-10-CM

## 2017-06-08 DIAGNOSIS — Z0000 Encounter for general adult medical examination without abnormal findings: Secondary | ICD-10-CM

## 2017-06-08 MED ORDER — LISINOPRIL 20 MG PO TABS: ORAL_TABLET | ORAL | 5 refills | Status: DC

## 2017-06-08 MED ORDER — PROPRANOLOL HCL 20 MG PO TABS: 20.0000 mg | ORAL_TABLET | Freq: Two times a day (BID) | ORAL | 5 refills | Status: DC

## 2017-06-08 NOTE — Progress Notes (Signed)
Subjective:    Patient ID: [name], male    DOB: Nov 26, 1975, 42 y.o.   MRN: [id number] Patient arrives with numerous chronic concerns along with wellness exam HPI  The patient comes in today for a wellness visit.    A review of their health history was completed.  A review of medications was also completed.  Any needed refills; yes-lisinopril  Eating habits: eating healthy  Falls/  MVA accidents in past few months: none  Regular exercise: yes- walking and lifting  Specialist pt sees on regular basis: no  Preventative health issues were discussed.   Additional concerns: saw a spot of concern on skin cancer screen at work  Blood pressure medicine and blood pressure levels reviewed today with patient. Compliant with blood pressure medicine. States does not miss a dose. No obvious side effects. Blood pressure generally good when checked elsewhere. Watching salt intake.  exercis e multi times per wk, three to four d per wk, doing wight traing   Doing for th past  Month    Migraines still get thjem, not as often, not as severe  Maybe two times per mo or so , Strong fam hx of migraines  Compliant with migraine medicine but no obvious side effects.  Utilizes cpap for obst sleep apnea.  States definitely helps.  Concerned because he saw the dermatologist Dr. Eddie Dibbles screen in clinic.  Dr. Nevada Crane expressed concern about area on the back.  Therefore patient wonders what to do.  He has seen a dermatologist in the past  Results for orders placed or performed in visit on 05/03/17  Lipid panel  Result Value Ref Range   Cholesterol, Total 183 100 - 199 mg/dL   Triglycerides 473 (H) 0 - 149 mg/dL   HDL 30 (L) >39 mg/dL   VLDL Cholesterol Cal Comment 5 - 40 mg/dL   LDL Calculated Comment 0 - 99 mg/dL   Chol/HDL Ratio 6.1 (H) 0.0 - 5.0 ratio  Hepatic function panel  Result Value Ref Range   Total Protein 7.7 6.0 - 8.5 g/dL   Albumin 4.6 3.5 - 5.5 g/dL   Bilirubin Total 0.5  0.0 - 1.2 mg/dL   Bilirubin, Direct 0.16 0.00 - 0.40 mg/dL   Alkaline Phosphatase 95 39 - 117 IU/L   AST 17 0 - 40 IU/L   ALT 16 0 - 44 IU/L  Basic metabolic panel  Result Value Ref Range   Glucose 104 (H) 65 - 99 mg/dL   BUN 12 6 - 24 mg/dL   Creatinine, Ser 0.78 0.76 - 1.27 mg/dL   GFR calc non Af Amer 111 >59 mL/min/1.73   GFR calc Af Amer 128 >59 mL/min/1.73   BUN/Creatinine Ratio 15 9 - 20   Sodium 141 134 - 144 mmol/L   Potassium 4.1 3.5 - 5.2 mmol/L   Chloride 99 96 - 106 mmol/L   CO2 22 20 - 29 mmol/L   Calcium 9.5 8.7 - 10.2 mg/dL     Pt had creamer plus coffee plus pop corn    Review of Systems  Constitutional: Negative for activity change, appetite change and fever.  HENT: Negative for congestion and rhinorrhea.   Eyes: Negative for discharge.  Respiratory: Negative for cough and wheezing.   Cardiovascular: Negative for chest pain.  Gastrointestinal: Negative for abdominal pain, blood in stool and vomiting.  Genitourinary: Negative for difficulty urinating and frequency.  Musculoskeletal: Negative for neck pain.  Skin: Negative for rash.  Allergic/Immunologic: Negative for environmental  allergies and food allergies.  Neurological: Negative for weakness and headaches.  Psychiatric/Behavioral: Negative for agitation.  All other systems reviewed and are negative.      Objective:   Physical Exam  Constitutional: He appears well-developed and well-nourished.  HENT:  Head: Normocephalic and atraumatic.  Right Ear: External ear normal.  Left Ear: External ear normal.  Nose: Nose normal.  Mouth/Throat: Oropharynx is clear and moist.  Eyes: Pupils are equal, round, and reactive to light. EOM are normal.  Neck: Normal range of motion. Neck supple. No thyromegaly present.  Cardiovascular: Normal rate, regular rhythm and normal heart sounds.  No murmur heard. Pulmonary/Chest: Effort normal and breath sounds normal. No respiratory distress. He has no wheezes.    Abdominal: Soft. Bowel sounds are normal. He exhibits no distension and no mass. There is no tenderness.  Genitourinary: Penis normal.  Musculoskeletal: Normal range of motion. He exhibits no edema.  Lymphadenopathy:    He has no cervical adenopathy.  Neurological: He is alert. He exhibits normal muscle tone.  Skin: Skin is warm and dry. No erythema.  Psychiatric: He has a normal mood and affect. His behavior is normal. Judgment normal.   Scan of back reveals multiple nevi.  None  particularly worrisome in their appearance or pattern.       Assessment & Plan:  1 impression wellness exam.  Blood work discussed.  Diet discussed.  Exercise discussed.  We will add PSA with family history of p prostate cancer.  2.  Hypertension good control discussed maintain same meds compliance discussed  3.  Migraine headaches chronic nature stable discussed to maintain same meds  4.  Multiple nevi none appear particularly concerning however since dermatologist recommend ongoing follow-up will arrange this  5.  Sleep discussed patient compliant with cpapdevice  Plan 6 months.  Meds refilled.  Blood work is noted.  Referral isdone

## 2017-06-09 LAB — PSA: Prostate Specific Ag, Serum: 1.1 ng/mL (ref 0.0–4.0)

## 2017-06-11 ENCOUNTER — Encounter: Payer: Self-pay | Admitting: Family Medicine

## 2017-06-15 ENCOUNTER — Encounter: Payer: Self-pay | Admitting: Family Medicine

## 2017-06-15 ENCOUNTER — Encounter (INDEPENDENT_AMBULATORY_CARE_PROVIDER_SITE_OTHER): Payer: Self-pay

## 2017-06-27 ENCOUNTER — Emergency Department (HOSPITAL_COMMUNITY)
Admission: EM | Admit: 2017-06-27 | Discharge: 2017-06-27 | Disposition: A | Discharge status: Home / Self Care | Payer: PRIVATE HEALTH INSURANCE | Attending: Emergency Medicine | Admitting: Emergency Medicine

## 2017-06-27 ENCOUNTER — Emergency Department (HOSPITAL_COMMUNITY): Payer: PRIVATE HEALTH INSURANCE

## 2017-06-27 ENCOUNTER — Encounter (HOSPITAL_COMMUNITY): Payer: Self-pay | Admitting: Emergency Medicine

## 2017-06-27 ENCOUNTER — Telehealth: Payer: Self-pay | Admitting: Family Medicine

## 2017-06-27 ENCOUNTER — Other Ambulatory Visit: Payer: Self-pay

## 2017-06-27 DIAGNOSIS — I10 Essential (primary) hypertension: Secondary | ICD-10-CM | POA: Diagnosis not present

## 2017-06-27 DIAGNOSIS — R0789 Other chest pain: Secondary | ICD-10-CM | POA: Insufficient documentation

## 2017-06-27 DIAGNOSIS — M545 Low back pain, unspecified: Secondary | ICD-10-CM

## 2017-06-27 DIAGNOSIS — Z79899 Other long term (current) drug therapy: Secondary | ICD-10-CM | POA: Insufficient documentation

## 2017-06-27 LAB — BASIC METABOLIC PANEL WITH GFR
Anion gap: 9 (ref 5–15)
BUN: 14 mg/dL (ref 6–20)
CO2: 28 mmol/L (ref 22–32)
Calcium: 9.8 mg/dL (ref 8.9–10.3)
Chloride: 104 mmol/L (ref 101–111)
Creatinine, Ser: 0.91 mg/dL (ref 0.61–1.24)
GFR calc Af Amer: >60 mL/min (ref >60)
GFR calc non Af Amer: >60 mL/min (ref >60)
Glucose, Bld: 86 mg/dL (ref 65–99)
Potassium: 3.8 mmol/L (ref 3.5–5.1)
Sodium: 141 mmol/L (ref 135–145)

## 2017-06-27 LAB — CBC
HCT: 46.6 % (ref 39.0–52.0)
Hemoglobin: 16.3 g/dL (ref 13.0–17.0)
MCH: 30.3 pg (ref 26.0–34.0)
MCHC: 35 g/dL (ref 30.0–36.0)
MCV: 86.6 fL (ref 78.0–100.0)
Platelets: 325 K/uL (ref 150–400)
RBC: 5.38 MIL/uL (ref 4.22–5.81)
RDW: 12.2 % (ref 11.5–15.5)
WBC: 6.5 K/uL (ref 4.0–10.5)

## 2017-06-27 LAB — D-DIMER, QUANTITATIVE: D-Dimer, Quant: <0.27 ug/mL-FEU (ref 0.00–0.50)

## 2017-06-27 LAB — TROPONIN I: Troponin I: <0.03 ng/mL (ref <0.03)

## 2017-06-27 MED ORDER — PREDNISONE 20 MG PO TABS: ORAL_TABLET | ORAL | 0 refills | Status: DC

## 2017-06-27 MED ORDER — FENTANYL CITRATE (PF) 100 MCG/2ML IJ SOLN
25.0000 ug | Freq: Once | INTRAMUSCULAR | Status: AC
  Administered 2017-06-27: 25 ug via INTRAVENOUS
  Filled 2017-06-27: qty 2

## 2017-06-27 MED ORDER — FAMOTIDINE 20 MG PO TABS: 20.0000 mg | ORAL_TABLET | Freq: Two times a day (BID) | ORAL | 0 refills | Status: DC

## 2017-06-27 MED ORDER — DIAZEPAM 5 MG PO TABS
5.0000 mg | ORAL_TABLET | Freq: Once | ORAL | Status: AC
  Administered 2017-06-27: 5 mg via ORAL
  Filled 2017-06-27: qty 1

## 2017-06-27 MED ORDER — DEXAMETHASONE SODIUM PHOSPHATE 10 MG/ML IJ SOLN
10.0000 mg | Freq: Once | INTRAMUSCULAR | Status: AC
  Administered 2017-06-27: 10 mg via INTRAVENOUS
  Filled 2017-06-27: qty 1

## 2017-06-27 MED ORDER — KETOROLAC TROMETHAMINE 30 MG/ML IJ SOLN
30.0000 mg | Freq: Once | INTRAMUSCULAR | Status: AC
  Administered 2017-06-27: 30 mg via INTRAVENOUS
  Filled 2017-06-27: qty 1

## 2017-06-27 MED ORDER — CYCLOBENZAPRINE HCL 5 MG PO TABS: 5.0000 mg | ORAL_TABLET | Freq: Three times a day (TID) | ORAL | 0 refills | Status: DC | PRN

## 2017-06-27 MED FILL — LISINOPRIL 20 MG TABLET: 20 | 30 days supply | Qty: 30 | Fill #2

## 2017-06-27 MED FILL — FAMOTIDINE 20 MG TABLET: 20 | 10 days supply | Qty: 20 | Fill #0

## 2017-06-27 MED FILL — CYCLOBENZAPRINE 5 MG TABLET: 5 | 10 days supply | Qty: 30 | Fill #0

## 2017-06-27 MED FILL — predniSONE 20 MG TABS: 20 | 9 days supply | Qty: 18 | Fill #0

## 2017-06-27 NOTE — Discharge Instructions (Addendum)
Use ice and heat for comfort. Take the medications as prescribed. You can take ibuprofen 600 mg + acetaminophen 1000 mg every 6 hrs for pain as needed. You should call the [facility] to be seen today at [phone]. They may want you to call Dr Ronnald Ramp' office to get an appointment to have him evaluate your back pain if it's not improving.

## 2017-06-27 NOTE — ED Provider Notes (Signed)
[facility] EMERGENCY DEPARTMENT Provider Note   CSN: [id number] Arrival date & time: 06/27/17  0086  Time seen 03:55 AM   History   Chief Complaint Chief Complaint  Patient presents with  . Chest Pain    HPI [name] is a 42 y.o. male.  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  As he went to move it he jerked his back.  He did not hear a pop but had immediate pain.  He has pain in his lower back and sometimes he gets a shooting pain into his left inguinal area.  He describes as a catching pain when he moves certain ways especially moves his left leg, and otherwise a constant soreness in his back.  He reports about 10 years ago he had some problems of his back and was diagnosed with degenerative disc disease and actually had steroid injections done.  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 Ronnald Ramp.  He also reports for the past few days he has been having a lower central chest pain that he has had before.  He states he has been told he has esophageal spasms.  He describes his pain is aching and it last a few minutes.  Sometimes it makes him feel short of breath, he has chronic nausea but no vomiting tonight.  He states he noted tonight his palms were sweaty and became concerned.  PCP Mikey Kirschner, MD  Past Medical History:  Diagnosis Date  . Allergy   . Anxiety   . Chronic neck pain   . Chronic pain   . DDD (degenerative disc disease)   . Herniated cervical disc   . Hypertension   . IBS (irritable bowel syndrome)   . Migraines    since 2002  . Renal calculi   . Shingles   . Sleep apnea    does not wear c-pap    Patient Active Problem List   Diagnosis Date Noted  . Essential hypertension 08/11/2016  . Sleep apnea, obstructive 08/11/2016  . Lumbar pain with  radiation down left leg 01/30/2014  . S/P cervical spinal fusion 08/15/2013  . Migraine without aura 04/17/2012  . Cervicalgia 04/17/2012    Past Surgical History:  Procedure Laterality Date  . ANTERIOR CERVICAL DECOMP/DISCECTOMY FUSION N/A 08/15/2013   Procedure: Anterior Cervical Four-Five/Five-Six/Six-Seven Decompression and Fusion;  Surgeon: Eustace Moore, MD;  Location: [facility];  Service: Neurosurgery;  Laterality: N/A;  . LITHOTRIPSY    . WISDOM TOOTH EXTRACTION          Home Medications    Prior to Admission medications   Medication Sig Start Date End Date Taking? Authorizing Provider  albuterol (PROVENTIL HFA;VENTOLIN HFA) 108 (90 Base) MCG/ACT inhaler Inhale 2 puffs into the lungs every 6 (six) hours as needed for wheezing or shortness of breath. 03/23/17   Mikey Kirschner, MD  amoxicillin (AMOXIL) 500 MG tablet Take 1 tablet (500 mg total) by mouth 3 (three) times daily. Patient not taking: Reported on 06/08/2017 03/02/17   Kathyrn Drown, MD  cefPROZIL (CEFZIL) 500 MG tablet Take 1 tablet (500 mg total) by mouth 2 (two) times daily. Patient not taking: Reported on 06/08/2017 03/23/17   Mikey Kirschner, MD  cyclobenzaprine (FLEXERIL) 5 MG tablet Take 1 tablet (5 mg total) by mouth 3 (three) times daily as needed. 06/27/17   Rolland Porter, MD  eletriptan (RELPAX) 40 MG tablet Take 1 tablet (40 mg total) by mouth as needed for migraine. may repeat in 2 hours if necessary as needed for migraines 08/11/16   Mikey Kirschner, MD  famotidine (PEPCID) 20 MG tablet Take 1 tablet (20 mg total) by mouth 2 (two) times daily. 06/27/17   Rolland Porter, MD  lisinopril (PRINIVIL,ZESTRIL) 20 MG tablet TAKE 1 TABLET BY MOUTH DAILY. 06/08/17   Mikey Kirschner, MD  ondansetron (ZOFRAN) 8 MG tablet Take 1 tablet (8 mg total) by mouth every 8 (eight) hours as needed for nausea. 03/28/17   Mikey Kirschner, MD  oxyCODONE-acetaminophen (PERCOCET/ROXICET) 5-325 MG tablet Take 1 tablet by mouth every 4  (four) hours as needed. Patient not taking: Reported on 06/08/2017 03/02/17   Kathyrn Drown, MD  predniSONE (DELTASONE) 20 MG tablet Take 3 po QD x 3d , then 2 po QD x 3d then 1 po QD x 3d 06/27/17   Rolland Porter, MD  propranolol (INDERAL) 20 MG tablet Take 1 tablet (20 mg total) by mouth 2 (two) times daily. 06/08/17   Mikey Kirschner, MD    Family History Family History  Problem Relation Age of Onset  . Headache Mother   . Prostate cancer Father   . Kidney cancer Father   . Kidney Stones Sister   . Migraines Sister     Social History Social History   Tobacco Use  . Smoking status: Never Smoker  . Smokeless tobacco: Never Used  Substance Use Topics  . Alcohol use: No    Comment: rarely  . Drug use: No  employed at [employer]   Allergies   Erythromycin   Review of Systems Review of Systems  All other systems reviewed and are negative.    Physical Exam Updated Vital Signs BP (!) 165/111 (BP Location: Left Arm)   Pulse 84   Temp 98.4 F (36.9 C) (Oral)   Resp 16   Wt 86.2 kg (190 lb)   SpO2 99%   BMI 27.26 kg/m   Vital signs normal except for diastolic hypertension   Physical Exam  Constitutional: He is oriented to person, place, and time. He appears well-developed and well-nourished.  Non-toxic appearance. He does not appear ill. No distress.  HENT:  Head: Normocephalic and atraumatic.  Right Ear: External ear normal.  Left Ear: External ear normal.  Nose: Nose normal. No mucosal edema or rhinorrhea.  Mouth/Throat: Oropharynx is clear and moist and mucous membranes are normal. No dental abscesses or uvula swelling.  Eyes: Pupils are equal, round, and reactive to light. Conjunctivae and EOM are normal.  Neck: Normal range of motion and full passive range of motion without pain. Neck supple.  Cardiovascular: Normal rate, regular rhythm and normal heart sounds. Exam reveals no gallop and no friction rub.  No murmur heard. Pulmonary/Chest: Effort normal and  breath sounds normal. No respiratory distress. He has no wheezes. He has no rhonchi. He has no rales. He exhibits tenderness. He exhibits no crepitus.    Abdominal: Soft. Normal appearance and bowel sounds are normal. He exhibits no distension. There is no tenderness. There is no rebound and no guarding.  Musculoskeletal: Normal range of motion. He exhibits no edema or tenderness.       Back:  When I palpate his thoracic and lumbar spine he  is tender in the lower lumbar spine, he is not tender over the SI joints.  He does not have pain with left or right lumbar flexion however forward flexion is full.  He has straight leg raising bilaterally.  His patellar reflexes are 3+ and equal bilaterally.  Neurological: He is alert and oriented to person, place, and time. He has normal strength. No cranial nerve deficit.  Skin: Skin is warm, dry and intact. No rash noted. No erythema. No pallor.  Psychiatric: His speech is normal and behavior is normal. His mood appears anxious.  Nursing note and vitals reviewed.    ED Treatments / Results  Labs (all labs ordered are listed, but only abnormal results are displayed) Results for orders placed or performed during the hospital encounter of 32/35/57  Basic metabolic panel  Result Value Ref Range   Sodium 141 135 - 145 mmol/L   Potassium 3.8 3.5 - 5.1 mmol/L   Chloride 104 101 - 111 mmol/L   CO2 28 22 - 32 mmol/L   Glucose, Bld 86 65 - 99 mg/dL   BUN 14 6 - 20 mg/dL   Creatinine, Ser 0.91 0.61 - 1.24 mg/dL   Calcium 9.8 8.9 - 10.3 mg/dL   GFR calc non Af Amer >60 >60 mL/min   GFR calc Af Amer >60 >60 mL/min   Anion gap 9 5 - 15  CBC  Result Value Ref Range   WBC 6.5 4.0 - 10.5 K/uL   RBC 5.38 4.22 - 5.81 MIL/uL   Hemoglobin 16.3 13.0 - 17.0 g/dL   HCT 46.6 39.0 - 52.0 %   MCV 86.6 78.0 - 100.0 fL   MCH 30.3 26.0 - 34.0 pg   MCHC 35.0 30.0 - 36.0 g/dL   RDW 12.2 11.5 - 15.5 %   Platelets 325 150 - 400 K/uL  Troponin I  Result Value Ref  Range   Troponin I <0.03 <0.03 ng/mL  D-dimer, quantitative (not at [facility])  Result Value Ref Range   D-Dimer, Quant <0.27 0.00 - 0.50 ug/mL-FEU   Laboratory interpretation all normal    EKG EKG Interpretation  Date/Time:  Tuesday June 27 2017 03:20:05 EDT Ventricular Rate:  81 PR Interval:    QRS Duration: 98 QT Interval:  354 QTC Calculation: 411 R Axis:   59 Text Interpretation:  Sinus rhythm ST elev, probable normal early repol pattern No significant change since last tracing 16 Dec 2012 Confirmed by Rolland Porter [id number]) on 06/27/2017 3:43:45 AM   Radiology Dg Chest 2 View  Result Date: 06/27/2017 CLINICAL DATA:  Initial evaluation for acute intermittent chest pain. EXAM: CHEST - 2 VIEW COMPARISON:  Prior radiograph from 12/16/2012. FINDINGS: The cardiac and mediastinal silhouettes are stable in size and contour, and remain within normal limits. The lungs are normally inflated. No airspace consolidation, pleural effusion, or pulmonary edema is identified. There is no pneumothorax. No acute osseous abnormality identified.  Cervical ACDF noted. IMPRESSION: No active cardiopulmonary disease. Electronically Signed   By: Jeannine Boga M.D.   On: 06/27/2017 04:08    Procedures Procedures (including critical care time)  Medications Ordered in ED Medications  ketorolac (TORADOL) 30 MG/ML injection 30 mg (30 mg Intravenous Given 06/27/17 0413)  diazepam (VALIUM) tablet 5 mg (5 mg Oral Given 06/27/17 0414)  dexamethasone (DECADRON) injection 10 mg (10 mg Intravenous Given 06/27/17 0414)  fentaNYL (SUBLIMAZE) injection 25 mcg (25 mcg Intravenous Given 06/27/17 0523)     Initial Impression / Assessment and Plan / ED  Course  I have reviewed the triage vital signs and the nursing notes.  Pertinent labs & imaging results that were available during my care of the patient were reviewed by me and considered in my medical decision making (see chart for details).     Patient's pain  appears to be musculoskeletal with may be some mild radiculopathy on the left.  His chest wall is tender and reproduces his complaints of chest pain. He was given Toradol IV, Decadron IV, and oral Valium due to nationwide shortage of IV Valium.He states he has someone who can pick him up from work.  Final Clinical Impressions(s) / ED Diagnoses   Final diagnoses:  Acute midline low back pain without sciatica  Chest wall pain    ED Discharge Orders        Ordered    predniSONE (DELTASONE) 20 MG tablet     06/27/17 0522    cyclobenzaprine (FLEXERIL) 5 MG tablet  3 times daily PRN     06/27/17 0522    famotidine (PEPCID) 20 MG tablet  2 times daily     06/27/17 0522      Plan discharge  Rolland Porter, MD, Barbette Or, MD 06/27/17 [phone]

## 2017-06-27 NOTE — Telephone Encounter (Signed)
Notified patient.

## 2017-06-27 NOTE — ED Triage Notes (Signed)
Pt C/O chest pain that has been on and off for the last few days. Pt states when he moves certain ways it feels like a "catch." Pt states he feels clammy and SOB.

## 2017-06-27 NOTE — Telephone Encounter (Signed)
We do not do workmen's comp, for many reasons, one of the reasons is because most compaines have their own docxtors that process and manage workmen's comp injuries, [employer] , for instance, does that, and he will have to find out thru them who manges w c injuries, if for some reason [facility] is no longer doing this (which I seriously doubt) pt will need to go to occupational doc because we do not do workmens comp at all. btw this is proper answer for all patients that call with this question

## 2017-06-27 NOTE — Telephone Encounter (Signed)
Patient was seen in ED over the night for back pain.  He suffered an injury at work a couple of days ago and this will be considered workers comp.  Patient wants to know if he can schedule an appointment with Dr. Richardson Landry to follow up on this workers comp injury?

## 2017-06-28 ENCOUNTER — Telehealth: Payer: Self-pay | Admitting: Family Medicine

## 2017-06-28 ENCOUNTER — Encounter: Payer: Self-pay | Admitting: Family Medicine

## 2017-06-28 NOTE — Telephone Encounter (Signed)
[place] care systems wanting 5 years of records prior to 6/10 2019 back pain on which we didn't see patient .He was seen at [facility].Tried to explain to [place] that we dont handle workers comp here-but they still want records,please review letter in Engineer, civil (consulting).

## 2017-06-28 NOTE — Telephone Encounter (Signed)
May send--this is kind of hard ball insurance co behavior looking for chronic problems when p t experiences acute injury while at work

## 2017-06-29 ENCOUNTER — Other Ambulatory Visit: Payer: Self-pay | Admitting: *Deleted

## 2017-07-25 MED FILL — ELETRIPTAN HBR 40 MG TABLET: 40 | 30 days supply | Qty: 6 | Fill #6

## 2017-07-25 MED FILL — LISINOPRIL 20 MG TABLET: 20 | 30 days supply | Qty: 30 | Fill #3

## 2017-08-04 ENCOUNTER — Telehealth: Payer: Self-pay | Admitting: Family Medicine

## 2017-08-04 NOTE — Telephone Encounter (Signed)
Patient had FMLA faxed over to be filled out. Please review,date sign. In your yellow folder.

## 2017-08-04 NOTE — Telephone Encounter (Signed)
In yellow folder

## 2017-08-08 ENCOUNTER — Telehealth: Payer: Self-pay | Admitting: Family Medicine

## 2017-08-08 DIAGNOSIS — Z0289 Encounter for other administrative examinations: Secondary | ICD-10-CM

## 2017-08-08 NOTE — Telephone Encounter (Signed)
Patient called today checking on FMLA. I explained to patient that doctor has form for review and signature.He states needs to be faxed in by 08/11/17.

## 2017-08-22 ENCOUNTER — Other Ambulatory Visit: Payer: Self-pay | Admitting: Family Medicine

## 2017-09-12 MED FILL — ELETRIPTAN HBR 40 MG TABLET: 40 | 30 days supply | Qty: 10 | Fill #0

## 2017-09-27 ENCOUNTER — Encounter: Payer: Self-pay | Admitting: Family Medicine

## 2017-09-27 ENCOUNTER — Ambulatory Visit (INDEPENDENT_AMBULATORY_CARE_PROVIDER_SITE_OTHER): Payer: No Typology Code available for payment source | Admitting: Family Medicine

## 2017-09-27 VITALS — Ht 70.0 in | Wt 181.8 lb

## 2017-09-27 DIAGNOSIS — R21 Rash and other nonspecific skin eruption: Secondary | ICD-10-CM

## 2017-09-27 MED ORDER — TRIAMCINOLONE ACETONIDE 0.1 % EX CREA: 1.0000 "application " | TOPICAL_CREAM | Freq: Two times a day (BID) | CUTANEOUS | 2 refills | Status: DC

## 2017-09-27 MED ORDER — DOXYCYCLINE HYCLATE 100 MG PO TABS
100.0000 mg | ORAL_TABLET | Freq: Two times a day (BID) | ORAL | 0 refills | Status: DC
Start: 2017-09-27 — End: 2017-12-07

## 2017-09-27 MED FILL — LISINOPRIL 20 MG TABLET: 20 | 30 days supply | Qty: 30 | Fill #4

## 2017-09-27 NOTE — Progress Notes (Signed)
   Subjective:    Patient ID: [name], male    DOB: 05-Jan-1976, 42 y.o.   MRN: [id number]  HPI  Patient arrives with painful rash for 4 days.   Cropped up fairly quickly    gpot painfu fairly quickly     Patient actually has several types of rashes.  On his buttocks.  Has a pimple-like rash.  Bilateral came up some are somewhat tender.  On left arm right ankle left lateral neck has a patchy itchy rash  Right forearm has a tender small patch erythematous and tender to touch Review of Systems No headache, no major weight loss or weight gain, no chest pain no back pain abdominal pain no change in bowel habits complete ROS otherwise negative     Objective:   Physical Exam   Alert vitals stable, NAD. Blood pressure good on repeat. HEENT normal. Lungs clear. Heart regular rate and rhythm. Right ankle lateral neck left wrist contact dermatitis  Right forearm secondarily infected rash tender erythematous buttocks folliculitis  Impression as above  Triamcinolone twice daily cream to dermatitis.  Doxycycline oral for antibiotic for infectious component.  Warning signs discussed     Assessment & Plan:

## 2017-09-29 ENCOUNTER — Other Ambulatory Visit: Payer: Self-pay

## 2017-09-29 MED ORDER — PREDNISONE 20 MG PO TABS: ORAL_TABLET | ORAL | 0 refills | Status: DC

## 2017-09-29 MED ORDER — PREDNISONE 20 MG PO TABS
ORAL_TABLET | ORAL | 0 refills | Status: DC
Start: 2017-09-29 — End: 2017-09-29

## 2017-09-29 NOTE — Addendum Note (Signed)
Addended by: Mikey Kirschner on: 09/29/2017 04:43 PM   Modules accepted: Orders

## 2017-10-12 ENCOUNTER — Telehealth: Payer: Self-pay | Admitting: Family Medicine

## 2017-10-12 MED ORDER — SULFAMETHOXAZOLE-TRIMETHOPRIM 800-160 MG PO TABS: 1.0000 | ORAL_TABLET | Freq: Two times a day (BID) | ORAL | 0 refills | Status: DC

## 2017-10-12 MED ORDER — MOMETASONE FUROATE 0.1 % EX CREA: 1.0000 "application " | TOPICAL_CREAM | Freq: Two times a day (BID) | CUTANEOUS | 0 refills | Status: DC

## 2017-10-12 NOTE — Telephone Encounter (Signed)
Patient was seen for rash on 9/11 stating it hasnt gotten any better on upper part on the back of his legs still sore and covered with a rash. [facility]

## 2017-10-12 NOTE — Telephone Encounter (Signed)
Prescriptions sent electronically to pharmacy. Patient notified. °

## 2017-10-12 NOTE — Telephone Encounter (Signed)
Pt had rash plus secondary infxn, will cover again bactrim ds bid ten d, elocon cr bid to rash 45 g

## 2017-10-12 NOTE — Telephone Encounter (Signed)
Diagnosed with contact dermatitis and given triamcinolone cream.

## 2017-10-27 MED FILL — PROPRANOLOL 20 MG TABLET: 20 | 30 days supply | Qty: 60 | Fill #0

## 2017-10-27 MED FILL — ELETRIPTAN HYDROBROMIDE 40: 40 | 30 days supply | Qty: 10 | Fill #1

## 2017-10-27 MED FILL — LISINOPRIL 20 MG TABLET: 20 | 30 days supply | Qty: 30 | Fill #5

## 2017-11-07 ENCOUNTER — Telehealth: Payer: Self-pay | Admitting: *Deleted

## 2017-11-07 ENCOUNTER — Other Ambulatory Visit: Payer: Self-pay | Admitting: Family Medicine

## 2017-11-07 ENCOUNTER — Telehealth: Payer: Self-pay | Admitting: Family Medicine

## 2017-11-07 DIAGNOSIS — L600 Ingrowing nail: Secondary | ICD-10-CM

## 2017-11-07 NOTE — Telephone Encounter (Signed)
Pt requesting referral for [facility] on [address]. In [place], [place]. For both big toes possible ingrown toe nail/infection Advise.

## 2017-11-07 NOTE — Telephone Encounter (Signed)
Pt called his insurance company and the first place he wanted the referral to go to is out of network. He would like to be referred to [facility]. Dr. Milinda Pointer is who he has looked into and Dr. Blenda Mounts. He is hoping the referral can be done as quick as possible so his foot doesn't get infected.

## 2017-11-07 NOTE — Telephone Encounter (Signed)
Pt states he was told he needed to have the ingrowns taken care of professionally, but was waiting on a referral from PCP, request what to do until seen in office. I instructed pt to soak twice daily in 1/4 cup epsom salt and 1 quart water for 20 minutes and cover with a light coated antibiotic ointment bandaid until seen in office. Pt states understanding.

## 2017-11-07 NOTE — Telephone Encounter (Signed)
Urgent referral placed to [facility]. Pt is aware and very thankful

## 2017-11-07 NOTE — Telephone Encounter (Signed)
Let's do 

## 2017-11-08 NOTE — Telephone Encounter (Signed)
Pt contacted and transferred up front to make an appt for this evening.

## 2017-11-08 NOTE — Telephone Encounter (Signed)
Please advise 

## 2017-11-08 NOTE — Telephone Encounter (Signed)
Pt has an appointment with [facility] on Friday. Pt was wondering if you had any suggestions he could do before listed appt date 11/10/2017. Advise.

## 2017-11-08 NOTE — Telephone Encounter (Signed)
We can see this aftermnoon

## 2017-11-09 ENCOUNTER — Ambulatory Visit (INDEPENDENT_AMBULATORY_CARE_PROVIDER_SITE_OTHER): Payer: No Typology Code available for payment source | Admitting: Family Medicine

## 2017-11-09 ENCOUNTER — Encounter: Payer: Self-pay | Admitting: Family Medicine

## 2017-11-09 VITALS — BP 102/74 | Temp 98.3°F | Ht 70.0 in | Wt 185.0 lb

## 2017-11-09 DIAGNOSIS — L600 Ingrowing nail: Secondary | ICD-10-CM

## 2017-11-09 MED ORDER — CEPHALEXIN 500 MG PO CAPS: 500.0000 mg | ORAL_CAPSULE | Freq: Four times a day (QID) | ORAL | 0 refills | Status: DC

## 2017-11-09 NOTE — Progress Notes (Signed)
   Subjective:    Patient ID: [name], male    DOB: [birth year], 42 y.o.   MRN: [id number]  HPI  Patient is here today with complaints of in grown toenails on both great toes. He states he has had this problem in the past and we have cut them out here.He has been using epsom salt soak,tylenol,open toe shoes. He has had a headache for the last two days.   Started flaring up four or fice  Days agi    No fever or chills  No fever or chills but somewhat mild headache    Review of Systems No headache, no major weight loss or weight gain, no chest pain no back pain abdominal pain no change in bowel habits complete ROS otherwise negative     Objective:   Physical Exam  Alert vitals stable, NAD. Blood pressure good on repeat. HEENT normal. Lungs clear. Heart regular rate and rhythm. Inflamed bilateral great toe lateral margin of the nail with tenderness redness swelling warmth      Assessment & Plan:  Impression bilateral ingrown toenails.  Plan antibiotics prescribed.  Local measures discussed.  Referral to podiatrist already set up.  Patient will likely need nailbed ablation discussed

## 2017-11-10 ENCOUNTER — Ambulatory Visit (INDEPENDENT_AMBULATORY_CARE_PROVIDER_SITE_OTHER): Payer: No Typology Code available for payment source

## 2017-11-10 ENCOUNTER — Encounter: Payer: Self-pay | Admitting: Podiatry

## 2017-11-10 ENCOUNTER — Ambulatory Visit: Payer: No Typology Code available for payment source | Admitting: Podiatry

## 2017-11-10 ENCOUNTER — Other Ambulatory Visit: Payer: Self-pay | Admitting: Podiatry

## 2017-11-10 ENCOUNTER — Encounter

## 2017-11-10 VITALS — BP 118/87 | HR 89 | Resp 16

## 2017-11-10 DIAGNOSIS — L600 Ingrowing nail: Secondary | ICD-10-CM

## 2017-11-10 DIAGNOSIS — M79671 Pain in right foot: Secondary | ICD-10-CM

## 2017-11-10 DIAGNOSIS — M2021 Hallux rigidus, right foot: Secondary | ICD-10-CM | POA: Diagnosis not present

## 2017-11-10 MED ORDER — NEOMYCIN-POLYMYXIN-HC 3.5-10000-1 OT SOLN: OTIC | 0 refills | Status: DC

## 2017-11-10 MED FILL — NEO/POLYMYXIN/HC EAR SOLN: 3.5-10000-1 | 25 days supply | Qty: 10 | Fill #0

## 2017-11-10 NOTE — Patient Instructions (Signed)

## 2017-11-10 NOTE — Progress Notes (Signed)
   Subjective:    Patient ID: [name], male    DOB: 02/15/1975, 42 y.o.   MRN: [id number]  HPI    Review of Systems  Respiratory: Positive for chest tightness.   All other systems reviewed and are negative.      Objective:   Physical Exam        Assessment & Plan:

## 2017-11-15 NOTE — Progress Notes (Signed)
Subjective:   Patient ID: [name], male   DOB: 42 y.o.   MRN: [id number]   HPI Patient presents stating he has had chronic ingrown toenails in his big toes and he has tried to trim them and soak them without relief and its gradually become more irritated for him.  Patient states that he is on Keflex right now and he has not noted any drainage and patient does not smoke and likes to be active   Review of Systems  All other systems reviewed and are negative.       Objective:  Physical Exam  Constitutional: He appears well-developed and well-nourished.  Cardiovascular: Intact distal pulses.  Pulmonary/Chest: Effort normal.  Musculoskeletal: Normal range of motion.  Neurological: He is alert.  Skin: Skin is warm.  Nursing note and vitals reviewed.   Neurovascular status intact muscle strength is adequate range of motion within normal limits with patient found to have incurvation of the hallux nail medial border bilateral with pain upon palpation no redness or drainage noted but deformity of the nailbed itself.  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  I allowed him to read consent form for correction going over the procedure and risk.  Patient wants surgery and today I infiltrated each hallux 60 mg Xylocaine Marcaine mixture remove the medial borders exposed matrix and applied phenol 3 applications 30 seconds followed by alcohol lavage and sterile dressing.  I prescribed Cortisporin otic solution explained leaving the dressing on for 24 hours but taking it off earlier if it should start to throb I encouraged him to call with any questions or concerns he may have during the healing process

## 2017-11-24 ENCOUNTER — Ambulatory Visit (INDEPENDENT_AMBULATORY_CARE_PROVIDER_SITE_OTHER): Payer: Self-pay

## 2017-11-24 DIAGNOSIS — L600 Ingrowing nail: Secondary | ICD-10-CM

## 2017-11-24 NOTE — Patient Instructions (Signed)

## 2017-11-28 NOTE — Progress Notes (Signed)
Patient is here today for follow-up appointment, recent procedure performed on 11/10/2017, removal of bilateral hallux nail borders.  He denies any complications with healing, and states that do not hurt at this time.  No redness, no erythema, no drainage, no swelling, no other signs and symptoms of infection.  The area appears to have scabbed over and is healing well.  Discussed signs and symptoms of infection.  Verbal and written instructions were given to the patient.  He is to follow-up as needed with any acute symptom changes.

## 2017-12-07 ENCOUNTER — Encounter: Payer: Self-pay | Admitting: Family Medicine

## 2017-12-07 ENCOUNTER — Ambulatory Visit (INDEPENDENT_AMBULATORY_CARE_PROVIDER_SITE_OTHER): Payer: No Typology Code available for payment source | Admitting: Family Medicine

## 2017-12-07 VITALS — BP 98/64 | Ht 70.0 in | Wt 189.0 lb

## 2017-12-07 DIAGNOSIS — I10 Essential (primary) hypertension: Secondary | ICD-10-CM | POA: Diagnosis not present

## 2017-12-07 DIAGNOSIS — G43009 Migraine without aura, not intractable, without status migrainosus: Secondary | ICD-10-CM | POA: Diagnosis not present

## 2017-12-07 DIAGNOSIS — G4733 Obstructive sleep apnea (adult) (pediatric): Secondary | ICD-10-CM

## 2017-12-07 MED ORDER — PROPRANOLOL HCL 20 MG PO TABS: 20.0000 mg | ORAL_TABLET | Freq: Two times a day (BID) | ORAL | 5 refills | Status: DC

## 2017-12-07 MED ORDER — LISINOPRIL 10 MG PO TABS: 10.0000 mg | ORAL_TABLET | Freq: Every day | ORAL | 1 refills | Status: DC

## 2017-12-07 MED ORDER — MOMETASONE FUROATE 0.1 % EX CREA: 1.0000 "application " | TOPICAL_CREAM | Freq: Two times a day (BID) | CUTANEOUS | 0 refills | Status: DC

## 2017-12-07 MED ORDER — ALBUTEROL SULFATE HFA 108 (90 BASE) MCG/ACT IN AERS: 2.0000 | INHALATION_SPRAY | Freq: Four times a day (QID) | RESPIRATORY_TRACT | 2 refills | Status: DC | PRN

## 2017-12-07 MED ORDER — TRIAMCINOLONE ACETONIDE 0.1 % EX CREA: 1.0000 "application " | TOPICAL_CREAM | Freq: Two times a day (BID) | CUTANEOUS | 2 refills | Status: DC

## 2017-12-07 MED ORDER — ELETRIPTAN HYDROBROMIDE 40 MG PO TABS: ORAL_TABLET | ORAL | 5 refills | Status: DC

## 2017-12-07 NOTE — Progress Notes (Signed)
   Subjective:    Patient ID: [name], male    DOB: Feb 12, 1975, 42 y.o.   MRN: [id number] Patient arrives with multiple concerns Hypertension  This is a chronic problem. Treatments tried: lisinopril 20mg  and propranolol 20mg . Compliance problems: takes meds every day, eats healthy,    Low back pain, history of kidney stones, pressure when urinating. Started a few day ago.   Has noticied he gets dizzy when he gets up too fast. Notes it ocuring when rises too fast, occurring frequently  Blood pressure medicine and blood pressure levels reviewed today with patient. Compliant with blood pressure medicine. States does not miss a dose. No obvious side effects. Blood pressure generally good when checked elsewhere. Watching salt intake.   BP overall ptety good migr headaches over btter and treating   Yawning leads to headaches  And migraines  Aura starrts with them     Results for orders placed or performed during the hospital encounter of 40/98/11  Basic metabolic panel  Result Value Ref Range   Sodium 141 135 - 145 mmol/L   Potassium 3.8 3.5 - 5.1 mmol/L   Chloride 104 101 - 111 mmol/L   CO2 28 22 - 32 mmol/L   Glucose, Bld 86 65 - 99 mg/dL   BUN 14 6 - 20 mg/dL   Creatinine, Ser 0.91 0.61 - 1.24 mg/dL   Calcium 9.8 8.9 - 10.3 mg/dL   GFR calc non Af Amer >60 >60 mL/min   GFR calc Af Amer >60 >60 mL/min   Anion gap 9 5 - 15  CBC  Result Value Ref Range   WBC 6.5 4.0 - 10.5 K/uL   RBC 5.38 4.22 - 5.81 MIL/uL   Hemoglobin 16.3 13.0 - 17.0 g/dL   HCT 46.6 39.0 - 52.0 %   MCV 86.6 78.0 - 100.0 fL   MCH 30.3 26.0 - 34.0 pg   MCHC 35.0 30.0 - 36.0 g/dL   RDW 12.2 11.5 - 15.5 %   Platelets 325 150 - 400 K/uL  Troponin I  Result Value Ref Range   Troponin I <0.03 <0.03 ng/mL  D-dimer, quantitative (not at [facility])  Result Value Ref Range   D-Dimer, Quant <0.27 0.00 - 0.50 ug/mL-FEU     Review of Systems No headache, no major weight loss or weight gain, no chest pain  no back pain abdominal pain no change in bowel habits complete ROS otherwise negative     Objective:   Physical Exam Alert and oriented, vitals reviewed and stable, NAD ENT-TM's and ext canals WNL bilat via otoscopic exam Soft palate, tonsils and post pharynx WNL via oropharyngeal exam Neck-symmetric, no masses; thyroid nonpalpable and nontender Pulmonary-no tachypnea or accessory muscle use; Clear without wheezes via auscultation Card--no abnrml murmurs, rhythm reg and rate WNL Carotid pulses symmetric, without bruits        Assessment & Plan:  Impression 1 dizziness.  On further history consistent with orthostatic hypotension.  Discussed.  This generally means blood pressure too strong.  Blood pressure is definitely in the low side today.  2.  Hypertension.  Excessive control discussed we will decrease lisinopril  3.  Chronic migraines.  Ongoing.  Discussed.  Ongoing need for medication.  Medicines refilled  4.  History of sleep apnea and importance of treating this discussed with patient  Follow-up in 6 months diet exercise discussed up-to-date on flu shot medications refilled wellness plus chronic pain

## 2017-12-11 ENCOUNTER — Telehealth: Payer: Self-pay | Admitting: Podiatry

## 2017-12-11 NOTE — Telephone Encounter (Signed)
Pt had a double ingrown toenail on both great toes and came in for nail check 11/24/2017 with the nurse but they are still tender needs to know what to do next? Possible anti-biotic? Please call patient.

## 2017-12-11 NOTE — Telephone Encounter (Signed)
I called pt and he states he has had increased redness and pain in the toenails especially the base. Pt states he has not performed the soaks in several weeks. I instructed pt to continue the soaks and cover with a bandaid to protect and transferred to scheduler for appt prior to the holiday weekend.

## 2017-12-12 ENCOUNTER — Ambulatory Visit (INDEPENDENT_AMBULATORY_CARE_PROVIDER_SITE_OTHER): Payer: Self-pay

## 2017-12-12 DIAGNOSIS — L600 Ingrowing nail: Secondary | ICD-10-CM

## 2017-12-12 MED ORDER — CEPHALEXIN 500 MG PO CAPS: 500.0000 mg | ORAL_CAPSULE | Freq: Three times a day (TID) | ORAL | 0 refills | Status: DC

## 2017-12-12 NOTE — Patient Instructions (Signed)

## 2017-12-18 NOTE — Progress Notes (Signed)
Patient is here today with concern about his left great nail.  Procedure performed 11/10/2017 removal of bilateral hallux nail borders.  He states that his left great nail is tender and painful at times.  Right great nail is free from signs and symptoms of infection the area is scabbing over and appears to be healing well.  Left great nail red at the base and tender to the touch.  No purulent drainage, no swelling.  Discussed findings with Dr. Carman Ching who prescribed Keflex 500 mg 3 times daily x7 days.  Discussed signs and symptoms of infection with patient, verbal written instructions were given.  He is to follow-up in 2 weeks if the area fails to improve.

## 2017-12-22 ENCOUNTER — Other Ambulatory Visit: Payer: No Typology Code available for payment source

## 2017-12-26 MED FILL — LISINOPRIL 20 MG TABLET: 20 | 90 days supply | Qty: 90 | Fill #0

## 2017-12-26 MED FILL — ELETRIPTAN HYDROBROMIDE 40: 40 | 30 days supply | Qty: 10 | Fill #2

## 2017-12-26 MED FILL — PROPRANOLOL 20 MG TABLET: 20 | 30 days supply | Qty: 60 | Fill #1

## 2018-01-15 ENCOUNTER — Telehealth: Payer: Self-pay | Admitting: Family Medicine

## 2018-01-15 ENCOUNTER — Ambulatory Visit (INDEPENDENT_AMBULATORY_CARE_PROVIDER_SITE_OTHER): Payer: No Typology Code available for payment source | Admitting: Family Medicine

## 2018-01-15 ENCOUNTER — Encounter: Payer: Self-pay | Admitting: Family Medicine

## 2018-01-15 VITALS — BP 130/86 | Temp 98.7°F | Wt 189.8 lb

## 2018-01-15 DIAGNOSIS — J111 Influenza due to unidentified influenza virus with other respiratory manifestations: Secondary | ICD-10-CM

## 2018-01-15 DIAGNOSIS — R300 Dysuria: Secondary | ICD-10-CM

## 2018-01-15 LAB — POCT URINALYSIS DIPSTICK
Spec Grav, UA: <=1.005 — AB (ref 1.010–1.025)
pH, UA: 6 (ref 5.0–8.0)

## 2018-01-15 MED ORDER — OSELTAMIVIR PHOSPHATE 75 MG PO CAPS: 75.0000 mg | ORAL_CAPSULE | Freq: Two times a day (BID) | ORAL | 0 refills | Status: AC

## 2018-01-15 NOTE — Telephone Encounter (Signed)
Patient is aware 

## 2018-01-15 NOTE — Progress Notes (Signed)
Subjective:    Patient ID: [name], male    DOB: [birth year], 42 y.o.   MRN: [id number]  Cough  This is a new problem. The current episode started in the past 7 days. The cough is non-productive. Associated symptoms include chills, ear pain, myalgias and a sore throat. Pertinent negatives include no fever, shortness of breath or wheezing. Associated symptoms comments: Aches, upset stomach, abdominal pain, pressure when urinating, little bit of burning. diarrhea. Treatments tried: Cough drops, sudafed, mucinex D. The treatment provided mild relief.   Pt states that wife was diagnosed with bronchitis yesterday and he is having a lot of same symptoms.   Yesterday started around 7 pm with not feeling very well, then increased cough and body aches and sore throat. No fever, mild chills. Denies h/a. Reports stomach feels upset today, no N/V/D, stomach just feels uneasy. Also reports burning with urination and pressure starting today as well. No hematuria.   Review of Systems  Constitutional: Positive for chills. Negative for fever.  HENT: Positive for ear pain and sore throat.   Respiratory: Positive for cough. Negative for shortness of breath and wheezing.   Genitourinary: Positive for dysuria.  Musculoskeletal: Positive for myalgias.       Objective:   Physical Exam Vitals signs and nursing note reviewed.  Constitutional:      General: He is not in acute distress.    Appearance: Normal appearance. He is not toxic-appearing.  HENT:     Head: Normocephalic and atraumatic.     Right Ear: Tympanic membrane normal.     Left Ear: Tympanic membrane normal.     Nose: Congestion present.     Mouth/Throat:     Mouth: Mucous membranes are moist.     Pharynx: Oropharynx is clear.  Eyes:     General:        Right eye: No discharge.        Left eye: No discharge.  Neck:     Musculoskeletal: Neck supple. No neck rigidity.  Cardiovascular:     Rate and Rhythm: Normal rate and regular  rhythm.     Heart sounds: Normal heart sounds.  Pulmonary:     Effort: Pulmonary effort is normal. No respiratory distress.     Breath sounds: Normal breath sounds.  Abdominal:     General: Bowel sounds are normal. There is no distension.     Palpations: Abdomen is soft. There is no mass.     Tenderness: There is no abdominal tenderness.  Lymphadenopathy:     Cervical: No cervical adenopathy.  Skin:    General: Skin is warm and dry.  Neurological:     Mental Status: He is alert and oriented to person, place, and time.    Results for orders placed or performed in visit on 01/15/18  POCT Urinalysis Dipstick  Result Value Ref Range   Color, UA     Clarity, UA     Glucose, UA     Bilirubin, UA     Ketones, UA     Spec Grav, UA <=1.005 (A) 1.010 - 1.025   Blood, UA     pH, UA 6.0 5.0 - 8.0   Protein, UA     Urobilinogen, UA     Nitrite, UA     Leukocytes, UA     Appearance     Odor     Urine microscopic, no evidence of WBC or RBC noted.       Assessment &  Plan:  1. Influenza Discussed likely influenza.  Symptomatic care discussed.  Warning signs discussed.  Will treat with Tamiflu.  Follow-up if symptoms worsen or fail to improve  2. Dysuria - Plan: POCT Urinalysis Dipstick Urine is negative for infection.  No need for urine culture.  Dysuria likely the result of concentrated urine and inflammation related to his flu diagnosis.  Follow-up if symptoms worsen or fail to improve.

## 2018-01-15 NOTE — Patient Instructions (Signed)

## 2018-01-15 NOTE — Telephone Encounter (Signed)
Please let patient know that urine he dropped off is negative for infection, if he continues to have problems he should f/u but most likely r/t his flu diagnosis. Thanks!

## 2018-01-17 IMAGING — CT CT L SPINE W/O CM
3 of 4 series · 11 of 33 positions shown, 13 images · non-contrast
Comparison: 01/30/2014 lumbar spine MRI

CLINICAL DATA: Shooting pain into legs and groin after bending to
assist a patient.

EXAM:
CT LUMBAR SPINE WITHOUT CONTRAST
TECHNIQUE: Multidetector CT imaging of the lumbar spine was performed without
intravenous contrast administration. Multiplanar CT image
reconstructions were also generated.

[Series 4: l spine soft · axial · 0.36mm/px · z∈[-278,-96]mm · 3 of 160 slices shown, 4 images]
[im 46/160  soft-tissue]
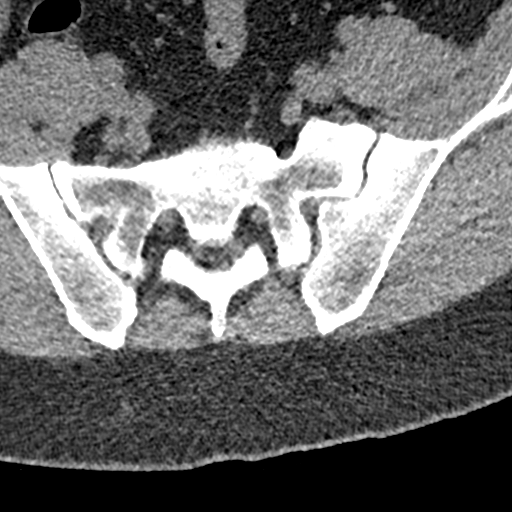
[im 46/160  bone]
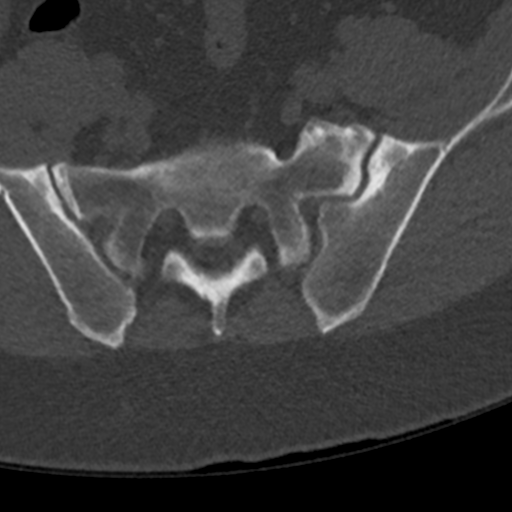
[im 91/160  bone]
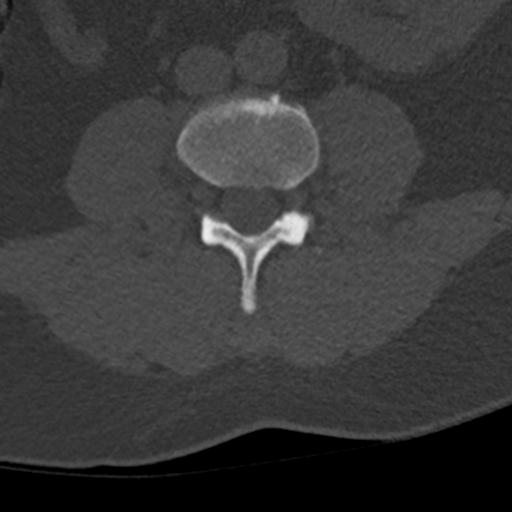
[im 137/160  bone]
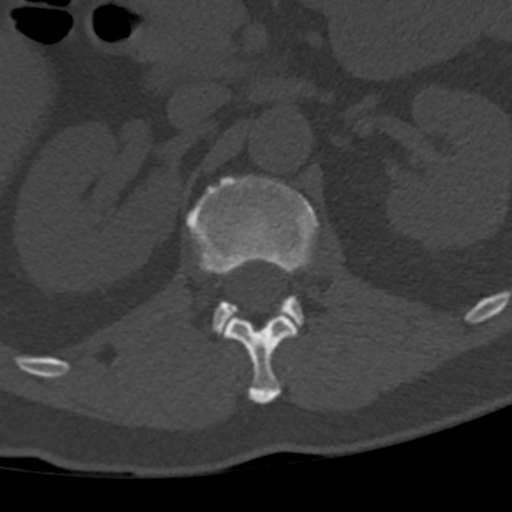

[Series 5: sagittal bone · sagittal · 0.36mm/px · 5 of 100 slices shown, 6 images]
[im 34/100  bone]
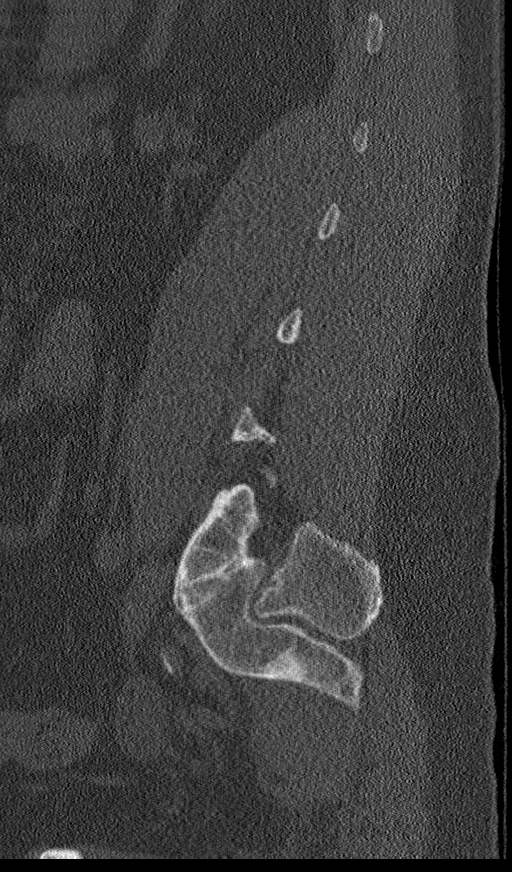
[im 42/100  bone]
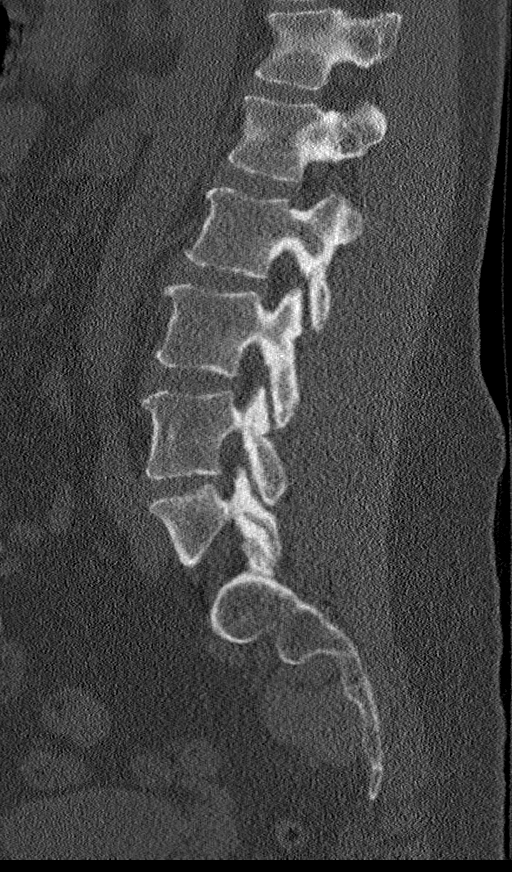
[im 50/100  soft-tissue]
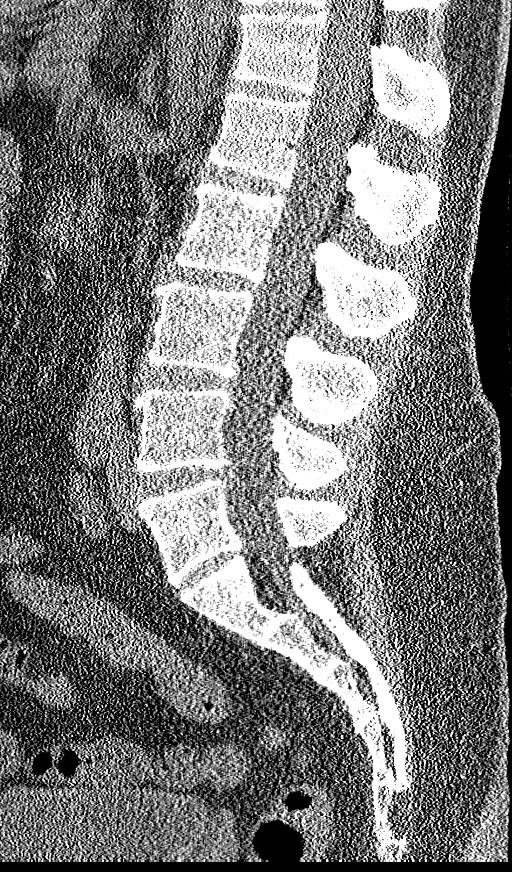
[im 50/100  bone]
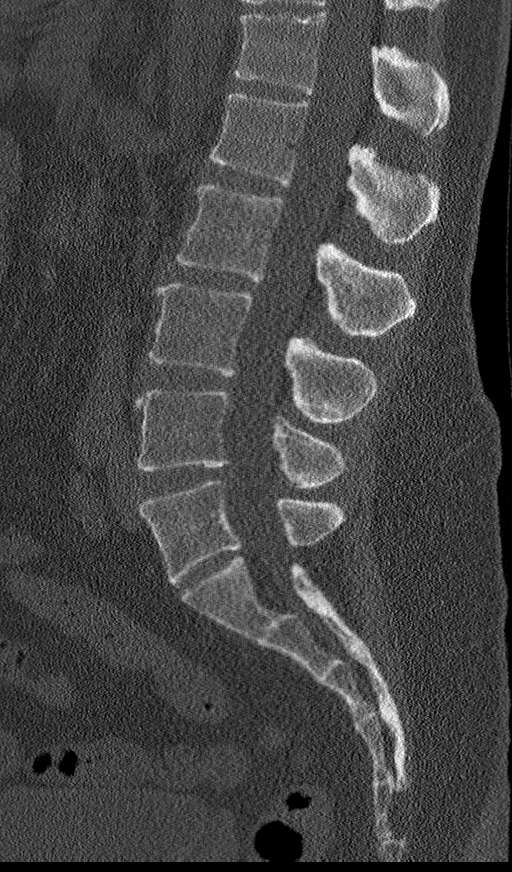
[im 58/100  bone]
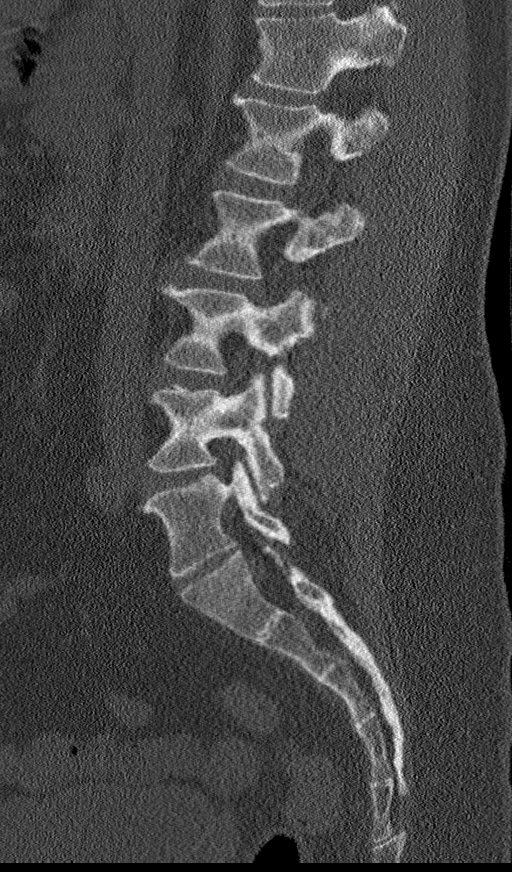
[im 67/100  bone]
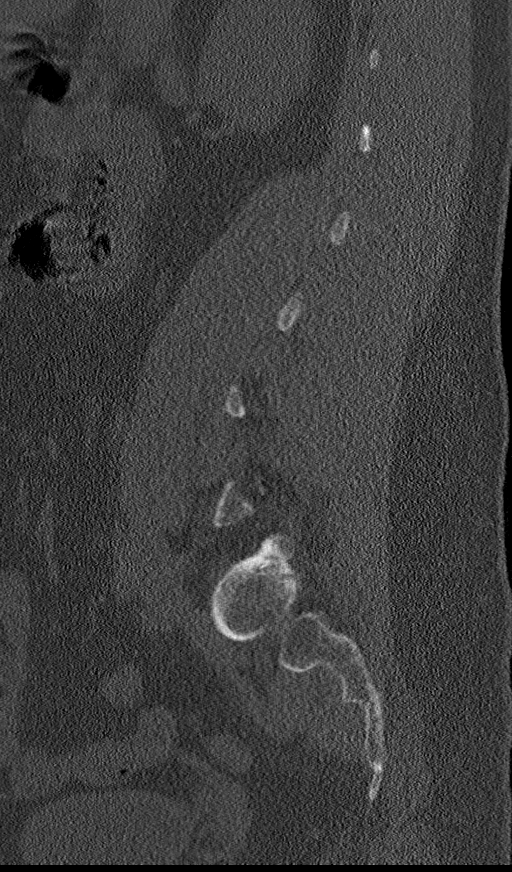

[Series 6: coronal bone · coronal · 0.48mm/px · 3 of 94 slices shown]
[im 19/94  bone]
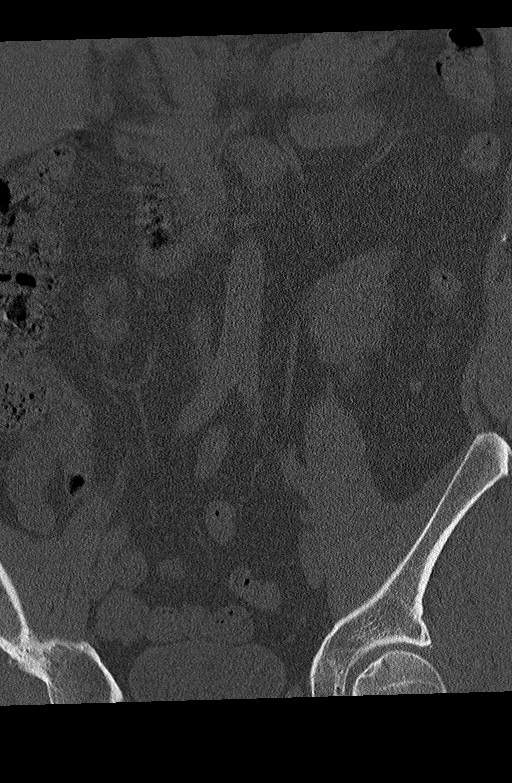
[im 38/94  bone]
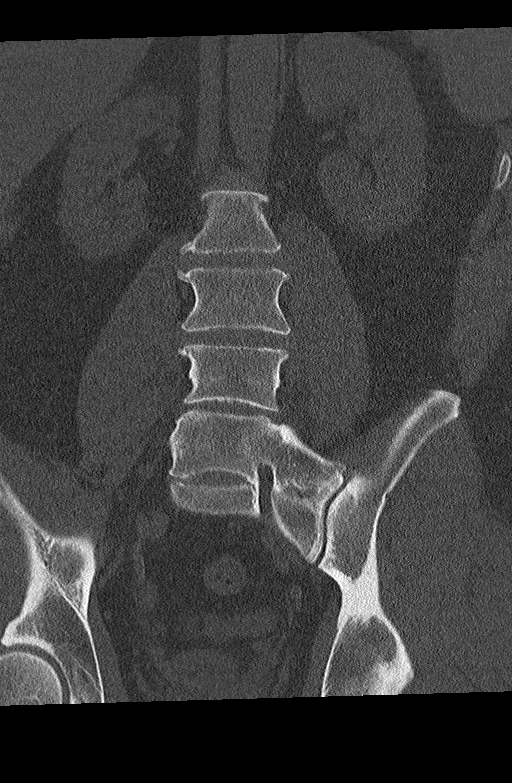
[im 56/94  bone]
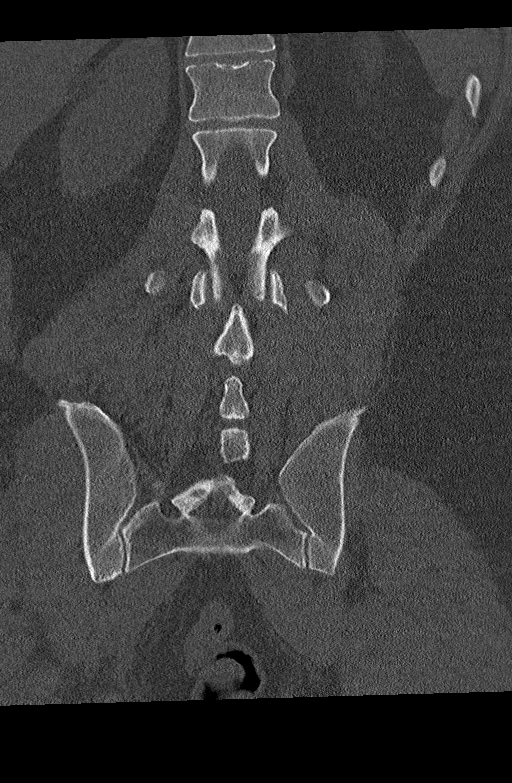

[11 of 33 positions shown; findings below may reference images not displayed]

FINDINGS: Segmentation: Spine labeling as per 01/30/2014 MRI. There is
lumbosacral transitional anatomy with left assimilation joint at the
lowest this level labeled L5-S1.

Alignment: Normal

Vertebrae: Small Schmorl's node along the superior endplate of T12
is unchanged. No fracture, spondylolysis nor spondylolisthesis.

Paraspinal and other soft tissues: No retroperitoneal adenopathy. No
aortic aneurysm. No nephrolithiasis.

Disc levels:

T11-T12: No focal disc herniation, canal stenosis nor neural
foraminal encroachment.

T12-L1: No focal disc herniation, canal stenosis nor neural
foraminal encroachment.

L1-L2: No focal disc herniation, canal stenosis nor neural foraminal
encroachment.

L2-L3: No focal disc herniation, canal stenosis nor neural foraminal
encroachment.

L3-L4: Minimal concentric disc bulge without focal disc herniation,
canal stenosis or significant neural foraminal encroachment.

L4-L5: Stable small central to right paracentral disc herniation
impressing upon the thecal sac. No significant canal stenosis nor
neural foraminal encroachment.

L5-S1: No focal disc herniation, canal stenosis nor neural foraminal
encroachment.
IMPRESSION: Chronic stable small central to right paracentral disc protrusion at
L4-5. No acute osseous abnormality.

## 2018-01-23 ENCOUNTER — Telehealth: Payer: Self-pay | Admitting: Family Medicine

## 2018-01-23 MED ORDER — BENZONATATE 100 MG PO CAPS: 100.0000 mg | ORAL_CAPSULE | Freq: Three times a day (TID) | ORAL | 0 refills | Status: DC | PRN

## 2018-01-23 NOTE — Telephone Encounter (Signed)
Tess perle 100 mg 30 one tid prn cough 

## 2018-01-23 NOTE — Telephone Encounter (Signed)
Pt states at [place] visit 01/15/2018 with Ria Comment he's was diagnosed with the flu, pt states he feels overall better other than a deep cough, to the point were his chest is sore from coughing. Pt would like to know if there is any kind of medication he could have prescribed for his cough. Advise.    Pharmacy:  [facility] [place], [place]

## 2018-01-23 NOTE — Telephone Encounter (Signed)
No fever, no sob. Feeling better. Having a deep cough. Chest sore from coughing. Would like a cough med he could take during the day while he is working.

## 2018-01-23 NOTE — Telephone Encounter (Signed)
Patient is aware medication sent to request drug store.

## 2018-02-02 ENCOUNTER — Telehealth: Payer: Self-pay | Admitting: Family Medicine

## 2018-02-02 NOTE — Telephone Encounter (Signed)
Patient had FMLA faxed over to be updated for this year. Please return form, date and sign in your yellow folder.

## 2018-02-02 NOTE — Telephone Encounter (Signed)
Form in provider office. Please advise. Thank you. 

## 2018-02-05 NOTE — Telephone Encounter (Signed)
Forms are completed and faxed to [employer] this morning.

## 2018-02-09 MED FILL — ELETRIPTAN HYDROBROMIDE 40: 40 | 30 days supply | Qty: 10 | Fill #0

## 2018-03-19 ENCOUNTER — Encounter: Payer: Self-pay | Admitting: Family Medicine

## 2018-03-19 ENCOUNTER — Ambulatory Visit (INDEPENDENT_AMBULATORY_CARE_PROVIDER_SITE_OTHER): Payer: No Typology Code available for payment source | Admitting: Family Medicine

## 2018-03-19 VITALS — BP 158/92 | Temp 98.2°F | Ht 70.0 in | Wt 187.0 lb

## 2018-03-19 DIAGNOSIS — R300 Dysuria: Secondary | ICD-10-CM

## 2018-03-19 LAB — POCT URINALYSIS DIPSTICK
Spec Grav, UA: 1.025 (ref 1.010–1.025)
pH, UA: 5 (ref 5.0–8.0)

## 2018-03-19 MED ORDER — TIZANIDINE HCL 4 MG PO CAPS: 4.0000 mg | ORAL_CAPSULE | Freq: Three times a day (TID) | ORAL | 0 refills | Status: DC | PRN

## 2018-03-19 MED ORDER — ONDANSETRON 4 MG PO TBDP: 4.0000 mg | ORAL_TABLET | Freq: Three times a day (TID) | ORAL | 0 refills | Status: DC | PRN

## 2018-03-19 MED ORDER — ETODOLAC 400 MG PO TABS: 400.0000 mg | ORAL_TABLET | Freq: Two times a day (BID) | ORAL | 0 refills | Status: DC

## 2018-03-19 MED ORDER — HYDROCODONE-ACETAMINOPHEN 5-325 MG PO TABS: ORAL_TABLET | ORAL | 0 refills | Status: DC

## 2018-03-19 NOTE — Progress Notes (Signed)
   Subjective:    Patient ID: [name], male    DOB: 25-Apr-1975, 43 y.o.   MRN: [id number] Patient arrives office Back Pain  This is a new problem. Episode onset: 2 days.   Patient also having abdominal pain and painful urination.  Results for orders placed or performed in visit on 03/19/18  POCT urinalysis dipstick  Result Value Ref Range   Color, UA     Clarity, UA     Glucose, UA     Bilirubin, UA     Ketones, UA     Spec Grav, UA 1.025 1.010 - 1.025   Blood, UA     pH, UA 5.0 5.0 - 8.0   Protein, UA     Urobilinogen, UA     Nitrite, UA     Leukocytes, UA     Appearance     Odor     Low back pain fairlu severe   Feeling some chills at times  Feels achey   Appetite ok but not great  Notes pressure with urintion, pin not bad,,urine a bit concentrated  Sat went to movies , was sitting and noticed pain spasms in the back  occas certain movements can trigger the sig pain and spasmnd some cleaning around the house    Pain can be very bad, gripping,     Review of Systems  Musculoskeletal: Positive for back pain.       Objective:   Physical Exam  Alert and oriented, vitals reviewed and stable, NAD ENT-TM's and ext canals WNL bilat via otoscopic exam Soft palate, tonsils and post pharynx WNL via oropharyngeal exam Neck-symmetric, no masses; thyroid nonpalpable and nontender Pulmonary-no tachypnea or accessory muscle use; Clear without wheezes via auscultation Card--no abnrml murmurs, rhythm reg and rate WNL Carotid pulses symmetric, without bruits No true CVA tenderness negative straight leg raise.  Positive left lower lumbar tenderness to deep palpation abdomen excellent bowel sounds no discrete tenderness groin normal.  Prostate within normal limits  Urinalysis no red blood cells no white blood cells      Assessment & Plan:  Impression severe incapacitating back pain.  Patient was concerned about kidney stone versus prostatitis versus kidney  infection I think none of these things are going on.  High likelihood this is muscle strain with a tendon spasm.  Discussed.  Pain is severe in nature.  Will prescribe anti-inflammatory and antispasmodic to severe intermittent spasms along with pain medicine local measures discussed expect improvement.Greater than 50% of this 25 minute face to face visit was spent in counseling and discussion and coordination of care regarding the above diagnosis/diagnosies

## 2018-03-27 ENCOUNTER — Encounter: Payer: Self-pay | Admitting: Family Medicine

## 2018-03-27 ENCOUNTER — Telehealth: Payer: Self-pay | Admitting: Family Medicine

## 2018-03-27 ENCOUNTER — Ambulatory Visit (INDEPENDENT_AMBULATORY_CARE_PROVIDER_SITE_OTHER): Payer: No Typology Code available for payment source | Admitting: Family Medicine

## 2018-03-27 VITALS — BP 128/86 | Temp 98.4°F | Ht 70.0 in | Wt 185.0 lb

## 2018-03-27 DIAGNOSIS — N451 Epididymitis: Secondary | ICD-10-CM | POA: Diagnosis not present

## 2018-03-27 MED ORDER — DOXYCYCLINE HYCLATE 100 MG PO TABS: 100.0000 mg | ORAL_TABLET | Freq: Two times a day (BID) | ORAL | 0 refills | Status: DC

## 2018-03-27 NOTE — Progress Notes (Signed)
   Subjective:    Patient ID: [name], male    DOB: Aug 07, 1975, 43 y.o.   MRN: [id number]  HPIleft side groin pain. Started last week. Taking steroid, pain med and muscle relaxer. Tried heat and ice.   Pt having ongoing pain and dsiscomfort and squeazzing sensatio    Mostly on the left side   Pt has held off on work due to pain   Had a migraione yesterday   Pt still taking the aniinflam med and the  Muscle spasm agent   Still taking occas hydrocod   zanaflex causing a drugged feeling   Worked towards the end of last week      Review of Systems No headache, no major weight loss or weight gain, no chest pain no back pain abdominal pain no change in bowel habits complete ROS otherwise negative     Objective:   Physical Exam Alert vitals stable, NAD. Blood pressure good on repeat. HEENT normal. Lungs clear. Heart regular rate and rhythm. No palpable hernia left posterior testicle exquisitely tender in the region of the epididymis       Assessment & Plan:  Impression acute epididymitis.  Discussed.  Antibiotics prescribed.  Local measures discussed.

## 2018-03-27 NOTE — Patient Instructions (Signed)
Epididymitis    Epididymitis is swelling (inflammation) of the epididymis. The epididymis is a cord-like structure that is located along the top and back part of the testicle. It collects and stores sperm from the testicle.  This condition can also cause pain and swelling of the testicle and scrotum. Symptoms usually start suddenly (acute epididymitis). Sometimes epididymitis starts gradually and lasts for a while (chronic epididymitis). This type may be harder to treat.  What are the causes?  In men 35 and younger, this condition is usually caused by a bacterial infection or sexually transmitted disease (STD), such as:   Gonorrhea.   Chlamydia.  In men 35 and older who do not have anal sex, this condition is usually caused by bacteria from a blockage or abnormalities in the urinary system. These can result from:   Having a tube placed into the bladder (urinary catheter).   Having an enlarged or inflamed prostate gland.   Having recent urinary tract surgery.  In men who have a condition that weakens the body's defense system (immune system), such as HIV, this condition can be caused by:   Other bacteria, including tuberculosis and syphilis.   Viruses.   Fungi.  Sometimes this condition occurs without infection. That may happen if urine flows backward into the epididymis after heavy lifting or straining.  What increases the risk?  This condition is more likely to develop in men:   Who have unprotected sex with more than one partner.   Who have anal sex.   Who have recently had surgery.   Who have a urinary catheter.   Who have urinary problems.   Who have a suppressed immune system.  What are the signs or symptoms?  This condition usually begins suddenly with chills, fever, and pain behind the scrotum and in the testicle. Other symptoms include:   Swelling of the scrotum, testicle, or both.   Pain whenejaculatingor urinating.   Pain in the back or belly.   Nausea.   Itching and discharge from the  penis.   Frequent need to pass urine.   Redness and tenderness of the scrotum.  How is this diagnosed?  Your health care provider can diagnose this condition based on your symptoms and medical history. Your health care provider will also do a physical exam to ask about your symptoms and check your scrotum and testicle for swelling, pain, and redness. You may also have other tests, including:   Examination of discharge from the penis.   Urine tests for infections, such as STDs.  Your health care provider may test you for other STDs, including HIV.  How is this treated?  Treatment for this condition depends on the cause. If your condition is caused by a bacterial infection, oral antibiotic medicine may be prescribed. If the bacterial infection has spread to your blood, you may need to receive IV antibiotics. Nonbacterial epididymitis is treated with home care that includes bed rest and elevation of the scrotum.  Surgery may be needed to treat:   Bacterial epididymitis that causes pus to build up in the scrotum (abscess).   Chronic epididymitis that has not responded to other treatments.  Follow these instructions at home:  Medicines   Take over-the-counter and prescription medicines only as told by your health care provider.   If you were prescribed an antibiotic medicine, take it as told by your health care provider. Do not stop taking the antibiotic even if your condition improves.  Sexual Activity   If your   epididymitis was caused by an STD, avoid sexual activity until your treatment is complete.   Inform your sexual partner or partners if you test positive for an STD. They may need to be treated.Do not engage in sexual activity with your partner or partners until their treatment is completed.  General instructions   Return to your normal activities as told by your health care provider. Ask your health care provider what activities are safe for you.   Keep your scrotum elevated and supported while  resting. Ask your health care provider if you should wear a scrotal support, such as a jockstrap. Wear it as told by your health care provider.   If directed, apply ice to the affected area:  ? Put ice in a plastic bag.  ? Place a towel between your skin and the bag.  ? Leave the ice on for 20 minutes, 2-3 times per day.   Try taking a sitz bath to help with discomfort. This is a warm water bath that is taken while you are sitting down. The water should only come up to your hips and should cover your buttocks. Do this 3-4 times per day or as told by your health care provider.   Keep all follow-up visits as told by your health care provider. This is important.  Contact a health care provider if:   You have a fever.   Your pain medicine is not helping.   Your pain is getting worse.   Your symptoms do not improve within three days.  This information is not intended to replace advice given to you by your health care provider. Make sure you discuss any questions you have with your health care provider.  Document Released: 01/01/2000 Document Revised: 06/11/2015 Document Reviewed: 05/21/2014  [employer]vier Interactive Patient Education  2019 [employer]

## 2018-03-29 ENCOUNTER — Telehealth: Payer: Self-pay | Admitting: *Deleted

## 2018-03-29 ENCOUNTER — Other Ambulatory Visit: Payer: Self-pay | Admitting: Family Medicine

## 2018-03-29 ENCOUNTER — Telehealth: Payer: Self-pay | Admitting: Family Medicine

## 2018-03-29 MED ORDER — OXYCODONE-ACETAMINOPHEN 5-325 MG PO TABS: 1.0000 | ORAL_TABLET | Freq: Four times a day (QID) | ORAL | 0 refills | Status: DC | PRN

## 2018-03-29 NOTE — Telephone Encounter (Signed)
Pt was notified med sent and message sent to front to get work excuse ready for sunday

## 2018-03-29 NOTE — Telephone Encounter (Signed)
[facility] [address] and also wants work excuse for Sunday.

## 2018-03-29 NOTE — Telephone Encounter (Signed)
I can send in a short course of Percocet, 5 mg / 325 mg, #20, 1 4 times daily as needed pain Federal law does not allow the need to send in a large amount Use only when necessary when at home Caution drowsiness Stop hydrocodone If back is not doing better by early next week recommend consideration for follow-up Stretching exercises Do not drive while taking medicine Please verify with patient where he wants medicine sent

## 2018-03-29 NOTE — Telephone Encounter (Signed)
Pt seen 3/2 and 3/10 for groin and back pain. Today felt lke he was going to pass out in the shower due to the pain. Pt states squeezing pain in his back like something squeezing all his nerves. Taking etodolac and one half tablet of the zanaflex. He has tried ice pack and heating pad. Has three hyrdocodone left and he is milking them so he doesn't run out and did not take today because he has to pick up children from school. Suppose to go back to work on Sunday and he wants to go but doesn't see being well enough to go. Would like work excuse and to see if he can do anything else for pain.   [facility] [address]

## 2018-03-30 ENCOUNTER — Telehealth: Payer: Self-pay | Admitting: Family Medicine

## 2018-03-30 ENCOUNTER — Encounter: Payer: Self-pay | Admitting: Family Medicine

## 2018-03-30 DIAGNOSIS — R1030 Lower abdominal pain, unspecified: Secondary | ICD-10-CM

## 2018-03-30 NOTE — Telephone Encounter (Signed)
Referral ordered in EPIC. 

## 2018-03-30 NOTE — Telephone Encounter (Signed)
[facility] in [place] ref either dr Jeffie Pollock or dr Diona Fanti

## 2018-03-30 NOTE — Telephone Encounter (Signed)
Work note ready and pt notified

## 2018-03-30 NOTE — Telephone Encounter (Signed)
Patient would like referral to Urology for groin pain on (L) side also patient doesn't know name of urologist but preferred one that comes to [place] from [place] a couple days a week, patient would also like to inform Dr.Steve he has appt with chiropractor for his back. Advise.

## 2018-04-02 ENCOUNTER — Other Ambulatory Visit: Payer: Self-pay | Admitting: Family Medicine

## 2018-04-02 ENCOUNTER — Telehealth: Payer: Self-pay | Admitting: Family Medicine

## 2018-04-02 MED FILL — PROPRANOLOL 20 MG TABLET: 20 | 30 days supply | Qty: 60 | Fill #2

## 2018-04-02 MED FILL — ELETRIPTAN HBR 40 MG TABLET: 40 | 30 days supply | Qty: 10 | Fill #1

## 2018-04-02 MED FILL — LISINOPRIL 20 MG TABLET: 20 | 90 days supply | Qty: 90 | Fill #1

## 2018-04-02 NOTE — Telephone Encounter (Signed)
sure

## 2018-04-02 NOTE — Telephone Encounter (Signed)
Pt went and saw chiropractor today and had tims unit and ice done. He states he has gotten worse in both areas. And is requesting an extension to his work note through this week.   Pt did have chiropractor office fax over [place] notes from today's visit.

## 2018-04-03 ENCOUNTER — Encounter: Payer: Self-pay | Admitting: Family Medicine

## 2018-04-04 ENCOUNTER — Encounter: Payer: Self-pay | Admitting: Family Medicine

## 2018-04-05 ENCOUNTER — Telehealth: Payer: Self-pay | Admitting: Family Medicine

## 2018-04-05 NOTE — Telephone Encounter (Signed)
Patient had short-term disability sent over to be filled out.I filled in what I could.Please fill in highlighted areas in your yellow folder.

## 2018-04-06 NOTE — Telephone Encounter (Signed)
error 

## 2018-04-11 ENCOUNTER — Telehealth: Payer: Self-pay | Admitting: Family Medicine

## 2018-04-11 NOTE — Telephone Encounter (Signed)
Patient states he seen urologist Monday and Chiropractor yesterday patient states neither found causes of pain in groin/abd, patient states both providers asked patient if he's ever been checked for possible inguinal hernia, advise.

## 2018-04-11 NOTE — Telephone Encounter (Signed)
Face to face appt with me next week

## 2018-04-11 NOTE — Telephone Encounter (Signed)
Urologists are very capable of discovering a hernia when they do their exam in the scrotum; I certauinly did not feel one, pain on exam was distinctly at the back side of his testicle,

## 2018-04-11 NOTE — Telephone Encounter (Signed)
Please advise 

## 2018-04-11 NOTE — Telephone Encounter (Signed)
Patient states the urologist didn't do a full exam because his urine was clear but after he urinated at urologist he alt of pain and the urologist said it could be his nerves in his back and then the back doctor said it could be a hernia and he should be checked. Yesterday he also started having some stomach pain in lower stomach and the chiropractor said that that could be a hernia.

## 2018-04-11 NOTE — Telephone Encounter (Signed)
Patient is aware of all and was transferred up front to set up an appt for an am appointment.

## 2018-04-11 NOTE — Telephone Encounter (Signed)
Patient called to check on Short-term disability form was put in your folder 04/05/18 needing by 3/27 to be faxed in with records.

## 2018-04-12 ENCOUNTER — Telehealth: Payer: Self-pay | Admitting: Family Medicine

## 2018-04-12 NOTE — Telephone Encounter (Signed)
Patient is saying that the specialist he is seeing said he needs an MRI but that his PCP needs to order it because "PCP's are able to get the approval better with the insurance companies".  He wanted to see if we would order an MRI ( or [facility]).  He is trying to get this all done because he is due to go back to work in the near future.  He has an appt with [place] on Monday but wanted to check on this sooner.

## 2018-04-12 NOTE — Telephone Encounter (Signed)
Contacted patient to get more information. Pt is needing a Lumbar Spine CT. He has been seeing Dr. Lovena Le chiropractor for 2 weeks. Pt has been having numbness, tingling, and weakness in left leg. Pt is in constant pain. Pt states that Dr.Taylor said PCP may be able to get it approved faster than he could. Please advise. Thank you. Pt is aware that Dr.Steve is out until tomorrow. Pt verbalized understood.

## 2018-04-13 DIAGNOSIS — Z029 Encounter for administrative examinations, unspecified: Secondary | ICD-10-CM

## 2018-04-13 NOTE — Telephone Encounter (Signed)
Form is completed and faxed over to [employer]. A copy is up front for patient to pick up.

## 2018-04-13 NOTE — Telephone Encounter (Signed)
Tell him I am confused, just yesterday dhe told me needs eval for hernia how does lumbar ct do that?   We will disc lumb ct at next weeks visit

## 2018-04-13 NOTE — Telephone Encounter (Signed)
Pt contacted and informed that all would be discussed at office visit. Pt states that it was originally a phone visit,, spoke with provider and he wants this to be an office visit. Pt advised and verbalized understanding.

## 2018-04-13 NOTE — Telephone Encounter (Signed)
Pt needing MRI not CT. Pt states chiropractor is needing an MRI done sooner rather than later. Informed pt that the MRI may be hard to get scheduled with COVID going on. Pt states that chiropractor said PCP may be able  and get this approved quicker.He knows it takes a while to get everything scheduled, and get the results back. Pt is having numbness, weakness and tingling in legs and feet. Please advise. Thank you

## 2018-04-13 NOTE — Telephone Encounter (Signed)
Left message to return call 

## 2018-04-13 NOTE — Telephone Encounter (Signed)
We will discuss ALL at his visit next week, we cannot disc this cover the phone and expect insur co to cover

## 2018-04-16 ENCOUNTER — Ambulatory Visit (INDEPENDENT_AMBULATORY_CARE_PROVIDER_SITE_OTHER): Payer: No Typology Code available for payment source | Admitting: Family Medicine

## 2018-04-16 ENCOUNTER — Other Ambulatory Visit: Payer: Self-pay

## 2018-04-16 ENCOUNTER — Encounter: Payer: Self-pay | Admitting: Family Medicine

## 2018-04-16 VITALS — BP 132/82 | Temp 98.2°F | Wt 182.8 lb

## 2018-04-16 DIAGNOSIS — M5432 Sciatica, left side: Secondary | ICD-10-CM

## 2018-04-16 DIAGNOSIS — M79605 Pain in left leg: Secondary | ICD-10-CM

## 2018-04-16 MED ORDER — HYDROCODONE-ACETAMINOPHEN 5-325 MG PO TABS: ORAL_TABLET | ORAL | 0 refills | Status: DC

## 2018-04-16 NOTE — Progress Notes (Signed)
   Subjective:    Patient ID: [name], male    DOB: 1975/06/29, 43 y.o.   MRN: [id number]  HPI Pt here today for discussion about groin pain. Pt states he has been using lots of ice in groin area. Certain movement does aggravate pain.     Pt is also having lower back pain. Pt has been seeing a Restaurant manager, fast food. Pt is having sharp stabbing pain on left side going down to left leg. Pt having numbness in left big toe and toe next to big toe.    Pt states chiropractor believes all the pain is related and pt would benefit from MRI.  Still having shooting pains in grpin  mstly on left side  Also shooting pains in the buttock   gfeeling pain into the leg and foot and in  Numbness into the great and second to  The uro felt urine fine  No evid infxn  Felt pt had likely disc or nerve pain   uro did scrotal exa  Still tender??  Sore all the time,   chiro doc raised quest of hernia   Unable to work due to pain an discomfort    Using ibu and tyl trying to hold off on pain meds ; Review of Systems No headache, no major weight loss or weight gain, no chest pain no back pain abdominal pain no change in bowel habits complete ROS otherwise negative     Objective:   Physical Exam  Alert and oriented, vitals reviewed and stable, NAD ENT-TM's and ext canals WNL bilat via otoscopic exam Soft palate, tonsils and post pharynx WNL via oropharyngeal exam Neck-symmetric, no masses; thyroid nonpalpable and nontender Pulmonary-no tachypnea or accessory muscle use; Clear without wheezes via auscultation Card--no abnrml murmurs, rhythm reg and rate WNL Carotid pulses symmetric, without bruits Diminished ankle reflex weak with leg extension sensation diminished in her foot strongly positive straight leg raise strongly positive sciatic notch tenderness      Assessment & Plan:  Impression severe sciatica.  For the past month.  Unresponsive to chiropractic and conservative therapy.  No  prednisone today due to COVID-19 threat and not wanting to suppress immune system rationale discussed.  MRI ordered pain medication written.  Work excuse also

## 2018-04-23 ENCOUNTER — Other Ambulatory Visit: Payer: Self-pay

## 2018-04-23 ENCOUNTER — Telehealth: Payer: Self-pay | Admitting: Family Medicine

## 2018-04-23 ENCOUNTER — Other Ambulatory Visit: Payer: Self-pay | Admitting: Family Medicine

## 2018-04-23 ENCOUNTER — Ambulatory Visit (HOSPITAL_COMMUNITY)
Admission: RE | Admit: 2018-04-23 | Discharge: 2018-04-23 | Disposition: A | Discharge status: Home / Self Care | Payer: No Typology Code available for payment source | Source: Ambulatory Visit | Attending: Family Medicine | Admitting: Family Medicine

## 2018-04-23 DIAGNOSIS — M5432 Sciatica, left side: Secondary | ICD-10-CM | POA: Diagnosis present

## 2018-04-23 DIAGNOSIS — M545 Low back pain, unspecified: Secondary | ICD-10-CM

## 2018-04-23 DIAGNOSIS — M79605 Pain in left leg: Secondary | ICD-10-CM

## 2018-04-23 NOTE — Telephone Encounter (Signed)
Pain has been worse. Went to specialist on Thursday. Now is worse than it worse. Go back to specialist at 2 pm today. Maybe get an injection while waiting for MRI. Dr.Basset at [facility] informed patient to maybe have an injection. Pt would have to go to [place]. Occasional numbness, tingling in left leg. Once in a while his right leg goes numb, pain and weakness. Has not loss mobility in walking. Numbness/tingling/pain does go to feet (big toe and toe next to big toe) groin, low back toward left side. Has had headache and nausea occasionally. Frequent urination happened a little bit but it better now. Pt has seen urologist.

## 2018-04-23 NOTE — Telephone Encounter (Signed)
MRI placed as STAT

## 2018-04-23 NOTE — Telephone Encounter (Signed)
Nurse call pt, get a very complete hx of all symtoms   Call radiology and ask if they consider an mri of the lumbar spine for someone with very painful sciatic as appropriate for "stat"?

## 2018-04-23 NOTE — Telephone Encounter (Signed)
Pt called, states pain is worse  Can we change MRI order to STAT?  (STAT is the only scans that [facility] are scheduling)  Prior auth has already been processed  Please advise

## 2018-04-23 NOTE — Telephone Encounter (Signed)
Contacted Shane over at [facility] radiology. [name] states that MRI of lumbar spine for someone with very painful sciatica is appropriate for STAT.

## 2018-04-23 NOTE — Telephone Encounter (Signed)
Scheduled pt aware

## 2018-04-23 NOTE — Telephone Encounter (Signed)
Please advise. Thank you

## 2018-04-23 NOTE — Telephone Encounter (Signed)
Ok lets change it to stat, alert brendale notify pt, we cannot know about injection versus neurosurg ref until mri odone

## 2018-04-24 ENCOUNTER — Ambulatory Visit (INDEPENDENT_AMBULATORY_CARE_PROVIDER_SITE_OTHER): Payer: No Typology Code available for payment source | Admitting: Family Medicine

## 2018-04-24 ENCOUNTER — Other Ambulatory Visit: Payer: Self-pay

## 2018-04-24 DIAGNOSIS — M5432 Sciatica, left side: Secondary | ICD-10-CM

## 2018-04-24 NOTE — Progress Notes (Signed)
   Subjective:    Patient ID: [name], male    DOB: 1976/01/08, 43 y.o.   MRN: [id number] Video plus audio interaction done HPIFollow up MRI lumbar spine results. Pt states no other concerns or problems today.   Virtual Visit via Telephone Note  I connected with [name] on 04/24/18 at  1:10 PM EDT by telephone and verified that I am speaking with the correct person using two identifiers.   I discussed the limitations, risks, security and privacy concerns of performing an evaluation and management service by telephone and the availability of in person appointments. I also discussed with the patient that there may be a patient responsible charge related to this service. The patient expressed understanding and agreed to proceed.      History of Present Illness:    Observations/Objective:   Assessment and Plan:   Follow Up Instructions:    I discussed the assessment and treatment plan with the patient. The patient was provided an opportunity to ask questions and all were answered. The patient agreed with the plan and demonstrated an understanding of the instructions.   The patient was advised to call back or seek an in-person evaluation if the symptoms worsen or if the condition fails to improve as anticipated.  I provided 25  minutes of non-face-to-face time during this encounter.  Very long discussion held.  MRI did not show major compression however there is potential for L4 and L5 involvement.  Patient is having severe sciatica.  Pain is disabling.  Very frustrated about it.  Has had multiple physical therapies via the chiropractor without any benefit.  Medication not helping much.  Pain so severe unable to work.     Review of Systems     Objective:   Physical Exam        Assessment & Plan:  Impression progressive sciatica pain L4-L5 distribution left side.  MRI not particularly impressive but pain and presentation very impressive.  Therefore we will do  referral for potential intervention via localized injection.  Discussed.  Advised patient that they will be also assessing his MRI and determining suitability of interventional radiology.  Greater than 50% of this 25 minute face to face visit was spent in counseling and discussion and coordination of care regarding the above diagnosis/diagnosies

## 2018-04-25 ENCOUNTER — Telehealth: Payer: Self-pay | Admitting: Family Medicine

## 2018-04-25 ENCOUNTER — Other Ambulatory Visit: Payer: Self-pay | Admitting: Family Medicine

## 2018-04-25 NOTE — Telephone Encounter (Signed)
[facility]. Spoke with Vickie; Vickie stated that this will be through spine speciality dept. Was transferred to [facility], left voicemail to get instruction on how to place order for interventional radiology.

## 2018-04-25 NOTE — Telephone Encounter (Signed)
Please put in an "order" to interventional radiology for this patient's injection needs  (was put in as a referral and can not be scheduled from referral per [facility], must be put in as an order)  Questions on how to put in as an order, please call [facility] [phone]  (they are currently placing patients on a wait list for 06/18/2018 or later)

## 2018-05-01 NOTE — Telephone Encounter (Signed)
Good, thx

## 2018-05-01 NOTE — Telephone Encounter (Signed)
Patient stated that interventional radiology contacted him last week and advised him they were not making any appointments at this time and they would call him after 06/18/18 to see if he was still interested in beng seen by their provider. Patient states he is working closely with his chiropractor and he is also doing a strengthening and exercising program recommended by the chiropractor.

## 2018-06-08 ENCOUNTER — Telehealth: Payer: Self-pay | Admitting: Family Medicine

## 2018-06-08 MED ORDER — OXYCODONE-ACETAMINOPHEN 5-325 MG PO TABS: 1.0000 | ORAL_TABLET | Freq: Four times a day (QID) | ORAL | 0 refills | Status: AC | PRN

## 2018-06-08 NOTE — Telephone Encounter (Signed)
Patient is requesting refill on oxycodone 5 mg for back pain called into [facility]

## 2018-06-08 NOTE — Telephone Encounter (Signed)
Pending;awaiting signature

## 2018-06-08 NOTE — Telephone Encounter (Signed)
Ok times one 

## 2018-06-14 ENCOUNTER — Encounter: Payer: No Typology Code available for payment source | Admitting: Family Medicine

## 2018-06-21 ENCOUNTER — Encounter: Payer: No Typology Code available for payment source | Admitting: Family Medicine

## 2018-06-28 ENCOUNTER — Other Ambulatory Visit: Payer: Self-pay

## 2018-06-28 ENCOUNTER — Ambulatory Visit (INDEPENDENT_AMBULATORY_CARE_PROVIDER_SITE_OTHER): Payer: No Typology Code available for payment source | Admitting: Family Medicine

## 2018-06-28 ENCOUNTER — Encounter: Payer: Self-pay | Admitting: Family Medicine

## 2018-06-28 VITALS — BP 110/80 | Temp 97.8°F | Ht 70.25 in | Wt 187.0 lb

## 2018-06-28 DIAGNOSIS — Z0000 Encounter for general adult medical examination without abnormal findings: Secondary | ICD-10-CM | POA: Diagnosis not present

## 2018-06-28 DIAGNOSIS — Z125 Encounter for screening for malignant neoplasm of prostate: Secondary | ICD-10-CM

## 2018-06-28 DIAGNOSIS — I10 Essential (primary) hypertension: Secondary | ICD-10-CM

## 2018-06-28 MED ORDER — PROPRANOLOL HCL 20 MG PO TABS: 20.0000 mg | ORAL_TABLET | Freq: Two times a day (BID) | ORAL | 5 refills | Status: DC

## 2018-06-28 MED ORDER — ONDANSETRON 4 MG PO TBDP: ORAL_TABLET | ORAL | 0 refills | Status: DC

## 2018-06-28 MED ORDER — ELETRIPTAN HYDROBROMIDE 40 MG PO TABS: ORAL_TABLET | ORAL | 5 refills | Status: DC

## 2018-06-28 MED ORDER — LISINOPRIL 10 MG PO TABS: 10.0000 mg | ORAL_TABLET | Freq: Every day | ORAL | 1 refills | Status: DC

## 2018-06-28 NOTE — Progress Notes (Signed)
   Subjective:    Patient ID: [name], male    DOB: [birth year], 43 y.o.   MRN: [id number]  HPI The patient comes in today for a wellness visit.    A review of their health history was completed.  A review of medications was also completed.  Any needed refills; zofran. Takes when he gets migraines.   Eating habits: health conscious  Falls/  MVA accidents in past few months: none  Regular exercise: walks daily  Specialist pt sees on regular basis: none  Preventative health issues were discussed.   Additional concerns: none   Exercise regimen, walking reg, doing extra   When necessary    Diet watching closely   Hx of elevted trigly and glu   Flu shot yr       Review of Systems  Constitutional: Negative for activity change, appetite change and fever.  HENT: Negative for congestion and rhinorrhea.   Eyes: Negative for discharge.  Respiratory: Negative for cough and wheezing.   Cardiovascular: Negative for chest pain.  Gastrointestinal: Negative for abdominal pain, blood in stool and vomiting.  Genitourinary: Negative for difficulty urinating and frequency.  Musculoskeletal: Negative for neck pain.  Skin: Negative for rash.  Allergic/Immunologic: Negative for environmental allergies and food allergies.  Neurological: Negative for weakness and headaches.  Psychiatric/Behavioral: Negative for agitation.  All other systems reviewed and are negative.      Objective:   Physical Exam Vitals signs reviewed.  Constitutional:      Appearance: He is well-developed.  HENT:     Head: Normocephalic and atraumatic.     Right Ear: External ear normal.     Left Ear: External ear normal.     Nose: Nose normal.  Eyes:     Pupils: Pupils are equal, round, and reactive to light.  Neck:     Musculoskeletal: Normal range of motion and neck supple.     Thyroid: No thyromegaly.  Cardiovascular:     Rate and Rhythm: Normal rate and regular rhythm.     Heart sounds:  Normal heart sounds. No murmur.  Pulmonary:     Effort: Pulmonary effort is normal. No respiratory distress.     Breath sounds: Normal breath sounds. No wheezing.  Abdominal:     General: Bowel sounds are normal. There is no distension.     Palpations: Abdomen is soft. There is no mass.     Tenderness: There is no abdominal tenderness.  Genitourinary:    Penis: Normal.   Musculoskeletal: Normal range of motion.  Lymphadenopathy:     Cervical: No cervical adenopathy.  Skin:    General: Skin is warm and dry.     Findings: No erythema.  Neurological:     Mental Status: He is alert.     Motor: No abnormal muscle tone.  Psychiatric:        Behavior: Behavior normal.        Judgment: Judgment normal.           Assessment & Plan:  Impression 1 wellness exam.  Diet discussed.  Exercise discussed.  Appropriate screening blood work ordered.  Positive family history of prostate cancer  2.  Hypertension good control discussed maintain same meds  3.  Migraines good control discussed maintain same  Follow-up in 6 months diet exercise discussed

## 2018-07-07 LAB — HEPATIC FUNCTION PANEL
ALT: 15 IU/L (ref 0–44)
AST: 14 IU/L (ref 0–40)
Albumin: 4.8 g/dL (ref 4.0–5.0)
Alkaline Phosphatase: 72 IU/L (ref 39–117)
Bilirubin Total: 0.7 mg/dL (ref 0.0–1.2)
Bilirubin, Direct: 0.17 mg/dL (ref 0.00–0.40)
Total Protein: 7.3 g/dL (ref 6.0–8.5)

## 2018-07-07 LAB — BASIC METABOLIC PANEL
BUN/Creatinine Ratio: 12 (ref 9–20)
BUN: 11 mg/dL (ref 6–24)
CO2: 26 mmol/L (ref 20–29)
Calcium: 9.7 mg/dL (ref 8.7–10.2)
Chloride: 101 mmol/L (ref 96–106)
Creatinine, Ser: 0.92 mg/dL (ref 0.76–1.27)
GFR calc Af Amer: 117 mL/min/{1.73_m2} (ref >59)
GFR calc non Af Amer: 101 mL/min/{1.73_m2} (ref >59)
Glucose: 86 mg/dL (ref 65–99)
Potassium: 4 mmol/L (ref 3.5–5.2)
Sodium: 140 mmol/L (ref 134–144)

## 2018-07-07 LAB — LIPID PANEL
Chol/HDL Ratio: 5.7 ratio — ABNORMAL HIGH (ref 0.0–5.0)
Cholesterol, Total: 183 mg/dL (ref 100–199)
HDL: 32 mg/dL — ABNORMAL LOW (ref >39)
LDL Calculated: 99 mg/dL (ref 0–99)
Triglycerides: 260 mg/dL — ABNORMAL HIGH (ref 0–149)
VLDL Cholesterol Cal: 52 mg/dL — ABNORMAL HIGH (ref 5–40)

## 2018-07-07 LAB — PSA: Prostate Specific Ag, Serum: 0.9 ng/mL (ref 0.0–4.0)

## 2018-07-11 ENCOUNTER — Telehealth: Payer: Self-pay | Admitting: Family Medicine

## 2018-07-11 MED FILL — PROPRANOLOL 20 MG TABLET: 20 | 30 days supply | Qty: 60 | Fill #0

## 2018-07-11 NOTE — Telephone Encounter (Signed)
Labs from 07/06/18 in results

## 2018-07-11 NOTE — Telephone Encounter (Signed)
Patient would like results of lab

## 2018-07-12 NOTE — Telephone Encounter (Signed)
Lisinopril generally safe and well tolerated and protective of heart and kidneys, so not sure what friends have against it.  Sure, stop, mnitor bp, if numbers rise with top 140 or higher or bottom 90 or higher, rec o v to discuss alternatives

## 2018-07-12 NOTE — Telephone Encounter (Signed)
Results discussed with patient. Patient verbalized understanding.  Patient would like to ask Dr Richardson Landry if he thinks he can stop his Lisinopril. Patient states he has had coworkers and friends say bad things about that med and he would feel better if he could stop it

## 2018-07-12 NOTE — Telephone Encounter (Signed)
Dr. Richardson Landry out currently As for his blood work PSA is normal 0.9 Cholesterol profile looks improved LDL 99-goal less than 100 Good cholesterol slightly low but this has been always the case Liver enzymes look good Kidney function sugar looks good Continue current medications healthy diet, follow-up with Dr. Richardson Landry later this year A copy of these labs are being mailed to the patient

## 2018-07-13 NOTE — Telephone Encounter (Signed)
Patient advised per Dr Richardson Landry: Lisinopril generally safe and well tolerated and protective of heart and kidneys, so not sure what friends have against it.  Sure, stop, monitor bp, if numbers rise with top 140 or higher or bottom 90 or higher, rec o v to discuss alternatives  Patient verbalized understanding.

## 2018-09-05 ENCOUNTER — Other Ambulatory Visit: Payer: Self-pay

## 2018-09-05 ENCOUNTER — Ambulatory Visit (INDEPENDENT_AMBULATORY_CARE_PROVIDER_SITE_OTHER): Payer: No Typology Code available for payment source | Admitting: Family Medicine

## 2018-09-05 DIAGNOSIS — R51 Headache: Secondary | ICD-10-CM

## 2018-09-05 DIAGNOSIS — R05 Cough: Secondary | ICD-10-CM

## 2018-09-05 DIAGNOSIS — Z20822 Contact with and (suspected) exposure to covid-19: Secondary | ICD-10-CM

## 2018-09-05 DIAGNOSIS — R6889 Other general symptoms and signs: Secondary | ICD-10-CM | POA: Diagnosis not present

## 2018-09-05 NOTE — Progress Notes (Signed)
   Subjective:  Audio plus video  Patient ID: [name], male    DOB: 10/18/1975, 43 y.o.   MRN: [id number]  Sore Throat  This is a new problem. Episode onset: 2-3 days. Associated symptoms include coughing and headaches. Associated symptoms comments: Achey, sore throat, upset stomach, cough. No fever, no short ness of breath. He has tried acetaminophen (Zyrtec) for the symptoms. The treatment provided mild relief.   Virtual Visit via Video Note  I connected with [name] on 09/05/18 at  3:00 PM EDT by a video enabled telemedicine application and verified that I am speaking with the correct person using two identifiers.  Location: Patient: home Provider: office   I discussed the limitations of evaluation and management by telemedicine and the availability of in person appointments. The patient expressed understanding and agreed to proceed.  History of Present Illness:    Observations/Objective:   Assessment and Plan:   Follow Up Instructions:    I discussed the assessment and treatment plan with the patient. The patient was provided an opportunity to ask questions and all were answered. The patient agreed with the plan and demonstrated an understanding of the instructions.   The patient was advised to call back or seek an in-person evaluation if the symptoms worsen or if the condition fails to improve as anticipated.  I provided 25 minutes of non-face-to-face time during this encounter.   Vicente Males, LPN Diffuse headache steady nature comes and goes at times sharp.  Also notes sore throat.  Worse in the morning.  Definitely has a cough.  No obvious wheeziness.  No major shortness of breath.  Some drainage with it   Review of Systems  Respiratory: Positive for cough.   Neurological: Positive for headaches.       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  Cough headaches myalgias sore throat diminished energy.  COVID-19 testing definitely warranted rationale discussed numerous questions answered  Very long discussion held regarding management.  Quarantine features.  Warning signs.  Process regarding testing etc. etc.  Greater than 50% of this 25 minutenon face to face visit was spent in counseling and discussion and coordination of care regarding the above diagnosis/diagnosies

## 2018-09-06 ENCOUNTER — Encounter: Payer: Self-pay | Admitting: Family Medicine

## 2018-09-10 ENCOUNTER — Telehealth: Payer: Self-pay | Admitting: *Deleted

## 2018-09-10 NOTE — Telephone Encounter (Signed)
Patient states that he had covid testing done via the [facility] testing site on 09/06/18 but we see no record of this being done or results and Dr Mickie Hillier would like to check on this please. Thanks

## 2018-09-10 NOTE — Telephone Encounter (Signed)
Attempted to return call to [place] @ [phone]; number was busy with several attempts.

## 2018-09-10 NOTE — Telephone Encounter (Signed)
Called Autumn, nurse with Dr. Lance Sell office re: COVID test.  Advised Autumn that with pt. being a [employer] Employee, he will need to contact [facility] to receive COVID test results.  She reported that the pt. Has called several times, attempting to get results.  Gave phone number for [facility]: [phone].  Verb. Understanding.

## 2018-09-10 NOTE — Telephone Encounter (Signed)
Spoke with [place] [facility] that stated when a patient is an employee of [employer] the order and results are not put in [place] for confidentiality. The patient himself must call [facility] at [phone] to get the results.

## 2018-11-13 MED FILL — ELETRIPTAN HYDROBROMIDE 40: 40 | 30 days supply | Qty: 10 | Fill #0

## 2018-11-13 MED FILL — PROPRANOLOL 20 MG TABLET: 20 | 30 days supply | Qty: 60 | Fill #0

## 2018-11-22 MED FILL — PROPRANOLOL 20 MG TABLET: 20 | 30 days supply | Qty: 60 | Fill #0

## 2018-11-22 MED FILL — ELETRIPTAN HYDROBROMIDE 40: 40 | 30 days supply | Qty: 10 | Fill #0

## 2018-11-29 ENCOUNTER — Encounter: Payer: Self-pay | Admitting: Family Medicine

## 2018-11-29 ENCOUNTER — Ambulatory Visit (INDEPENDENT_AMBULATORY_CARE_PROVIDER_SITE_OTHER): Payer: No Typology Code available for payment source | Admitting: Family Medicine

## 2018-11-29 ENCOUNTER — Other Ambulatory Visit: Payer: Self-pay

## 2018-11-29 DIAGNOSIS — M272 Inflammatory conditions of jaws: Secondary | ICD-10-CM

## 2018-11-29 MED ORDER — AMOXICILLIN-POT CLAVULANATE 875-125 MG PO TABS: 1.0000 | ORAL_TABLET | Freq: Two times a day (BID) | ORAL | 0 refills | Status: AC

## 2018-11-29 NOTE — Progress Notes (Signed)
   Subjective:  Audio plus video  Patient ID: [name], male    DOB: [birth year], 43 y.o.   MRN: [id number]  Sinus Problem This is a new problem. The current episode started 1 to 4 weeks ago.   Patient went to dentist and they told him his sinuses were inflamed and gave him Amoxil and it did better so he stopped before finishing them and it has come back.  Virtual Visit via Video Note  I connected with [name] on 11/29/18 at  9:30 AM EST by a video enabled telemedicine application and verified that I am speaking with the correct person using two identifiers.  Location: Patient: home Provider: office   I discussed the limitations of evaluation and management by telemedicine and the availability of in person appointments. The patient expressed understanding and agreed to proceed.  History of Present Illness:    Observations/Objective:   Assessment and Plan:   Follow Up Instructions:    I discussed the assessment and treatment plan with the patient. The patient was provided an opportunity to ask questions and all were answered. The patient agreed with the plan and demonstrated an understanding of the instructions.   The patient was advised to call back or seek an in-person evaluation if the symptoms worsen or if the condition fails to improve as anticipated.  I provided 20 minutes of non-face-to-face time during this encounter.   Patient had a dental infection.  Was given plain amoxicillin.  Developed right upper jaw pain.  Did not take the whole course of medicine.  Now pain has recurred.  No congestion no cough no sore throat no shortness of breath no fever.  All focused on the genital area of concern.  Was advised by his dentist to call us for further intervention    Review of Systems See above    Objective:   Physical Exam   Virtual     Assessment & Plan:  Impression odontogenic infection.  Augmentin twice daily for 10 days.  Add probiotic rationale  discussed symptom care discussed warning signs discussed

## 2018-12-02 ENCOUNTER — Ambulatory Visit
Admission: EM | Admit: 2018-12-02 | Discharge: 2018-12-02 | Disposition: A | Discharge status: Home / Self Care | Payer: No Typology Code available for payment source | Attending: Physician Assistant | Admitting: Physician Assistant

## 2018-12-02 ENCOUNTER — Other Ambulatory Visit: Payer: Self-pay

## 2018-12-02 DIAGNOSIS — H9202 Otalgia, left ear: Secondary | ICD-10-CM | POA: Diagnosis not present

## 2018-12-02 DIAGNOSIS — M542 Cervicalgia: Secondary | ICD-10-CM | POA: Diagnosis not present

## 2018-12-02 LAB — POC SARS CORONAVIRUS 2 AG -  ED: SARS Coronavirus 2 Ag: NEGATIVE

## 2018-12-02 MED ORDER — FLUTICASONE PROPIONATE 50 MCG/ACT NA SUSP: 2.0000 | Freq: Every day | NASAL | 0 refills | Status: DC

## 2018-12-02 MED ORDER — MELOXICAM 7.5 MG PO TABS: 7.5000 mg | ORAL_TABLET | Freq: Every day | ORAL | 0 refills | Status: DC

## 2018-12-02 MED ORDER — IPRATROPIUM BROMIDE 0.06 % NA SOLN: 2.0000 | Freq: Four times a day (QID) | NASAL | 0 refills | Status: DC

## 2018-12-02 MED ORDER — PREDNISONE 50 MG PO TABS: 50.0000 mg | ORAL_TABLET | Freq: Every day | ORAL | 0 refills | Status: DC

## 2018-12-02 MED ORDER — DEXAMETHASONE SODIUM PHOSPHATE 10 MG/ML IJ SOLN
10.0000 mg | Freq: Once | INTRAMUSCULAR | Status: AC
  Administered 2018-12-02: 10 mg via INTRAMUSCULAR

## 2018-12-02 NOTE — ED Provider Notes (Signed)
[facility]    CSN: [id number] Arrival date & time: 12/02/18  1151      History   Chief Complaint Chief Complaint  Patient presents with  . otalgia, neck pain, headache    HPI [name] is a 43 y.o. male.   43 year old male comes in for 5 day history of left ear pain, left neck pain, headache.  Patient first started with right jaw pain about 2 to 3 weeks ago, saw dentist, who started him on amoxicillin as x-ray showed sinus mucus.  Symptoms resolved, and went back to dentist for recheck.  Was told to follow-up with orthodontist for dental problems.  Started having current symptoms 5 days ago, and did visit with PCP, started on Augmentin twice daily 4 days ago.  He has been taking as directed, with Allegra and Sudafed with minimal relief.  Headache is left-sided, constant, waxing and waning in intensity.  Movement of the neck can cause pain to be worse.  Denies photophobia, phonophobia.  He does have nausea that is associated with a headache, stating this is different than his normal migraines.  Denies fever, chills, body aches.  Denies rhinorrhea, nasal congestion.  Does have mild cough, sore throat that started about 2 to 3 days ago.  Denies shortness of breath, loss of taste or smell.  Took ibuprofen/Tylenol with mild relief.     Past Medical History:  Diagnosis Date  . Allergy   . Anxiety   . Chronic neck pain   . Chronic pain   . DDD (degenerative disc disease)   . Herniated cervical disc   . Hypertension   . IBS (irritable bowel syndrome)   . Migraines    since 2002  . Renal calculi   . Shingles   . Sleep apnea    does not wear c-pap    Patient Active Problem List   Diagnosis Date Noted  . Essential hypertension 08/11/2016  . Sleep apnea, obstructive 08/11/2016  . 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 S/P cervical spinal fusion 08/15/2013  . Migraine without aura 04/17/2012  . Cervicalgia 04/17/2012    Past Surgical History:   Procedure Laterality Date  . ANTERIOR CERVICAL DECOMP/DISCECTOMY FUSION N/A 08/15/2013   Procedure: Anterior Cervical Four-Five/Five-Six/Six-Seven Decompression and Fusion;  Surgeon: Eustace Moore, MD;  Location: [place] [facility];  Service: Neurosurgery;  Laterality: N/A;  . LITHOTRIPSY    . WISDOM TOOTH EXTRACTION         Home Medications    Prior to Admission medications   Medication Sig Start Date End Date Taking? Authorizing Provider  albuterol (PROVENTIL HFA;VENTOLIN HFA) 108 (90 Base) MCG/ACT inhaler Inhale 2 puffs into the lungs every 6 (six) hours as needed for wheezing or shortness of breath. Patient not taking: Reported on 06/28/2018 12/07/17   Mikey Kirschner, MD  amoxicillin-clavulanate (AUGMENTIN) 875-125 MG tablet Take 1 tablet by mouth 2 (two) times daily for 10 days. 11/29/18 12/09/18  Mikey Kirschner, MD  eletriptan (RELPAX) 40 MG tablet TAKE 1 TABLET BY MOUTH AS NEEDED FOR MIGRAINE. MAY REPEAT IN 2 HOURS IF NECESSARY AS NEEDED FOR MIGRAINES. MAX 2 TAB/24 HRS. 06/28/18   Mikey Kirschner, MD  fluticasone (FLONASE) 50 MCG/ACT nasal spray Place 2 sprays into both nostrils daily. 12/02/18   Tasia Catchings, Amy V, PA-C  ipratropium (ATROVENT) 0.06 % nasal spray Place 2 sprays into both nostrils 4 (four) times daily. 12/02/18   Tasia Catchings, Amy V, PA-C  meloxicam (  MOBIC) 7.5 MG tablet Take 1 tablet (7.5 mg total) by mouth daily. 12/02/18   Tasia Catchings, Amy V, PA-C  mometasone (ELOCON) 0.1 % cream Apply 1 application topically 2 (two) times daily. Patient not taking: Reported on 04/24/2018 12/07/17   Mikey Kirschner, MD  ondansetron (ZOFRAN-ODT) 4 MG disintegrating tablet DISSOLVE 1 TABLET(4 MG) ON THE TONGUE EVERY 8 HOURS AS NEEDED FOR NAUSEA OR VOMITING 06/28/18   Mikey Kirschner, MD  predniSONE (DELTASONE) 50 MG tablet Take 1 tablet (50 mg total) by mouth daily with breakfast. 12/02/18   Tasia Catchings, Amy V, PA-C  propranolol (INDERAL) 20 MG tablet Take 1 tablet (20 mg total) by mouth 2 (two) times daily. 06/28/18    Mikey Kirschner, MD  tiZANidine (ZANAFLEX) 4 MG capsule Take 1 capsule (4 mg total) by mouth 3 (three) times daily as needed for muscle spasms. 03/19/18   Mikey Kirschner, MD  triamcinolone cream (KENALOG) 0.1 % Apply 1 application topically 2 (two) times daily. 12/07/17   Mikey Kirschner, MD    Family History Family History  Problem Relation Age of Onset  . Headache Mother   . Prostate cancer Father   . Kidney cancer Father   . Kidney Stones Sister   . Migraines Sister     Social History Social History   Tobacco Use  . Smoking status: Never Smoker  . Smokeless tobacco: Never Used  Substance Use Topics  . Alcohol use: No    Comment: rarely  . Drug use: No     Allergies   Erythromycin   Review of Systems Review of Systems  Reason unable to perform ROS: See HPI as above.     Physical Exam Triage Vital Signs ED Triage Vitals  Enc Vitals Group     BP 12/02/18 1240 (!) 169/103     Pulse Rate 12/02/18 1230 99     Resp 12/02/18 1230 16     Temp 12/02/18 1230 98.2 F (36.8 C)     Temp Source 12/02/18 1230 Oral     SpO2 12/02/18 1230 97 %     Weight --      Height --      Head Circumference --      Peak Flow --      Pain Score 12/02/18 1234 9     Pain Loc --      Pain Edu? --      Excl. in [place]? --    No data found.  Updated Vital Signs BP (!) 169/103 (BP Location: Right Arm)   Pulse 99   Temp 98.2 F (36.8 C) (Oral)   Resp 16   SpO2 97%   Physical Exam Constitutional:      General: He is not in acute distress.    Appearance: Normal appearance. He is not ill-appearing, toxic-appearing or diaphoretic.  HENT:     Head: Normocephalic and atraumatic.     Right Ear: Tympanic membrane, ear canal and external ear normal. Tympanic membrane is not erythematous or bulging.     Left Ear: Tympanic membrane, ear canal and external ear normal. Tympanic membrane is not erythematous or bulging.     Ears:     Comments: No tenderness to palpation of bilateral  tragus.    Nose: No congestion or rhinorrhea.     Right Sinus: No maxillary sinus tenderness or frontal sinus tenderness.     Left Sinus: No maxillary sinus tenderness or frontal sinus tenderness.     Mouth/Throat:  Mouth: Mucous membranes are moist.     Pharynx: Oropharynx is clear. Uvula midline.     Tonsils: No tonsillar exudate. 2+ on the right. 2+ on the left.     Comments: No tenderness to palpation of teeth/gums to the left. Neck:     Musculoskeletal: Normal range of motion and neck supple. Pain with movement present. No spinous process tenderness.     Comments: Tenderness to palpation along cervical lymph nodes, but no obvious lymphadenopathy felt.  Cardiovascular:     Rate and Rhythm: Normal rate and regular rhythm.     Heart sounds: Normal heart sounds. No murmur. No friction rub. No gallop.   Pulmonary:     Effort: Pulmonary effort is normal. No accessory muscle usage, prolonged expiration, respiratory distress or retractions.     Comments: Lungs clear to auscultation without adventitious lung sounds. Neurological:     General: No focal deficit present.     Mental Status: He is alert and oriented to person, place, and time.      UC Treatments / Results  Labs (all labs ordered are listed, but only abnormal results are displayed) Labs Reviewed  POC SARS CORONAVIRUS 2 ED    EKG   Radiology No results found.  Procedures Procedures (including critical care time)  Medications Ordered in UC Medications  dexamethasone (DECADRON) injection 10 mg (10 mg Intramuscular Given 12/02/18 1305)    Initial Impression / Assessment and Plan / UC Course  I have reviewed the triage vital signs and the nursing notes.  Pertinent labs & imaging results that were available during my care of the patient were reviewed by me and considered in my medical decision making (see chart for details).    Rapid Covid negative.  Will provide Decadron injection for sinus pressure, pain.   Will start patient on prednisone, nasal sprays to help with symptoms.  Return precautions given.  Patient expresses understanding and agrees to plan.  Final Clinical Impressions(s) / UC Diagnoses   Final diagnoses:  Left ear pain  Neck pain on left side   ED Prescriptions    Medication Sig Dispense Auth. Provider   predniSONE (DELTASONE) 50 MG tablet Take 1 tablet (50 mg total) by mouth daily with breakfast. 5 tablet Yu, Amy V, PA-C   ipratropium (ATROVENT) 0.06 % nasal spray Place 2 sprays into both nostrils 4 (four) times daily. 15 mL Yu, Amy V, PA-C   fluticasone (FLONASE) 50 MCG/ACT nasal spray Place 2 sprays into both nostrils daily. 1 g Yu, Amy V, PA-C   meloxicam (MOBIC) 7.5 MG tablet Take 1 tablet (7.5 mg total) by mouth daily. 15 tablet Ok Edwards, PA-C     PDMP not reviewed this encounter.   Ok Edwards, PA-C 12/02/18 1351

## 2018-12-02 NOTE — ED Triage Notes (Signed)
Pt prsents to UC w/ c/o severe left ear pain, left neck pain, headache x5 days. Pt has been taking allergy meds and antibiotic for sinus infection. Pt has taken ibuprofen and tylenol today.

## 2018-12-02 NOTE — Discharge Instructions (Signed)
Rapid COVID negative.  Continue Augmentin as directed.  Decadron injection in office today.  Prednisone, Atrovent as directed.  Can start Flonase in 2 days to help with ear pain/sinus pressure. Start Mobic. Do not take ibuprofen (motrin/advil)/ naproxen (aleve) while on mobic.  Follow-up with PCP for further evaluation if symptoms not improving.  If experiencing fever, headache, unable to move neck, sensitivity to light, confusion, go to the emergency department for further evaluation needed.

## 2018-12-19 ENCOUNTER — Telehealth: Payer: Self-pay | Admitting: Family Medicine

## 2018-12-19 NOTE — Telephone Encounter (Signed)
Discussed with pt to go ahead and take bp med and pt transferred to the front to schedule face to face ov next week with dr Richardson Landry

## 2018-12-19 NOTE — Telephone Encounter (Signed)
Pt wants to know if he should take BP medicine now or wait til he normally takes it at 10:30 before going into work tonight.

## 2018-12-19 NOTE — Telephone Encounter (Signed)
Go ahead and take it now, hrec face to face o v sometimes next wk

## 2018-12-19 NOTE — Telephone Encounter (Signed)
Pt was at dentist today and was unable to have procedure done due to BP being elevated. One was 159/112 and another was 165/124. Pt states he is feeling fine but the dentist wanted PCP to be aware.

## 2018-12-20 ENCOUNTER — Emergency Department (HOSPITAL_COMMUNITY)
Admission: EM | Admit: 2018-12-20 | Discharge: 2018-12-21 | Disposition: A | Discharge status: Home / Self Care | Payer: No Typology Code available for payment source | Attending: Emergency Medicine | Admitting: Emergency Medicine

## 2018-12-20 ENCOUNTER — Telehealth: Payer: Self-pay | Admitting: Family Medicine

## 2018-12-20 ENCOUNTER — Encounter (HOSPITAL_COMMUNITY): Payer: Self-pay | Admitting: Emergency Medicine

## 2018-12-20 ENCOUNTER — Other Ambulatory Visit: Payer: Self-pay

## 2018-12-20 DIAGNOSIS — I10 Essential (primary) hypertension: Secondary | ICD-10-CM | POA: Diagnosis not present

## 2018-12-20 DIAGNOSIS — R519 Headache, unspecified: Secondary | ICD-10-CM | POA: Diagnosis not present

## 2018-12-20 DIAGNOSIS — Z79899 Other long term (current) drug therapy: Secondary | ICD-10-CM | POA: Diagnosis not present

## 2018-12-20 LAB — COMPREHENSIVE METABOLIC PANEL
ALT: 26 U/L (ref 0–44)
AST: 23 U/L (ref 15–41)
Albumin: 4.3 g/dL (ref 3.5–5.0)
Alkaline Phosphatase: 81 U/L (ref 38–126)
Anion gap: 9 (ref 5–15)
BUN: 11 mg/dL (ref 6–20)
CO2: 27 mmol/L (ref 22–32)
Calcium: 9.1 mg/dL (ref 8.9–10.3)
Chloride: 100 mmol/L (ref 98–111)
Creatinine, Ser: 0.79 mg/dL (ref 0.61–1.24)
GFR calc Af Amer: >60 mL/min (ref >60)
GFR calc non Af Amer: >60 mL/min (ref >60)
Glucose, Bld: 100 mg/dL — ABNORMAL HIGH (ref 70–99)
Potassium: 3.3 mmol/L — ABNORMAL LOW (ref 3.5–5.1)
Sodium: 136 mmol/L (ref 135–145)
Total Bilirubin: 1.1 mg/dL (ref 0.3–1.2)
Total Protein: 7.5 g/dL (ref 6.5–8.1)

## 2018-12-20 LAB — CBC WITH DIFFERENTIAL/PLATELET
Abs Immature Granulocytes: 0.01 10*3/uL (ref 0.00–0.07)
Basophils Absolute: 0.1 10*3/uL (ref 0.0–0.1)
Basophils Relative: 1 %
Eosinophils Absolute: 0.1 10*3/uL (ref 0.0–0.5)
Eosinophils Relative: 1 %
HCT: 46.1 % (ref 39.0–52.0)
Hemoglobin: 16.4 g/dL (ref 13.0–17.0)
Immature Granulocytes: 0 %
Lymphocytes Relative: 27 %
Lymphs Abs: 1.8 10*3/uL (ref 0.7–4.0)
MCH: 30.1 pg (ref 26.0–34.0)
MCHC: 35.6 g/dL (ref 30.0–36.0)
MCV: 84.6 fL (ref 80.0–100.0)
Monocytes Absolute: 0.5 10*3/uL (ref 0.1–1.0)
Monocytes Relative: 7 %
Neutro Abs: 4.4 10*3/uL (ref 1.7–7.7)
Neutrophils Relative %: 64 %
Platelets: 324 10*3/uL (ref 150–400)
RBC: 5.45 MIL/uL (ref 4.22–5.81)
RDW: 12 % (ref 11.5–15.5)
WBC: 6.8 10*3/uL (ref 4.0–10.5)
nRBC: 0 % (ref 0.0–0.2)

## 2018-12-20 MED ORDER — AMLODIPINE BESYLATE 5 MG PO TABS: ORAL_TABLET | ORAL | 5 refills | Status: DC

## 2018-12-20 MED ORDER — ONDANSETRON 4 MG PO TBDP
4.0000 mg | ORAL_TABLET | Freq: Once | ORAL | Status: AC
  Administered 2018-12-20: 4 mg via ORAL
  Filled 2018-12-20: qty 1

## 2018-12-20 NOTE — Telephone Encounter (Signed)
Yes, add!  norvasc five mg p o qhs 30 5 ref, stay on other

## 2018-12-20 NOTE — Telephone Encounter (Signed)
Pt is wanting to let Dr. Richardson [name] know that last night his BP was still elevated it was 147/111. He took his BP medication this morning and is laying down now. But is wanting to know if his medication should be changed before coming in on Tuesday.

## 2018-12-20 NOTE — Telephone Encounter (Signed)
Contacted patient; pt verbalized understanding. Norvasc 5 mg sent to [facility] on [address] per pt request.

## 2018-12-20 NOTE — ED Triage Notes (Signed)
Patient reports elevated blood pressure today with headache and persistent emesis , denies fever or chills , no diarrhea . Patient stated he has not taken his antihypertensive medication as ordered.

## 2018-12-21 ENCOUNTER — Telehealth: Payer: Self-pay | Admitting: Family Medicine

## 2018-12-21 MED ORDER — DIPHENHYDRAMINE HCL 50 MG/ML IJ SOLN
12.5000 mg | Freq: Once | INTRAMUSCULAR | Status: AC
  Administered 2018-12-21: 03:00:00 12.5 mg via INTRAVENOUS
  Filled 2018-12-21: qty 1

## 2018-12-21 MED ORDER — METOCLOPRAMIDE HCL 5 MG/ML IJ SOLN
10.0000 mg | Freq: Once | INTRAMUSCULAR | Status: AC
  Administered 2018-12-21: 10 mg via INTRAVENOUS
  Filled 2018-12-21: qty 2

## 2018-12-21 NOTE — ED Provider Notes (Signed)
[place] [facility] Provider Note   CSN: [id number] Arrival date & time: 12/20/18  [id number]     History   Chief Complaint Chief Complaint  Patient presents with  . Hypertension  . Emesis  . Headache    HPI [name] is a 43 y.o. male.     Patient with history of migraine, HTN presents with headache since yesterday located along the left temporal area and across forehead. No fever, congestion, cough. He reports recent antibiotic treatment for left ear ache thought to be a dental infection by his dentist. He subsequently saw the endodontist who felt he may have a herpetic type infection intraorally as well as a cavity. He states that his blood pressure was too high for a dental procedure to be done and was referred back to his primary care doctor who added Norvasc to his medication regimen. This was yesterday and he has not started Norvasc yet. He presents tonight for concern for headache in the setting of elevated/uncontrolled blood pressure. No visual changes. He has been vomiting tonight and reports persistent nausea. He feels the current headache is different from his migraine history. No fever.   The history is provided by the patient. No language interpreter was used.  Hypertension Associated symptoms include headaches. Pertinent negatives include no chest pain and no shortness of breath.  Emesis Associated symptoms: headaches   Associated symptoms: no chills and no fever   Headache Associated symptoms: nausea and vomiting   Associated symptoms: no congestion, no fever, no neck stiffness and no photophobia     Past Medical History:  Diagnosis Date  . Allergy   . Anxiety   . Chronic neck pain   . Chronic pain   . DDD (degenerative disc disease)   . Herniated cervical disc   . Hypertension   . IBS (irritable bowel syndrome)   . Migraines    since 2002  . Renal calculi   . Shingles   . Sleep apnea    does not wear c-pap    Patient  Active Problem List   Diagnosis Date Noted  . Essential hypertension 08/11/2016  . Sleep apnea, obstructive 08/11/2016  . 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 S/P cervical spinal fusion 08/15/2013  . Migraine without aura 04/17/2012  . Cervicalgia 04/17/2012    Past Surgical History:  Procedure Laterality Date  . ANTERIOR CERVICAL DECOMP/DISCECTOMY FUSION N/A 08/15/2013   Procedure: Anterior Cervical Four-Five/Five-Six/Six-Seven Decompression and Fusion;  Surgeon: Eustace Moore, MD;  Location: [place] [facility];  Service: Neurosurgery;  Laterality: N/A;  . LITHOTRIPSY    . WISDOM TOOTH EXTRACTION          Home Medications    Prior to Admission medications   Medication Sig Start Date End Date Taking? Authorizing Provider  albuterol (PROVENTIL HFA;VENTOLIN HFA) 108 (90 Base) MCG/ACT inhaler Inhale 2 puffs into the lungs every 6 (six) hours as needed for wheezing or shortness of breath. Patient not taking: Reported on 06/28/2018 12/07/17   Mikey Kirschner, MD  amLODipine (NORVASC) 5 MG tablet Take one tablet po qhs 12/20/18   Mikey Kirschner, MD  eletriptan (RELPAX) 40 MG tablet TAKE 1 TABLET BY MOUTH AS NEEDED FOR MIGRAINE. MAY REPEAT IN 2 HOURS IF NECESSARY AS NEEDED FOR MIGRAINES. MAX 2 TAB/24 HRS. 06/28/18   Mikey Kirschner, MD  fluticasone (FLONASE) 50 MCG/ACT nasal spray Place 2 sprays into both nostrils daily. 12/02/18   Ok Edwards,  PA-C  ipratropium (ATROVENT) 0.06 % nasal spray Place 2 sprays into both nostrils 4 (four) times daily. 12/02/18   Tasia Catchings, Amy V, PA-C  meloxicam (MOBIC) 7.5 MG tablet Take 1 tablet (7.5 mg total) by mouth daily. 12/02/18   Tasia Catchings, Amy V, PA-C  mometasone (ELOCON) 0.1 % cream Apply 1 application topically 2 (two) times daily. Patient not taking: Reported on 04/24/2018 12/07/17   Mikey Kirschner, MD  ondansetron (ZOFRAN-ODT) 4 MG disintegrating tablet DISSOLVE 1 TABLET(4 MG) ON THE TONGUE EVERY 8 HOURS AS NEEDED FOR NAUSEA OR VOMITING 06/28/18    Mikey Kirschner, MD  predniSONE (DELTASONE) 50 MG tablet Take 1 tablet (50 mg total) by mouth daily with breakfast. 12/02/18   Tasia Catchings, Amy V, PA-C  propranolol (INDERAL) 20 MG tablet Take 1 tablet (20 mg total) by mouth 2 (two) times daily. 06/28/18   Mikey Kirschner, MD  tiZANidine (ZANAFLEX) 4 MG capsule Take 1 capsule (4 mg total) by mouth 3 (three) times daily as needed for muscle spasms. 03/19/18   Mikey Kirschner, MD  triamcinolone cream (KENALOG) 0.1 % Apply 1 application topically 2 (two) times daily. 12/07/17   Mikey Kirschner, MD    Family History Family History  Problem Relation Age of Onset  . Headache Mother   . Prostate cancer Father   . Kidney cancer Father   . Kidney Stones Sister   . Migraines Sister     Social History Social History   Tobacco Use  . Smoking status: Never Smoker  . Smokeless tobacco: Never Used  Substance Use Topics  . Alcohol use: No    Comment: rarely  . Drug use: No     Allergies   Erythromycin   Review of Systems Review of Systems  Constitutional: Negative for chills and fever.  HENT: Negative.  Negative for congestion.   Eyes: Negative for photophobia and visual disturbance.  Respiratory: Negative.  Negative for shortness of breath.   Cardiovascular: Negative.  Negative for chest pain.  Gastrointestinal: Positive for nausea and vomiting.  Musculoskeletal: Negative.  Negative for neck stiffness.  Skin: Negative.   Neurological: Positive for headaches.     Physical Exam Updated Vital Signs BP (!) 158/111   Pulse 92   Temp 98.1 F (36.7 C) (Oral)   Resp 12   SpO2 98%   Physical Exam Constitutional:      Appearance: He is well-developed.  HENT:     Head: Normocephalic.  Neck:     Musculoskeletal: Normal range of motion and neck supple.  Cardiovascular:     Rate and Rhythm: Normal rate and regular rhythm.  Pulmonary:     Effort: Pulmonary effort is normal.     Breath sounds: Normal breath sounds.  Abdominal:      General: Bowel sounds are normal.     Palpations: Abdomen is soft.     Tenderness: There is no abdominal tenderness. There is no guarding or rebound.  Musculoskeletal: Normal range of motion.  Skin:    General: Skin is warm and dry.     Findings: No rash.  Neurological:     Mental Status: He is alert and oriented to person, place, and time.     GCS: GCS eye subscore is 4. GCS verbal subscore is 5. GCS motor subscore is 6.     Cranial Nerves: No cranial nerve deficit.     Sensory: No sensory deficit.     Motor: No weakness.     Coordination: Coordination  normal.     Deep Tendon Reflexes: Reflexes normal.      ED Treatments / Results  Labs (all labs ordered are listed, but only abnormal results are displayed) Labs Reviewed  COMPREHENSIVE METABOLIC PANEL - Abnormal; Notable for the following components:      Result Value   Potassium 3.3 (*)    Glucose, Bld 100 (*)    All other components within normal limits  CBC WITH DIFFERENTIAL/PLATELET    EKG EKG Interpretation  Date/Time:  Thursday December 20 2018 19:21:00 EST Ventricular Rate:  84 PR Interval:  164 QRS Duration: 94 QT Interval:  356 QTC Calculation: 420 R Axis:   69 Text Interpretation: Normal sinus rhythm Normal ECG No significant change since last tracing Confirmed by Ripley Fraise [phone]) on 12/21/2018 1:50:23 AM   Radiology No results found.  Procedures Procedures (including critical care time)  Medications Ordered in ED Medications  metoCLOPramide (REGLAN) injection 10 mg (has no administration in time range)  diphenhydrAMINE (BENADRYL) injection 12.5 mg (has no administration in time range)  ondansetron (ZOFRAN-ODT) disintegrating tablet 4 mg (4 mg Oral Given 12/20/18 1929)     Initial Impression / Assessment and Plan / ED Course  I have reviewed the triage vital signs and the nursing notes.  Pertinent labs & imaging results that were available during my care of the patient were reviewed by me  and considered in my medical decision making (see chart for details).        Patient to ED with headache, N, V in the setting of elevated blood pressure. See HPI for detailed history.   He is well appearing on exam. No neuro deficits. Blood pressure has improved from time of arrival to 149/94. Headache unchanged.   Will give Reglan and benadryl for headache relief. Will need to reassess afterward, and ambulate. Without neuro deficits, and with improving blood pressure, do not feel CT head is indicated at this time.   3:30 - recheck - headache is resolved. No further nausea. BP 133/81. Patient is feeling much better and comfortable with discharge home.   He states he has mistakenly been take propranolol once daily and it is prescribed twice daily. His doctor was not aware of this when he added Norvasc. Instructed him to take his propranolol twice daily as it is prescribed, do not start Norvasc, and to measure blood pressure 3 times daily and keep a log of measurements to take to 1 week recheck with PCP.   Final Clinical Impressions(s) / ED Diagnoses   Final diagnoses:  None   1. Headache 2. Hypertension  ED Discharge Orders    None       Charlann Lange, PA-C 12/21/18 [id number]    Ripley Fraise, MD 12/21/18 [phone]

## 2018-12-21 NOTE — Telephone Encounter (Signed)
I agree

## 2018-12-21 NOTE — Telephone Encounter (Signed)
Pt appreciates [place] asking how he is doing. Pt states he checked his blood pressure this morning and it was 125/84, 3 hours later it was 141/91. Pt headache is not as severe; pt states he was given a migraine cocktail and that did help him. Pt did want to make sure that provider knew that he had not been taking blood pressure med normally before now, he may take it once a day or not at all. At this time, pt has been taking Propanolol as directed. Pt states his EKG is good. Pt will continue to check blood pressure regular until visit with Dr.Steve

## 2018-12-21 NOTE — ED Notes (Signed)
Pt verbalized understanding of d/c instructions, follow up care and s/s requiring return to ed. Pt had no further questions at this time.

## 2018-12-21 NOTE — Telephone Encounter (Signed)
Please advise. Thank you

## 2018-12-21 NOTE — ED Notes (Signed)
Pt now c/o headache 9/10 and nausea.

## 2018-12-21 NOTE — Discharge Instructions (Addendum)
Take your propranolol twice daily as prescribed. Do not start Norvasc. Measure blood pressure 3 times daily and keep a log to take with you to your doctor's in one week.   Return to the emergency department with any worsening symptoms or new concerns.

## 2018-12-21 NOTE — Telephone Encounter (Signed)
Pt wanted to let Dr.Steve know that he went to the ER yesterday after calling our nurse line  Nurse line advised pt to NOT take the Norvasc that Dr. Richardson Landry prescribed & the ER doc advised the same  ER doc told pt to take his propranolol (INDERAL) 20 MG tablet twice a day, monitor his BP & to notify us  Pt states he had been taking the propranolol once a day before going to the ER, pt states he has not picked up the Rx for Norvasc  Pt has virtual visit with Dr. Richardson Landry on 12/25/2018

## 2018-12-21 NOTE — Telephone Encounter (Signed)
So I reviewed over both his telephone message and ER visit The ER doctor recommended that he take his propranolol twice daily ER doctor recommend monitoring blood pressure regularly between now and the visit with Dr. Richardson Landry Also recommended holding the Norvasc for now As long as his blood pressures are looking fine that is okay Patient should keep follow-up visit with Dr. Roanna Raider please inquire with the patient how his blood pressures are doing and how is he feeling in regards to headache?  If his blood pressure is significantly elevated we may still need to initiate the Norvasc-this depends on how his blood pressure readings are doing currently

## 2018-12-25 ENCOUNTER — Ambulatory Visit (INDEPENDENT_AMBULATORY_CARE_PROVIDER_SITE_OTHER): Payer: No Typology Code available for payment source | Admitting: Family Medicine

## 2018-12-25 ENCOUNTER — Encounter: Payer: Self-pay | Admitting: Family Medicine

## 2018-12-25 ENCOUNTER — Other Ambulatory Visit: Payer: Self-pay

## 2018-12-25 VITALS — BP 150/92 | Temp 97.8°F | Wt 181.8 lb

## 2018-12-25 DIAGNOSIS — G43009 Migraine without aura, not intractable, without status migrainosus: Secondary | ICD-10-CM

## 2018-12-25 DIAGNOSIS — I10 Essential (primary) hypertension: Secondary | ICD-10-CM

## 2018-12-25 MED ORDER — PROPRANOLOL HCL 40 MG PO TABS: 40.0000 mg | ORAL_TABLET | Freq: Two times a day (BID) | ORAL | 5 refills | Status: DC

## 2018-12-25 NOTE — Progress Notes (Signed)
   Subjective:    Patient ID: [name], male    DOB: 12/22/1975, 43 y.o.   MRN: [id number]  Hypertension This is a chronic problem. The current episode started more than 1 year ago. Risk factors for coronary artery disease include male gender. Treatments tried: inderal BID.    Patient went to ER recently with elevated blood pressure- was told not to start the Norvasc till follow up visit  Recently has been thru endodontic interventions   Also recently in the er with bad migraine headaches  Has been on propranolol 20 bid but actually was only taking one daily   Complete hospital record and all notes and reports and test reviewed before evaluating patient   Despite increasing the propranolol to 20 twice daily patient still experiencing elevated blood pressures  Notes chest pain anterior chest over the xiphoid process.  Tender to touch.  Concerned about.  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  More migraine headaches  Review of Systems No cough no shortness of breath no abdominal    Objective:   Physical Exam  Alert and oriented, vitals reviewed and stable, NAD ENT-TM's and ext canals WNL bilat via otoscopic exam Soft palate, tonsils and post pharynx WNL via oropharyngeal exam Neck-symmetric, no masses; thyroid nonpalpable and nontender Pulmonary-no tachypnea or accessory muscle use; Clear without wheezes via auscultation Card--no abnrml murmurs, rhythm reg and rate WNL Carotid pulses symmetric, without bruits Blood pressure on repeat still elevated systolics in the 0000000 diastolic in the 0000000      Assessment & Plan:  Impression 1 hypertension.  Suboptimal control.  Increase propanolol to 40 twice daily.  2.  Persistent/recurrent migraine headaches.  Suboptimal control.  Hopefully increase in propanolol will help  3 anxiety discussion  4 chest pain musculoskeletal symptom care  discussed  Follow-up in 3 months call us in 1 month with blood pressure results  Greater than 50% of this 25 minute face to face visit was spent in counseling and discussion and coordination of care regarding the above diagnosis/diagnosies

## 2018-12-27 ENCOUNTER — Telehealth: Payer: Self-pay | Admitting: Family Medicine

## 2018-12-27 NOTE — Telephone Encounter (Signed)
Patient is needing a letter for his dentist office because he is getting some fillings done and they have to put him under but the last time his blood pressure was too high and they needing a letter stating  Was his last pressure was and its ok for him to be put under.

## 2018-12-28 ENCOUNTER — Ambulatory Visit: Payer: No Typology Code available for payment source | Admitting: Family Medicine

## 2019-02-18 ENCOUNTER — Other Ambulatory Visit: Payer: Self-pay | Admitting: Family Medicine

## 2019-02-18 ENCOUNTER — Encounter: Payer: Self-pay | Admitting: Family Medicine

## 2019-02-18 MED FILL — ELETRIPTAN HYDROBROMIDE 40: 40 | 30 days supply | Qty: 10 | Fill #0

## 2019-02-18 MED FILL — PROPRANOLOL 40 MG TABLET: 40 | 30 days supply | Qty: 60 | Fill #0

## 2019-03-27 MED FILL — PROPRANOLOL 40 MG TABLET: 40 | 30 days supply | Qty: 60 | Fill #1

## 2019-03-28 ENCOUNTER — Other Ambulatory Visit: Payer: Self-pay

## 2019-03-28 ENCOUNTER — Ambulatory Visit (INDEPENDENT_AMBULATORY_CARE_PROVIDER_SITE_OTHER): Payer: No Typology Code available for payment source | Admitting: Family Medicine

## 2019-03-28 DIAGNOSIS — I10 Essential (primary) hypertension: Secondary | ICD-10-CM

## 2019-03-28 DIAGNOSIS — G43009 Migraine without aura, not intractable, without status migrainosus: Secondary | ICD-10-CM | POA: Diagnosis not present

## 2019-03-28 MED ORDER — PROPRANOLOL HCL 40 MG PO TABS: 40.0000 mg | ORAL_TABLET | Freq: Two times a day (BID) | ORAL | 1 refills | Status: DC

## 2019-03-28 NOTE — Progress Notes (Signed)
   Subjective:  Audio  Patient ID: [name], male    DOB: December 31, 1975, 44 y.o.   MRN: [id number]  Hypertension This is a chronic problem. There are no compliance problems (takes propranolol every day, exercises, eats better than what he was doing).    Virtual Visit via Telephone Note  I connected with [name] on 03/28/19 at  8:30 AM EST by telephone and verified that I am speaking with the correct person using two identifiers.  Location: Patient: home Provider: office   I discussed the limitations, risks, security and privacy concerns of performing an evaluation and management service by telephone and the availability of in person appointments. I also discussed with the patient that there may be a patient responsible charge related to this service. The patient expressed understanding and agreed to proceed.   History of Present Illness:    Observations/Objective:   Assessment and Plan:   Follow Up Instructions:    I discussed the assessment and treatment plan with the patient. The patient was provided an opportunity to ask questions and all were answered. The patient agreed with the plan and demonstrated an understanding of the instructions.   The patient was advised to call back or seek an in-person evaluation if the symptoms worsen or if the condition fails to improve as anticipated.  I provided 22 minutes of non-face-to-face time during this encounter.  140 over 96   Closer to 130s over 80s most of the time when he checks it.  On cpap definitely helps  cpap compliant.  Definitely helps his sleep architecture.  Also considerably helps his daytime tiredness.   States blood pressures overall are in good shape.  Compliant with medication.  Notes the increase in propranolol has definitely helped his migraine headaches.  No chest pain no shortness of breath no abdominal pain Review of Systems    Objective:   Physical Exam   Virtual     Assessment &  Plan:  Impression hypertension.  Improved control discussed maintain same dose of meds  Migraine headaches.  Clinically much improved to maintain same meds  3.  Sleep apnea.  Ongoing.  Substantial.  Definite benefit from CPAP.  Patient to maintain.

## 2019-04-08 ENCOUNTER — Telehealth: Payer: Self-pay | Admitting: Family Medicine

## 2019-04-08 NOTE — Telephone Encounter (Signed)
[facility] (DME company for [facility]) called requesting a Rx for a replacement CPAP & supplies  States machine is 46 years old  They need Rx with Dx listed & pressure settings (if unknown order can state 4-20cm H2O), mask/nasal pillows of choice, & supplies  They also need a copy of pt's sleep study  Please fax to [facility]

## 2019-04-08 NOTE — Telephone Encounter (Signed)
Unable to locate sleep study results  [facility] for pt - ?when/where it was done so we can get copy of results

## 2019-04-11 NOTE — Telephone Encounter (Signed)
Spoke with patient, he's [facility] for medical records at [facility] to get the sleep study results sent here I sent message to [facility] w/[facility] - ?do they have results since pt got original machine at [facility]

## 2019-04-11 NOTE — Telephone Encounter (Signed)
[facility] called, asking for turn around time on [place] sending order & required documentation, explained that as soon as we have info needed we will send

## 2019-04-12 ENCOUNTER — Other Ambulatory Visit: Payer: Self-pay

## 2019-04-12 ENCOUNTER — Ambulatory Visit: Payer: No Typology Code available for payment source | Attending: Internal Medicine

## 2019-04-12 DIAGNOSIS — Z23 Encounter for immunization: Secondary | ICD-10-CM

## 2019-04-12 NOTE — Progress Notes (Signed)
   Covid-19 Vaccination Clinic  Name:  [name]    MRN: [id number] DOB: 1975/07/27  04/12/2019  [name] was observed post Covid-19 immunization for 15 minutes without incident. He was provided with Vaccine Information Sheet and instruction to access the V-Safe system.   [name] was instructed to call 911 with any severe reactions post vaccine: Marland Kitchen Difficulty breathing  . Swelling of face and throat  . A fast heartbeat  . A bad rash all over body  . Dizziness and weakness   Immunizations Administered    Name Date Dose VIS Date Route   Moderna COVID-19 Vaccine 04/12/2019 10:02 AM 0.5 mL 12/18/2018 Intramuscular   Manufacturer: Moderna   Lot: [id number]   [id number]

## 2019-04-12 NOTE — Telephone Encounter (Signed)
Please sign the flagged Rx & Physician's Order for pt's replacement CPAP so that it can be faxed with all required documentation   In green folder in yellow box on wall

## 2019-04-17 NOTE — Telephone Encounter (Signed)
Faxed signed order with all required documentation, sent original sleep study to be scanned, filed  Faxed everything 04/15/2019 & 04/16/2019

## 2019-04-29 ENCOUNTER — Ambulatory Visit (INDEPENDENT_AMBULATORY_CARE_PROVIDER_SITE_OTHER): Payer: No Typology Code available for payment source | Admitting: Family Medicine

## 2019-04-29 ENCOUNTER — Other Ambulatory Visit: Payer: Self-pay

## 2019-04-29 ENCOUNTER — Encounter: Payer: Self-pay | Admitting: Family Medicine

## 2019-04-29 VITALS — BP 128/78 | Temp 98.4°F | Wt 193.0 lb

## 2019-04-29 DIAGNOSIS — R21 Rash and other nonspecific skin eruption: Secondary | ICD-10-CM | POA: Diagnosis not present

## 2019-04-29 NOTE — Progress Notes (Signed)
   Subjective:    Patient ID: [name], male    DOB: [birth year], 44 y.o.   MRN: [id number]  HPI Pt here today for multiple red areas on back area. Mainly toward top of back. Pt states that the areas do itch at times. Pt states his wife would like areas to be looked at and make sure they are of no concern.    Pt has moles   Sometimes itches a bit     Review of Systems No headache, no major weight loss or weight gain, no chest pain no back pain abdominal pain no change in bowel habits complete ROS otherwise negative     Objective:   Physical Exam   Alert vitals stable, NAD. Blood pressure good on repeat. HEENT normal. Lungs clear. Heart regular rate and rhythm. Numerous nevi on back shoulders upper torso.  All examined.  No changes which would warrant intervention at this time     Assessment & Plan:  Impression multiple nevi.  All within normal limits.  Reassurance per recommendation eventually to see a dermatologist yearly with just this year number of nevi present questions answered  Greater than 50% of this 20 minute face to face visit was spent in counseling and discussion and coordination of care regarding the above diagnosis/diagnosies

## 2019-05-07 MED FILL — PROPRANOLOL 40 MG TABLET: 40 | 90 days supply | Qty: 180 | Fill #0

## 2019-05-07 MED FILL — ELETRIPTAN HYDROBROMIDE 40: 40 | 30 days supply | Qty: 10 | Fill #1

## 2019-05-09 ENCOUNTER — Telehealth (INDEPENDENT_AMBULATORY_CARE_PROVIDER_SITE_OTHER): Payer: No Typology Code available for payment source | Admitting: Family Medicine

## 2019-05-09 ENCOUNTER — Other Ambulatory Visit: Payer: Self-pay

## 2019-05-09 DIAGNOSIS — R195 Other fecal abnormalities: Secondary | ICD-10-CM

## 2019-05-09 DIAGNOSIS — J069 Acute upper respiratory infection, unspecified: Secondary | ICD-10-CM

## 2019-05-09 NOTE — Progress Notes (Signed)
Patient ID: [name], male    DOB: October 23, 1975, 44 y.o.   MRN: [id number]   Chief Complaint  Patient presents with  . Cough     Virtual Visit via Telephone Note  I connected with [name] on 05/09/19 at  2:00 PM EDT by telephone and verified that I am speaking with the correct person using two identifiers.  Location: Patient: [name] Provider: Elvia Collum, DO   I discussed the limitations, risks, security and privacy concerns of performing an evaluation and management service by telephone and the availability of in person appointments. I also discussed with the patient that there may be a patient responsible charge related to this service. The patient expressed understanding and agreed to proceed.  Subjective:   HPI Pt having light cough and mild sob for the last 3 days.  Having some intermittent chest pressure, but not enough to take his inhaler.  And upset stomach intermittent over 2 weeks, where he's having loose stools.  No fever.  Having a mild headache. Does have a history of migraines. Had first covid vaccine 3 wks ago. no abd pain or blood in stool, no vomiting. At home for work for next 3 days.  Wanting to visit with father that has liver disease this weekend.   Medical History [name] has a past medical history of Allergy, Anxiety, Chronic neck pain, Chronic pain, DDD (degenerative disc disease), Herniated cervical disc, Hypertension, IBS (irritable bowel syndrome), Migraines, Renal calculi, Shingles, and Sleep apnea.   Outpatient Encounter Medications as of 05/09/2019  Medication Sig  . cetirizine (ZYRTEC) 10 MG tablet Take 10 mg by mouth daily.  Marland Kitchen eletriptan (RELPAX) 40 MG tablet TAKE 1 TABLET BY MOUTH AS NEEDED FOR MIGRAINE. MAY REPEAT IN 2 HOURS IF NECESSARY AS NEEDED FOR MIGRAINES. MAX 2 TAB/24 HRS.  Marland Kitchen OVER THE COUNTER MEDICATION Fish oil  . propranolol (INDERAL) 40 MG tablet Take 1 tablet (40 mg total) by mouth 2 (two) times daily.    Facility-Administered Encounter Medications as of 05/09/2019  Medication  . methylPREDNISolone sodium succinate (SOLU-MEDROL) 125 mg/2 mL injection 125 mg     Review of Systems  Constitutional: Negative for chills and fever.  HENT: Negative for congestion, ear pain, rhinorrhea, sinus pressure, sinus pain, sneezing and sore throat.   Eyes: Negative for pain, discharge and itching.  Respiratory: Positive for cough and shortness of breath. Negative for wheezing.   Gastrointestinal: Positive for diarrhea (loose stools). Negative for abdominal pain, nausea and vomiting.  Skin: Negative for rash.  Neurological: Negative for headaches.     Vitals There were no vitals taken for this visit.  Objective:   Physical Exam  No PE due to phone visit.  Assessment and Plan   1. Viral upper respiratory tract infection  2. Loose stools    Advising to get covid testing and to quarantine till testing comes back.  Advising to avoid family members that are immunocompromised or elderly to avoid getting them sick until his results from covid testing return.  URI- likely, viral.  Advising to take otc cough syrup.  Tyelnol/ibuprofen for myalgias.  For nausea/loose stools to take pepto-bismol and eat light -bland diet.  Increase fluids.   - f/u prn. Call or return if not improving in next 7-10 days.   Follow Up Instructions:    I discussed the assessment and treatment plan with the patient. The patient was provided an opportunity to ask questions and all were answered. The patient agreed with  the plan and demonstrated an understanding of the instructions.   The patient was advised to call back or seek an in-person evaluation if the symptoms worsen or if the condition fails to improve as anticipated.  I provided 15 minutes of non-face-to-face time during this encounter.   [place], DO

## 2019-05-14 ENCOUNTER — Telehealth: Payer: Self-pay | Admitting: Family Medicine

## 2019-05-14 NOTE — Telephone Encounter (Signed)
Pt states not having any problems but heard you should have colonoscopy at age 44 now instead of age 62.

## 2019-05-14 NOTE — Telephone Encounter (Signed)
Depends on his insurance.  Needs to call  insurance to see if he can get it at 42.  Otherwise if not having increase risk from rectal bleeding, dark stools, or family history colon cancer, then can wait till 44 yrs old.   Thanks,   Dr. Lovena Le

## 2019-05-14 NOTE — Telephone Encounter (Signed)
Patient is wondering when he was suppose to get his colonscopy done. Please advise

## 2019-05-15 ENCOUNTER — Ambulatory Visit: Payer: Self-pay

## 2019-05-15 NOTE — Telephone Encounter (Signed)
Discussed with pt and pt verbalized understanding.  °

## 2019-05-29 ENCOUNTER — Ambulatory Visit (INDEPENDENT_AMBULATORY_CARE_PROVIDER_SITE_OTHER): Payer: No Typology Code available for payment source | Admitting: Family Medicine

## 2019-05-29 ENCOUNTER — Other Ambulatory Visit: Payer: Self-pay

## 2019-05-29 VITALS — BP 120/90 | HR 102 | Temp 98.0°F | Wt 192.8 lb

## 2019-05-29 DIAGNOSIS — R1013 Epigastric pain: Secondary | ICD-10-CM | POA: Diagnosis not present

## 2019-05-29 DIAGNOSIS — K219 Gastro-esophageal reflux disease without esophagitis: Secondary | ICD-10-CM | POA: Diagnosis not present

## 2019-05-29 DIAGNOSIS — Z6379 Other stressful life events affecting family and household: Secondary | ICD-10-CM

## 2019-05-29 DIAGNOSIS — R0789 Other chest pain: Secondary | ICD-10-CM

## 2019-05-29 MED ORDER — FAMOTIDINE 40 MG PO TABS: 40.0000 mg | ORAL_TABLET | Freq: Every day | ORAL | 1 refills | Status: DC

## 2019-05-29 MED ORDER — ONDANSETRON HCL 4 MG PO TABS: 4.0000 mg | ORAL_TABLET | Freq: Three times a day (TID) | ORAL | 0 refills | Status: DC | PRN

## 2019-05-29 MED ORDER — SUCRALFATE 1 G PO TABS: 1.0000 g | ORAL_TABLET | Freq: Three times a day (TID) | ORAL | 0 refills | Status: DC

## 2019-05-29 NOTE — Progress Notes (Addendum)
Subjective:    Patient ID: [name], male    DOB: Jul 07, 1975, 44 y.o.   MRN: [id number]  HPI Patient comes in today with complaints of epigastric pain x 4 days, slight SOB when pain is bad enough. Having some pain near xiphoid process also, has mentioned this previously in 12/20. Patient reports fatigue and stress at home.  Wanting to just lay down when getting home from work.  Has had some nausea and loose stools.  Son just left for boot camp recently in last month.  Dealing with father in law that is on hospice recently.   Has been having intermittent nausea and "indigestion" feeling for over 3 wks, last appt discussed similar feelings with URI symptoms.   Was on lisinopril for HTN in past but has been off for a while, bc BP improved and was taking propanolol for migraines. Has been on propanolol and improved the migraines.  40mg  bid-propanolol.   He reports eating tomato based things recently, NSAIDs, alcohol, and caffeine. Only tried Nexium 2x.  Has tried tums.   Review of Systems  Constitutional: Positive for fatigue. Negative for chills and fever.  HENT: Negative for congestion, rhinorrhea and sore throat.   Respiratory: Negative for cough, shortness of breath and wheezing.   Cardiovascular: Negative for chest pain and leg swelling.  Gastrointestinal: Negative for abdominal pain, diarrhea, nausea and vomiting.  Genitourinary: Negative for dysuria and frequency.  Skin: Negative for rash.  Neurological: Negative for dizziness, weakness and headaches.  Psychiatric/Behavioral: The patient is nervous/anxious.    Today's Vitals   05/29/19 1531  BP: 120/90  Pulse: (!) 102  Temp: 98 F (36.7 C)  SpO2: 98%  Weight: 192 lb 12.8 oz (87.5 kg)   Body mass index is 27.47 kg/m.     Objective:   Physical Exam Vitals and nursing note reviewed.  Constitutional:      General: He is in acute distress (mild due to pain).     Appearance: Normal appearance. He is  well-developed. He is not ill-appearing.  HENT:     Head: Normocephalic.     Nose: Nose normal. No congestion.     Mouth/Throat:     Mouth: Mucous membranes are moist.     Pharynx: No oropharyngeal exudate.  Eyes:     Extraocular Movements: Extraocular movements intact.     Conjunctiva/sclera: Conjunctivae normal.     Pupils: Pupils are equal, round, and reactive to light.  Cardiovascular:     Rate and Rhythm: Normal rate and regular rhythm.     Pulses: Normal pulses.     Heart sounds: Normal heart sounds. No murmur.  Pulmonary:     Effort: Pulmonary effort is normal.     Breath sounds: Normal breath sounds. No wheezing, rhonchi or rales.  Abdominal:     General: Abdomen is flat. Bowel sounds are normal. There is no distension.     Tenderness: There is abdominal tenderness in the epigastric area. There is guarding. There is no rebound. Negative signs include Murphy's sign and McBurney's sign.     Hernia: No hernia is present.  Musculoskeletal:        General: Normal range of motion.     Right lower leg: No edema.     Left lower leg: No edema.  Skin:    General: Skin is warm and dry.     Findings: No rash.  Neurological:     General: No focal deficit present.     Mental Status:  He is alert and oriented to person, place, and time.  Psychiatric:        Mood and Affect: Mood is anxious.        Behavior: Behavior normal.        Assessment & Plan:   1. Epigastric pain - famotidine (PEPCID) 40 MG tablet; Take 1 tablet (40 mg total) by mouth daily.  Dispense: 30 tablet; Refill: 1 - ondansetron (ZOFRAN) 4 MG tablet; Take 1 tablet (4 mg total) by mouth every 8 (eight) hours as needed for nausea or vomiting.  Dispense: 20 tablet; Refill: 0 - sucralfate (CARAFATE) 1 g tablet; Take 1 tablet (1 g total) by mouth 4 (four) times daily -  with meals and at bedtime.  Dispense: 30 tablet; Refill: 0  2. Gastroesophageal reflux disease, unspecified whether esophagitis present  3. Stressful  life events affecting family and household  4. Xiphoid pain  GERD/epigastric pain- Due to cost, pt not wanting to do large work up at this time with labs and imaging.  Not wanting to be a burden to his family with medical costs etc.  Reviewed need to take care of himself also and to return if worsening.  Pt would like to try symptomatic treatment and see if improves. Very worried and stressed with home/work/and family's health issues.  From last visit on phone with me had similar nausea/indigestion feeling at that time. Recommending a trial of carafate or maalox, pepcid, and avoiding eating late, caffeine, alcohol, spicy/citrus foods, nsaids, avoid eating 2 hr prior to bedtime, or laying flat at bedtime.  Eating Bland diet for next 2 wks.  -recommending sleeping at an angle.  -zofran given for the nausea.  -stress/anxiety- pt has good support and church group that he's using as a resource.  Cont to monitor, may need counseling if not improving.  -xiphoid pain- offered xray, pt declining at this tome due to financial costs. Mentioned this back at visit on 12/20 with prior pcp.  Thought to be msk concern then.  Call or rto if worsening.  May need referral to GI for EGD, labs, xrays- if not improving.  Pt in agreement.  F/u prn.   Greater than 50% of this 30 minute face to face visit was spent in counseling and discussion and coordination of care regarding the above diagnosis/diagnosies

## 2019-05-29 NOTE — Patient Instructions (Signed)
Gastroesophageal Reflux Disease, Adult Gastroesophageal reflux (GER) happens when acid from the stomach flows up into the tube that connects the mouth and the stomach (esophagus). Normally, food travels down the esophagus and stays in the stomach to be digested. However, when a person has GER, food and stomach acid sometimes move back up into the esophagus. If this becomes a more serious problem, the person may be diagnosed with a disease called gastroesophageal reflux disease (GERD). GERD occurs when the reflux:  Happens often.  Causes frequent or severe symptoms.  Causes problems such as damage to the esophagus. When stomach acid comes in contact with the esophagus, the acid may cause soreness (inflammation) in the esophagus. Over time, GERD may create small holes (ulcers) in the lining of the esophagus. What are the causes? This condition is caused by a problem with the muscle between the esophagus and the stomach (lower esophageal sphincter, or LES). Normally, the LES muscle closes after food passes through the esophagus to the stomach. When the LES is weakened or abnormal, it does not close properly, and that allows food and stomach acid to go back up into the esophagus. The LES can be weakened by certain dietary substances, medicines, and medical conditions, including:  Tobacco use.  Pregnancy.  Having a hiatal hernia.  Alcohol use.  Certain foods and beverages, such as coffee, chocolate, onions, and peppermint. What increases the risk? You are more likely to develop this condition if you:  Have an increased body weight.  Have a connective tissue disorder.  Use NSAID medicines. What are the signs or symptoms? Symptoms of this condition include:  Heartburn.  Difficult or painful swallowing.  The feeling of having a lump in the throat.  Abitter taste in the mouth.  Bad breath.  Having a large amount of saliva.  Having an upset or bloated  stomach.  Belching.  Chest pain. Different conditions can cause chest pain. Make sure you see your health care provider if you experience chest pain.  Shortness of breath or wheezing.  Ongoing (chronic) cough or a night-time cough.  Wearing away of tooth enamel.  Weight loss. How is this diagnosed? Your health care provider will take a medical history and perform a physical exam. To determine if you have mild or severe GERD, your health care provider may also monitor how you respond to treatment. You may also have tests, including:  A test to examine your stomach and esophagus with a small camera (endoscopy).  A test thatmeasures the acidity level in your esophagus.  A test thatmeasures how much pressure is on your esophagus.  A barium swallow or modified barium swallow test to show the shape, size, and functioning of your esophagus. How is this treated? The goal of treatment is to help relieve your symptoms and to prevent complications. Treatment for this condition may vary depending on how severe your symptoms are. Your health care provider may recommend:  Changes to your diet.  Medicine.  Surgery. Follow these instructions at home: Eating and drinking   Follow a diet as recommended by your health care provider. This may involve avoiding foods and drinks such as: ? Coffee and tea (with or without caffeine). ? Drinks that containalcohol. ? Energy drinks and sports drinks. ? Carbonated drinks or sodas. ? Chocolate and cocoa. ? Peppermint and mint flavorings. ? Garlic and onions. ? Horseradish. ? Spicy and acidic foods, including peppers, chili powder, curry powder, vinegar, hot sauces, and barbecue sauce. ? Citrus fruit juices and citrus   fruits, such as oranges, lemons, and limes. ? Tomato-based foods, such as red sauce, chili, salsa, and pizza with red sauce. ? Fried and fatty foods, such as donuts, french fries, potato chips, and high-fat dressings. ? High-fat  meats, such as hot dogs and fatty cuts of red and white meats, such as rib eye steak, sausage, ham, and bacon. ? High-fat dairy items, such as whole milk, butter, and cream cheese.  Eat small, frequent meals instead of large meals.  Avoid drinking large amounts of liquid with your meals.  Avoid eating meals during the 2-3 hours before bedtime.  Avoid lying down right after you eat.  Do not exercise right after you eat. Lifestyle   Do not use any products that contain nicotine or tobacco, such as cigarettes, e-cigarettes, and chewing tobacco. If you need help quitting, ask your health care provider.  Try to reduce your stress by using methods such as yoga or meditation. If you need help reducing stress, ask your health care provider.  If you are overweight, reduce your weight to an amount that is healthy for you. Ask your health care provider for guidance about a safe weight loss goal. General instructions  Pay attention to any changes in your symptoms.  Take over-the-counter and prescription medicines only as told by your health care provider. Do not take aspirin, ibuprofen, or other NSAIDs unless your health care provider told you to do so.  Wear loose-fitting clothing. Do not wear anything tight around your waist that causes pressure on your abdomen.  Raise (elevate) the head of your bed about 6 inches (15 cm).  Avoid bending over if this makes your symptoms worse.  Keep all follow-up visits as told by your health care provider. This is important. Contact a health care provider if:  You have: ? New symptoms. ? Unexplained weight loss. ? Difficulty swallowing or it hurts to swallow. ? Wheezing or a persistent cough. ? A hoarse voice.  Your symptoms do not improve with treatment. Get help right away if you:  Have pain in your arms, neck, jaw, teeth, or back.  Feel sweaty, dizzy, or light-headed.  Have chest pain or shortness of breath.  Vomit and your vomit looks  like blood or coffee grounds.  Faint.  Have stool that is bloody or black.  Cannot swallow, drink, or eat. Summary  Gastroesophageal reflux happens when acid from the stomach flows up into the esophagus. GERD is a disease in which the reflux happens often, causes frequent or severe symptoms, or causes problems such as damage to the esophagus.  Treatment for this condition may vary depending on how severe your symptoms are. Your health care provider may recommend diet and lifestyle changes, medicine, or surgery.  Contact a health care provider if you have new or worsening symptoms.  Take over-the-counter and prescription medicines only as told by your health care provider. Do not take aspirin, ibuprofen, or other NSAIDs unless your health care provider told you to do so.  Keep all follow-up visits as told by your health care provider. This is important. This information is not intended to replace advice given to you by your health care provider. Make sure you discuss any questions you have with your health care provider. Document Revised: 07/12/2017 Document Reviewed: 07/12/2017 Elsevier Patient Education  2020 Elsevier Inc.  

## 2019-05-30 ENCOUNTER — Encounter: Payer: Self-pay | Admitting: Family Medicine

## 2019-05-30 DIAGNOSIS — Z6379 Other stressful life events affecting family and household: Secondary | ICD-10-CM

## 2019-05-30 DIAGNOSIS — K219 Gastro-esophageal reflux disease without esophagitis: Secondary | ICD-10-CM | POA: Insufficient documentation

## 2019-05-30 DIAGNOSIS — R0789 Other chest pain: Secondary | ICD-10-CM | POA: Insufficient documentation

## 2019-05-30 DIAGNOSIS — F40243 Fear of flying: Secondary | ICD-10-CM | POA: Insufficient documentation

## 2019-05-30 DIAGNOSIS — F418 Other specified anxiety disorders: Secondary | ICD-10-CM | POA: Insufficient documentation

## 2019-05-30 DIAGNOSIS — F419 Anxiety disorder, unspecified: Secondary | ICD-10-CM | POA: Insufficient documentation

## 2019-05-30 HISTORY — DX: Other stressful life events affecting family and household: Z63.79

## 2019-05-30 HISTORY — DX: Other chest pain: R07.89

## 2019-06-05 ENCOUNTER — Telehealth: Payer: Self-pay | Admitting: Family Medicine

## 2019-06-05 NOTE — Telephone Encounter (Signed)
Last two days pain in the back side and legs when go to get up or laying down is there something to recommend to do. He has been taking Ibuprofen or heat and ice and nothing is helping.

## 2019-06-05 NOTE — Telephone Encounter (Signed)
Patient notified of Dr. Forde Dandy recommendation.

## 2019-06-05 NOTE — Telephone Encounter (Signed)
Hi,  If both legs in upper thighs hurting or low back pain, then would recommend stretches and increase in walking.  He has a lot of GERD --so avoid lots of ibuprofen and use tylenol instead.   Website for stretches-  [email or web address]  If not improving in next 7-10 days then call back for appt to be seen.   Thx,   Dr. Lovena Le

## 2019-06-06 ENCOUNTER — Telehealth: Payer: Self-pay | Admitting: Family Medicine

## 2019-06-06 MED ORDER — CYCLOBENZAPRINE HCL 10 MG PO TABS: 10.0000 mg | ORAL_TABLET | Freq: Two times a day (BID) | ORAL | 0 refills | Status: DC | PRN

## 2019-06-06 NOTE — Telephone Encounter (Signed)
Pt is still having back pain where it is like grabbing or catching. He has a lot going on the weekend and he is worried about the back pain getting worse. He is wondering if there is something else recommended and feels it is muscle related and would like to know if a muscle relaxer could be sent to pharmacy to be taken until he can strengthen up his back doing the stretches and exercise that was advised.   Pt states he is still taking the tylenol and ibuprofen.   [facility] [id number] - [place], [address] AT [address]

## 2019-06-06 NOTE — Telephone Encounter (Signed)
Please advise. Thank you

## 2019-06-06 NOTE — Telephone Encounter (Signed)
Pt uses [facility] on [address]. Please advise. Thank you

## 2019-06-10 NOTE — Telephone Encounter (Signed)
Please sign if complete

## 2019-08-21 MED FILL — PROPRANOLOL 40 MG TABLET: 40 | 90 days supply | Qty: 180 | Fill #1

## 2019-08-21 MED FILL — ELETRIPTAN HYDROBROMIDE 40: 40 | 30 days supply | Qty: 10 | Fill #2

## 2019-09-06 ENCOUNTER — Encounter: Payer: Self-pay | Admitting: Emergency Medicine

## 2019-09-06 ENCOUNTER — Ambulatory Visit
Admission: EM | Admit: 2019-09-06 | Discharge: 2019-09-06 | Disposition: A | Discharge status: Home / Self Care | Payer: No Typology Code available for payment source | Attending: Family Medicine | Admitting: Family Medicine

## 2019-09-06 DIAGNOSIS — Z1152 Encounter for screening for COVID-19: Secondary | ICD-10-CM

## 2019-09-06 DIAGNOSIS — R05 Cough: Secondary | ICD-10-CM

## 2019-09-06 DIAGNOSIS — B349 Viral infection, unspecified: Secondary | ICD-10-CM

## 2019-09-06 DIAGNOSIS — R5383 Other fatigue: Secondary | ICD-10-CM | POA: Diagnosis not present

## 2019-09-06 DIAGNOSIS — R062 Wheezing: Secondary | ICD-10-CM

## 2019-09-06 DIAGNOSIS — R531 Weakness: Secondary | ICD-10-CM | POA: Diagnosis not present

## 2019-09-06 DIAGNOSIS — R059 Cough, unspecified: Secondary | ICD-10-CM

## 2019-09-06 DIAGNOSIS — R6883 Chills (without fever): Secondary | ICD-10-CM

## 2019-09-06 DIAGNOSIS — R52 Pain, unspecified: Secondary | ICD-10-CM

## 2019-09-06 DIAGNOSIS — R0981 Nasal congestion: Secondary | ICD-10-CM

## 2019-09-06 DIAGNOSIS — Z20822 Contact with and (suspected) exposure to covid-19: Secondary | ICD-10-CM

## 2019-09-06 MED ORDER — BENZONATATE 100 MG PO CAPS: 100.0000 mg | ORAL_CAPSULE | Freq: Three times a day (TID) | ORAL | 0 refills | Status: DC

## 2019-09-06 MED ORDER — ALBUTEROL SULFATE HFA 108 (90 BASE) MCG/ACT IN AERS: 2.0000 | INHALATION_SPRAY | RESPIRATORY_TRACT | 0 refills | Status: DC | PRN

## 2019-09-06 MED ORDER — PREDNISONE 10 MG (21) PO TBPK: ORAL_TABLET | Freq: Every day | ORAL | 0 refills | Status: AC

## 2019-09-06 MED ORDER — DEXAMETHASONE SODIUM PHOSPHATE 10 MG/ML IJ SOLN
10.0000 mg | Freq: Once | INTRAMUSCULAR | Status: AC
  Administered 2019-09-06: 10 mg via INTRAMUSCULAR

## 2019-09-06 NOTE — Discharge Instructions (Addendum)
Your COVID test is pending.  You should self quarantine until the test result is back.    Take Tylenol as needed for fever or discomfort.  Rest and keep yourself hydrated.    Go to the emergency department if you develop acute worsening symptoms.     

## 2019-09-06 NOTE — ED Triage Notes (Signed)
Cough , fatigue and sore throat

## 2019-09-06 NOTE — ED Provider Notes (Signed)
[place]   [id number] 09/06/19 Arrival Time: 3016   CC: COVID symptoms  SUBJECTIVE: History from: patient.  [name] is a 44 y.o. male who presents with abrupt onset of nasal congestion, PND, chills, body aches, sore throat fever, fatigue, chest burning, headache and persistent dry cough for 3 days. Reports that his mother in law is ill.  Denies recent travel. Has negative history of Covid. Has received one dose of Covid vaccines. Has taken OTC medications for this. There are no aggravating or alleviating factors. Denies previous symptoms in the past. Denies sinus pain, rhinorrhea,  SOB, wheezing,  nausea, changes in bowel or bladder habits.    ROS: As per HPI.  All other pertinent ROS negative.     Past Medical History:  Diagnosis Date  . Allergy   . Anxiety   . Chronic neck pain   . Chronic pain   . DDD (degenerative disc disease)   . Herniated cervical disc   . Hypertension   . IBS (irritable bowel syndrome)   . Migraines    since 2002  . Renal calculi   . Shingles   . Sleep apnea    does not wear c-pap   Past Surgical History:  Procedure Laterality Date  . ANTERIOR CERVICAL DECOMP/DISCECTOMY FUSION N/A 08/15/2013   Procedure: Anterior Cervical Four-Five/Five-Six/Six-Seven Decompression and Fusion;  Surgeon: Eustace Moore, MD;  Location: [facility];  Service: Neurosurgery;  Laterality: N/A;  . LITHOTRIPSY    . WISDOM TOOTH EXTRACTION     Allergies  Allergen Reactions  . Erythromycin Nausea And Vomiting   No current facility-administered medications on file prior to encounter.   Current Outpatient Medications on File Prior to Encounter  Medication Sig Dispense Refill  . cetirizine (ZYRTEC) 10 MG tablet Take 10 mg by mouth daily.    . cyclobenzaprine (FLEXERIL) 10 MG tablet Take 1 tablet (10 mg total) by mouth 2 (two) times daily as needed for muscle spasms. Caution with driving, causes sedation. 15 tablet 0  . eletriptan (RELPAX) 40 MG tablet  TAKE 1 TABLET BY MOUTH AS NEEDED FOR MIGRAINE. MAY REPEAT IN 2 HOURS IF NECESSARY AS NEEDED FOR MIGRAINES. MAX 2 TAB/24 HRS. 10 tablet 3  . famotidine (PEPCID) 40 MG tablet Take 1 tablet (40 mg total) by mouth daily. 30 tablet 1  . ondansetron (ZOFRAN) 4 MG tablet Take 1 tablet (4 mg total) by mouth every 8 (eight) hours as needed for nausea or vomiting. 20 tablet 0  . OVER THE COUNTER MEDICATION Fish oil    . propranolol (INDERAL) 40 MG tablet Take 1 tablet (40 mg total) by mouth 2 (two) times daily. 180 tablet 1  . sucralfate (CARAFATE) 1 g tablet Take 1 tablet (1 g total) by mouth 4 (four) times daily -  with meals and at bedtime. 30 tablet 0   Social History   Socioeconomic History  . Marital status: Married    Spouse name: Anderson Malta  . Number of children: 3  . Years of education: Not on file  . Highest education level: Not on file  Occupational History    Employer: [employer]  Tobacco Use  . Smoking status: Never Smoker  . Smokeless tobacco: Never Used  Substance and Sexual Activity  . Alcohol use: No    Comment: rarely  . Drug use: No  . Sexual activity: Not on file  Other Topics Concern  . Not on file  Social History Narrative   Caffeine-Pt drinks 2  cups of caffeine daily.   Social Determinants of Health   Financial Resource Strain:   . Difficulty of Paying Living Expenses: Not on file  Food Insecurity:   . Worried About Charity fundraiser in the Last Year: Not on file  . Ran Out of Food in the Last Year: Not on file  Transportation Needs:   . Lack of Transportation (Medical): Not on file  . Lack of Transportation (Non-Medical): Not on file  Physical Activity:   . Days of Exercise per Week: Not on file  . Minutes of Exercise per Session: Not on file  Stress:   . Feeling of Stress : Not on file  Social Connections:   . Frequency of Communication with Friends and Family: Not on file  . Frequency of Social Gatherings with Friends and Family: Not on file  . Attends  Religious Services: Not on file  . Active Member of Clubs or Organizations: Not on file  . Attends Archivist Meetings: Not on file  . Marital Status: Not on file  Intimate Partner Violence:   . Fear of Current or Ex-Partner: Not on file  . Emotionally Abused: Not on file  . Physically Abused: Not on file  . Sexually Abused: Not on file   Family History  Problem Relation Age of Onset  . Headache Mother   . Prostate cancer Father   . Kidney cancer Father   . Kidney Stones Sister   . Migraines Sister     OBJECTIVE:  Vitals:   09/06/19 1224 09/06/19 1225  BP:  (!) 148/102  Pulse:  61  Resp:  19  Temp:  99 F (37.2 C)  TempSrc:  Oral  SpO2:  100%  Weight: 190 lb (86.2 kg)   Height: 5\' 10"  (1.778 m)      General appearance: alert; appears fatigued, but nontoxic; speaking in full sentences and tolerating own secretions HEENT: NCAT; Ears: EACs clear, TMs pearly gray; Eyes: PERRL.  EOM grossly intact. Sinuses: nontender; Nose: nares patent without rhinorrhea, Throat: oropharynx clear, tonsils non erythematous or enlarged, uvula midline  Neck: supple without LAD Lungs: unlabored respirations, symmetrical air entry; cough: mild; no respiratory distress; CTAB Heart: regular rate and rhythm.  Radial pulses 2+ symmetrical bilaterally Skin: warm and dry Psychological: alert and cooperative; normal mood and affect  LABS:  No results found for this or any previous visit (from the past 24 hour(s)).   ASSESSMENT & PLAN:  1. Viral illness   2. Cough   3. Other fatigue   4. Weakness   5. Nasal congestion   6. Chills   7. Body aches   8. Encounter for screening for COVID-19   9. Close exposure to COVID-19 virus   10. Wheezing     Meds ordered this encounter  Medications  . DISCONTD: albuterol (VENTOLIN HFA) 108 (90 Base) MCG/ACT inhaler    Sig: Inhale 2 puffs into the lungs every 4 (four) hours as needed for wheezing or shortness of breath.    Dispense:  18 g     Refill:  0    Order Specific Question:   Supervising Provider    Answer:   Chase Picket [id number]  . DISCONTD: benzonatate (TESSALON) 100 MG capsule    Sig: Take 1 capsule (100 mg total) by mouth every 8 (eight) hours.    Dispense:  21 capsule    Refill:  0    Order Specific Question:   Supervising Provider  AnswerChase Picket [id number]  . albuterol (VENTOLIN HFA) 108 (90 Base) MCG/ACT inhaler    Sig: Inhale 2 puffs into the lungs every 4 (four) hours as needed for wheezing or shortness of breath.    Dispense:  18 g    Refill:  0    Order Specific Question:   Supervising Provider    Answer:   Chase Picket [id number]  . benzonatate (TESSALON) 100 MG capsule    Sig: Take 1 capsule (100 mg total) by mouth every 8 (eight) hours.    Dispense:  21 capsule    Refill:  0    Order Specific Question:   Supervising Provider    Answer:   Chase Picket [id number]  . predniSONE (STERAPRED UNI-PAK 21 TAB) 10 MG (21) TBPK tablet    Sig: Take by mouth daily for 6 days. Take 6 tablets on day 1, 5 tablets on day 2, 4 tablets on day 3, 3 tablets on day 4, 2 tablets on day 5, 1 tablet on day 6    Dispense:  21 tablet    Refill:  0    Order Specific Question:   Supervising Provider    Answer:   Chase Picket [id number]  . dexamethasone (DECADRON) injection 10 mg    Decadron 10mg  IM in office today Prescribed albuterol  Prescribed tessalon perles Prescribed prednisone taper  Very likely Covid with presentation of symptoms and close proximity with ill family member COVID testing ordered.  It will take between 1-2 days for test results.  Someone will contact you regarding abnormal results.    Patient should remain in quarantine until they have received Covid results.  If negative you may resume normal activities (go back to work/school) while practicing hand hygiene, social distance, and mask wearing.  If positive, patient should remain in quarantine for 10 days from symptom  onset AND greater than 72 hours after symptoms resolution (absence of fever without the use of fever-reducing medication and improvement in respiratory symptoms), whichever is longer Get plenty of rest and push fluids Use OTC zyrtec for nasal congestion, runny nose, and/or sore throat Use OTC flonase for nasal congestion and runny nose Use medications daily for symptom relief Use OTC medications like ibuprofen or tylenol as needed fever or pain Call or go to the ED if you have any new or worsening symptoms such as fever, worsening cough, shortness of breath, chest tightness, chest pain, turning blue, changes in mental status.  Reviewed expectations re: course of current medical issues. Questions answered. Outlined signs and symptoms indicating need for more acute intervention. Patient verbalized understanding. After Visit Summary given.         Faustino Congress, NP 09/07/19 1011

## 2019-09-07 LAB — NOVEL CORONAVIRUS, NAA: SARS-CoV-2, NAA: NOT DETECTED

## 2019-09-07 LAB — SARS-COV-2, NAA 2 DAY TAT

## 2019-09-09 ENCOUNTER — Ambulatory Visit (INDEPENDENT_AMBULATORY_CARE_PROVIDER_SITE_OTHER): Payer: No Typology Code available for payment source | Admitting: Family Medicine

## 2019-09-09 ENCOUNTER — Other Ambulatory Visit: Payer: Self-pay

## 2019-09-09 DIAGNOSIS — R05 Cough: Secondary | ICD-10-CM | POA: Diagnosis not present

## 2019-09-09 DIAGNOSIS — J029 Acute pharyngitis, unspecified: Secondary | ICD-10-CM

## 2019-09-09 DIAGNOSIS — R059 Cough, unspecified: Secondary | ICD-10-CM

## 2019-09-09 LAB — POCT RAPID STREP A (OFFICE): Rapid Strep A Screen: NEGATIVE

## 2019-09-09 NOTE — Progress Notes (Signed)
   Subjective:    Patient ID: [name], male    DOB: 06-Jun-1975, 44 y.o.   MRN: [id number]  Cough This is a new problem. Episode onset: 5 days. Associated symptoms comments: Congestion, chills, fatigue, aches, chest tightness.   covid test on 8/20 was negative patient relates some sore throat some body aches denies nausea vomiting PMH benign   Review of Systems  Respiratory: Positive for cough.   Please see above     Objective:   Physical Exam Throat erythematous neck is supple lungs clear heart regular HEENT benign otherwise no respiratory distress  O2 saturation 96%     Assessment & Plan:  Viral syndrome Supportive measures discussed Follow-up if progressive troubles or worse Recheck if any issues Covid test taken Strep test taken Await the results Rapid strep negative warnings were discussed

## 2019-09-10 ENCOUNTER — Telehealth: Payer: Self-pay | Admitting: Family Medicine

## 2019-09-10 NOTE — Telephone Encounter (Signed)
Patient calling to check on covid and strep test results.

## 2019-09-10 NOTE — Telephone Encounter (Signed)
Pt contacted and verbalized understanding.  

## 2019-09-10 NOTE — Telephone Encounter (Signed)
Please let pt know and we will check daily (and will be posted via my chart as well)

## 2019-09-11 LAB — SARS-COV-2, NAA 2 DAY TAT

## 2019-09-11 LAB — CULTURE, GROUP A STREP: Strep A Culture: NEGATIVE

## 2019-09-11 LAB — SPECIMEN STATUS REPORT

## 2019-09-11 LAB — NOVEL CORONAVIRUS, NAA: SARS-CoV-2, NAA: NOT DETECTED

## 2019-09-12 ENCOUNTER — Telehealth: Payer: Self-pay | Admitting: Family Medicine

## 2019-09-12 NOTE — Telephone Encounter (Signed)
Patient states not any better still has cough, no energy finished up prednisone and tessalon pills not helping. Please advise

## 2019-09-12 NOTE — Telephone Encounter (Signed)
Patient saw Dr. Nicki Reaper 8/23.

## 2019-09-12 NOTE — Telephone Encounter (Signed)
Needs car exam if not improving. Thx. Dr. Lovena Le

## 2019-09-13 ENCOUNTER — Ambulatory Visit (HOSPITAL_COMMUNITY)
Admission: RE | Admit: 2019-09-13 | Discharge: 2019-09-13 | Disposition: A | Discharge status: Home / Self Care | Payer: No Typology Code available for payment source | Source: Ambulatory Visit | Attending: Family Medicine | Admitting: Family Medicine

## 2019-09-13 ENCOUNTER — Telehealth: Payer: Self-pay | Admitting: Family Medicine

## 2019-09-13 ENCOUNTER — Other Ambulatory Visit: Payer: Self-pay

## 2019-09-13 ENCOUNTER — Ambulatory Visit (INDEPENDENT_AMBULATORY_CARE_PROVIDER_SITE_OTHER): Payer: No Typology Code available for payment source | Admitting: Family Medicine

## 2019-09-13 DIAGNOSIS — R079 Chest pain, unspecified: Secondary | ICD-10-CM | POA: Diagnosis not present

## 2019-09-13 DIAGNOSIS — J029 Acute pharyngitis, unspecified: Secondary | ICD-10-CM

## 2019-09-13 DIAGNOSIS — R05 Cough: Secondary | ICD-10-CM

## 2019-09-13 DIAGNOSIS — R059 Cough, unspecified: Secondary | ICD-10-CM

## 2019-09-13 MED ORDER — AMOXICILLIN 500 MG PO CAPS: 500.0000 mg | ORAL_CAPSULE | Freq: Three times a day (TID) | ORAL | 0 refills | Status: DC

## 2019-09-13 NOTE — Progress Notes (Signed)
   Subjective:    Patient ID: [name], male    DOB: Dec 16, 1975, 44 y.o.   MRN: [id number]  HPI Pt here follow up on cough. Pt was seen recently. Negative step and covid. Pt still having cough, chest pain from coughing, uneasiness with stomach, rattling in chest. Pt has been on steroid and cough med.   Patient relates a lot of soreness in his chest from coughing denies any severe shortness of breath relates a lot of fatigue and tiredness feeling rundown muscle aches not feeling good. Review of Systems  Constitutional: Negative for activity change, chills and fever.  HENT: Positive for congestion, rhinorrhea and sore throat. Negative for ear pain.   Eyes: Negative for discharge.  Respiratory: Positive for cough and chest tightness. Negative for wheezing.   Cardiovascular: Positive for chest pain.  Gastrointestinal: Negative for nausea and vomiting.  Musculoskeletal: Negative for arthralgias.       Objective:   Physical Exam He does not appear toxic but certainly he has been sick for a while eardrums are normal throat is erythematous bilateral with some exudate no adenopathy lungs are clear no crackles heart regular  Stat chest x-ray does not show pneumonia     Assessment & Plan:  Viral syndrome Prolonged nature of this does raise possibility of Covid that just was not detected by the lab tests.  He is to stay at home through the weekend and give [place] update on Monday Antibiotics prescribed for his pharyngitis Stat chest x-ray did not show pneumonia this was relayed to the patient via message on his phone plus also MyChart message

## 2019-09-13 NOTE — Telephone Encounter (Signed)
Patient called in checking on message from yesterday and FMLA.  He said that as far as he knows he is suppose to return to work on Tuesday 8/31 if he is getting better.  Still having a lot of coughing. Said the cough felt really deep and his chest hurts. Covid test negative.  Per message from Dr. Lovena Le yesterday, patient needed to be rechecked.  Patient scheduled for car visit today at 4 with DR. Scott.    ** Also FMLA forms were faxed over on Wednesday and put in Dr. Bary Leriche folder.

## 2019-09-16 ENCOUNTER — Telehealth: Payer: Self-pay | Admitting: Family Medicine

## 2019-09-16 MED ORDER — LEVOFLOXACIN 500 MG PO TABS: 500.0000 mg | ORAL_TABLET | Freq: Every day | ORAL | 0 refills | Status: DC

## 2019-09-16 NOTE — Telephone Encounter (Signed)
Patient stated she would like to try to go back this week. Patient also stated as a FYI: Patient's daughter was sent home from school this am with a covid exposure( masks are optional at her middle school) she was not beside the person but in the room and she has to quarantine for 14 days- does that affect him returning. He stated is is starting to feel some better and was planning on trying to return this week- Please advise

## 2019-09-16 NOTE — Telephone Encounter (Signed)
Wants to go back to work Wednesday.  Checking on previous message.

## 2019-09-16 NOTE — Telephone Encounter (Signed)
FMLA was filled out patient may return to work on Wednesday but there are a couple administrative parts that need to be filled into the FMLA where the circles are please see please do then handle the FMLA-fax it were given to the patient etc. thank you

## 2019-09-16 NOTE — Telephone Encounter (Signed)
Nurses May switch antibiotics to Levaquin 500 mg 1 daily for the next 7 days As for how he is doing realistically does he feel that he can go back to work this week? I will fill out the FMLA form at lunchtime Thank you

## 2019-09-16 NOTE — Telephone Encounter (Signed)
Pt called to let Dr know he has deep cough, congestion still throat still swollen. Also there is FMLA form that was dropped off last Tuesday needs form for work. Pt needs to let supervisor know today.   Pt call back [phone]

## 2019-09-16 NOTE — Telephone Encounter (Signed)
May pick up FMLA in the morning or we can fax it

## 2019-09-17 ENCOUNTER — Ambulatory Visit (INDEPENDENT_AMBULATORY_CARE_PROVIDER_SITE_OTHER): Payer: No Typology Code available for payment source | Admitting: Family Medicine

## 2019-09-17 ENCOUNTER — Encounter: Payer: Self-pay | Admitting: Family Medicine

## 2019-09-17 DIAGNOSIS — Z20822 Contact with and (suspected) exposure to covid-19: Secondary | ICD-10-CM | POA: Diagnosis not present

## 2019-09-17 DIAGNOSIS — J029 Acute pharyngitis, unspecified: Secondary | ICD-10-CM

## 2019-09-17 DIAGNOSIS — R05 Cough: Secondary | ICD-10-CM | POA: Diagnosis not present

## 2019-09-17 DIAGNOSIS — R059 Cough, unspecified: Secondary | ICD-10-CM

## 2019-09-17 NOTE — Progress Notes (Signed)
   Subjective:    Patient ID: [name], male    DOB: 1975-05-12, 44 y.o.   MRN: [id number]  HPI  Patient arrives with continued cough, congestion, body aches and sore throat. Patient with ongoing sore throat head congestion body aches not feeling good has had a chest x-ray which is negative had Covid test was ordered was negative has ongoing low energy not feeling good feels get out and moves around [place] benign  Review of Systems Please see above    Objective:   Physical Exam  Patient looks to feel ill but does not appear toxic throat is normal mucous membranes moist lungs are clear heart is regular extremities no edema skin warm dry  O2 saturation 96%    Assessment & Plan:  Viral syndrome Cannot rule out Covid definitively Patient was placed on Levaquin for possible secondary sinus infection Patient is in no way shape or form able to go back to work currently hopefully able to go back to work by Sunday.  If he gets worse I recommend additional testing including labs FMLA was filled out If any ongoing troubles notify us

## 2019-09-18 ENCOUNTER — Ambulatory Visit: Payer: No Typology Code available for payment source | Admitting: Family Medicine

## 2019-09-19 ENCOUNTER — Telehealth: Payer: Self-pay | Admitting: Family Medicine

## 2019-09-19 DIAGNOSIS — Z029 Encounter for administrative examinations, unspecified: Secondary | ICD-10-CM

## 2019-09-19 NOTE — Telephone Encounter (Signed)
Form filled out as best I could See form needs some input from the patient

## 2019-09-19 NOTE — Telephone Encounter (Signed)
Patient had [employer] fax over short-term disability to be completed in your folder.

## 2019-09-20 ENCOUNTER — Encounter: Payer: Self-pay | Admitting: Family Medicine

## 2019-09-20 NOTE — Telephone Encounter (Signed)
Front  Please give him a work note through Monday with return to work on Tuesday Please forward the following message to [employer]  Sorry you are feeling so bad. I would not recommend adding additional antibiotics at this time. Obviously if your energy level is still very low it would not be wise to return to work.  Hopefully by Tuesday you can go back to work. If you find you are unable to return to work on Tuesday then I would recommend a follow-up next week for recheck.  I believe that you do have an underlying virus that is triggering this-obviously if you get worse with fever difficult breathing would be necessary to be seen sooner  Please keep [place] posted and follow-up if ongoing troubles Thanks-Dr. Nicki Reaper

## 2019-09-26 NOTE — Telephone Encounter (Signed)
Form has been faxed and mailed to patient.

## 2019-09-30 ENCOUNTER — Encounter: Payer: Self-pay | Admitting: Family Medicine

## 2019-09-30 NOTE — Telephone Encounter (Signed)
Erica see below about forms. I called pt and scheduled his son an appt for today and pt declined appt. States he is feeling better just wonders if he needs to be doing anything else about the cough. No sob. Tried tessalon pearls.

## 2019-10-11 ENCOUNTER — Encounter: Payer: Self-pay | Admitting: Family Medicine

## 2019-11-27 ENCOUNTER — Ambulatory Visit: Payer: No Typology Code available for payment source | Admitting: Dermatology

## 2019-12-30 ENCOUNTER — Other Ambulatory Visit: Payer: Self-pay

## 2019-12-30 ENCOUNTER — Telehealth: Payer: Self-pay

## 2019-12-30 ENCOUNTER — Encounter: Payer: Self-pay | Admitting: Podiatry

## 2019-12-30 ENCOUNTER — Ambulatory Visit (INDEPENDENT_AMBULATORY_CARE_PROVIDER_SITE_OTHER): Payer: No Typology Code available for payment source | Admitting: Podiatry

## 2019-12-30 ENCOUNTER — Other Ambulatory Visit: Payer: Self-pay | Admitting: *Deleted

## 2019-12-30 DIAGNOSIS — L600 Ingrowing nail: Secondary | ICD-10-CM | POA: Diagnosis not present

## 2019-12-30 NOTE — Telephone Encounter (Signed)
Please advise if ok to enter referral.

## 2019-12-30 NOTE — Telephone Encounter (Signed)
Ok, pls send referral. Thx. Dr. Lovena Le

## 2019-12-30 NOTE — Patient Instructions (Signed)

## 2019-12-30 NOTE — Telephone Encounter (Signed)
Referral in epic

## 2019-12-30 NOTE — Telephone Encounter (Signed)
Patient is going to Podiatrist today to fix an ingrown toenail and forgot to get a referral for [name].  He has down his online part he just needs the actual referral to be put in the system for documentation.   Podiatrist   [name] in [place] at [facility]

## 2020-01-01 NOTE — Progress Notes (Signed)
Subjective:   Patient ID: [name], male   DOB: 43 y.o.   MRN: [id number]   HPI Patient presents stating he has incurvated nailbeds of the hallux bilateral with small spicules on the medial border bilateral that are bothersome for him   ROS      Objective:  Physical Exam  Neurovascular status intact with patient found to have incurvated hallux nail borders bilateral medial side that get irritated and discomforting     Assessment:  Ingrown toenail deformity hallux bilateral medial borders     Plan:  H&P reviewed condition recommended removal of the borders explaining procedure and risk.  Patient wants procedure today I infiltrated each hallux 60 mg Xylocaine Marcaine mixture sterile prep done and using sterile instrumentation remove the medial borders exposed matrix applied phenol 3 applications 30 seconds followed by alcohol lavage and sterile dressing at the reconstructions on soaks.  Patient will be seen back to recheck and is encouraged to leave dressings on 24 hours but take them off earlier if any throbbing were to occur and call with any questions concerns which may arise

## 2020-01-27 ENCOUNTER — Telehealth: Payer: Self-pay | Admitting: *Deleted

## 2020-01-30 ENCOUNTER — Other Ambulatory Visit: Payer: Self-pay | Admitting: Family Medicine

## 2020-01-30 ENCOUNTER — Telehealth: Payer: Self-pay | Admitting: Family Medicine

## 2020-01-30 MED ORDER — PROPRANOLOL HCL 40 MG PO TABS: 40.0000 mg | ORAL_TABLET | Freq: Two times a day (BID) | ORAL | 1 refills | Status: DC

## 2020-01-30 MED FILL — PROPRANOLOL 40 MG TABLET: 40 | 90 days supply | Qty: 180 | Fill #0

## 2020-01-30 MED FILL — ELETRIPTAN HYDROBROMIDE 40: 40 | 30 days supply | Qty: 10 | Fill #3

## 2020-01-30 NOTE — Telephone Encounter (Signed)
Pt contacted and verbalized understanding.  

## 2020-01-30 NOTE — Addendum Note (Signed)
Addended by: Erven Colla on: 01/30/2020 11:34 AM   Modules accepted: Orders

## 2020-01-30 NOTE — Telephone Encounter (Signed)
Pt needing refill on Propranolol 40 mg BID. Pt has scheduled physical appt. Pt has 6 pills left at this time. Please advise. Thank you.  [name]

## 2020-01-30 NOTE — Telephone Encounter (Signed)
done

## 2020-02-10 ENCOUNTER — Encounter: Payer: Self-pay | Admitting: Family Medicine

## 2020-02-10 ENCOUNTER — Telehealth: Payer: No Typology Code available for payment source | Admitting: Family Medicine

## 2020-02-10 ENCOUNTER — Telehealth: Payer: Self-pay | Admitting: *Deleted

## 2020-02-10 DIAGNOSIS — J029 Acute pharyngitis, unspecified: Secondary | ICD-10-CM | POA: Diagnosis not present

## 2020-02-10 DIAGNOSIS — U071 COVID-19: Secondary | ICD-10-CM | POA: Diagnosis not present

## 2020-02-10 DIAGNOSIS — R5381 Other malaise: Secondary | ICD-10-CM

## 2020-02-10 DIAGNOSIS — R5383 Other fatigue: Secondary | ICD-10-CM

## 2020-02-10 NOTE — Telephone Encounter (Signed)
Reviewed the e-visit.  Sounds like he is improving.  Not in the contagious phase and may return to work.  Treating his throat pain/achiness with tylenol or ibuprofen is fine. Increase in fluids.  Not additional medication recommended at this time for viral covid illness. The fatigue and low energy can last a few weeks, but pt is able to return to work on 02/11/20.    Let me know if he is having any other concerns.   Dr. Lovena Le

## 2020-02-10 NOTE — Telephone Encounter (Signed)
Patient left message stating he tested positive for Covid last Saturday 02/01/20. Patient states he is still having fatigue and not feeling well and has not been able to return to work. Patient just had e visit thru my chart and was advised to use Tylenol and he may return to work tomorrow after a 10 day quarantine. Patient wants to know if there is medicine or anything else he can do to feel better since he is still not having much energy and achy

## 2020-02-10 NOTE — Telephone Encounter (Signed)
Patient advised per Dr Lovena Le: Reviewed the e-visit.  Sounds like he is improving.  Not in the contagious phase and may return to work.  Treating his throat pain/achiness with tylenol or ibuprofen is fine. Increase in fluids.   Not additional medication recommended at this time for viral covid illness. The fatigue and low energy can last a few weeks, but pt is able to return to work on 02/11/20.    Patient verbalized understanding.

## 2020-02-10 NOTE — Patient Instructions (Signed)
Thanks for letting us be part of your care.  Please continue supportive care as discussed.  You and I discussed CDC guidelines at length.  Recommend that you return to work on 02/11/2020.  Also, a note has been written that is available via MyChart.   COVID-19: What to Do if You Are Sick If you have a fever, cough or other symptoms, you might have COVID-19. Most people have mild illness and are able to recover at home. If you are sick:  Keep track of your symptoms.  If you have an emergency warning sign (including trouble breathing), call 911. Steps to help prevent the spread of COVID-19 if you are sick If you are sick with COVID-19 or think you might have COVID-19, follow the steps below to care for yourself and to help protect other people in your home and community. Stay home except to get medical care  Stay home. Most people with COVID-19 have mild illness and can recover at home without medical care. Do not leave your home, except to get medical care. Do not visit public areas.  Take care of yourself. Get rest and stay hydrated. Take over-the-counter medicines, such as acetaminophen, to help you feel better.  Stay in touch with your doctor. Call before you get medical care. Be sure to get care if you have trouble breathing, or have any other emergency warning signs, or if you think it is an emergency.  Avoid public transportation, ride-sharing, or taxis. Separate yourself from other people As much as possible, stay in a specific room and away from other people and pets in your home. If possible, you should use a separate bathroom. If you need to be around other people or animals in or outside of the home, wear a mask. Tell your close contactsthat they may have been exposed to COVID-19. An infected person can spread COVID-19 starting 48 hours (or 2 days) before the person has any symptoms or tests positive. By letting your close contacts know they may have been exposed to COVID-19, you are  helping to protect everyone.  Additional guidance is available for those living in close quarters and shared housing.  See COVID-19 and Animals if you have questions about pets.  If you are diagnosed with COVID-19, someone from the health department may call you. Answer the call to slow the spread. Monitor your symptoms  Symptoms of COVID-19 include fever, cough, or other symptoms.  Follow care instructions from your healthcare provider and local health department. Your local health authorities may give instructions on checking your symptoms and reporting information. When to seek emergency medical attention Look for emergency warning signs* for COVID-19. If someone is showing any of these signs, seek emergency medical care immediately:  Trouble breathing  Persistent pain or pressure in the chest  New confusion  Inability to wake or stay awake  Pale, gray, or blue-colored skin, lips, or nail beds, depending on skin tone *This list is not all possible symptoms. Please call your medical provider for any other symptoms that are severe or concerning to you. Call 911 or call ahead to your local emergency facility: Notify the operator that you are seeking care for someone who has or may have COVID-19. Call ahead before visiting your doctor  Call ahead. Many medical visits for routine care are being postponed or done by phone or telemedicine.  If you have a medical appointment that cannot be postponed, call your doctor's office, and tell them you have or may have COVID-19.  This will help the office protect themselves and other patients. Get  tested  If you have symptoms of COVID-19, get tested. While waiting for test results, you stay away from others, including staying apart from those living in your household.  You can visit your state, tribal, local, and territorialhealth department's website to look for the latest local information on testing sites. If you are sick, wear a mask over  your nose and mouth  You should wear a mask over your nose and mouth if you must be around other people or animals, including pets (even at home).  You don't need to wear the mask if you are alone. If you can't put on a mask (because of trouble breathing, for example), cover your coughs and sneezes in some other way. Try to stay at least 6 feet away from other people. This will help protect the people around you.  Masks should not be placed on young children under age 90 years, anyone who has trouble breathing, or anyone who is not able to remove the mask without help. Note: During the COVID-19 pandemic, medical grade facemasks are reserved for healthcare workers and some first responders. Cover your coughs and sneezes  Cover your mouth and nose with a tissue when you cough or sneeze.  Throw away used tissues in a lined trash can.  Immediately wash your hands with soap and water for at least 20 seconds. If soap and water are not available, clean your hands with an alcohol-based hand sanitizer that contains at least 60% alcohol. Clean your hands often  Wash your hands often with soap and water for at least 20 seconds. This is especially important after blowing your nose, coughing, or sneezing; going to the bathroom; and before eating or preparing food.  Use hand sanitizer if soap and water are not available. Use an alcohol-based hand sanitizer with at least 60% alcohol, covering all surfaces of your hands and rubbing them together until they feel dry.  Soap and water are the best option, especially if hands are visibly dirty.  Avoid touching your eyes, nose, and mouth with unwashed hands.  Handwashing Tips Avoid sharing personal household items  Do not share dishes, drinking glasses, cups, eating utensils, towels, or bedding with other people in your home.  Wash these items thoroughly after using them with soap and water or put in the dishwasher. Clean all "high-touch" surfaces  everyday  Clean and disinfect high-touch surfaces in your "sick room" and bathroom; wear disposable gloves. Let someone else clean and disinfect surfaces in common areas, but you should clean your bedroom and bathroom, if possible.  If a caregiver or other person needs to clean and disinfect a sick person's bedroom or bathroom, they should do so on an as-needed basis. The caregiver/other person should wear a mask and disposable gloves prior to cleaning. They should wait as long as possible after the person who is sick has used the bathroom before coming in to clean and use the bathroom. ? High-touch surfaces include phones, remote controls, counters, tabletops, doorknobs, bathroom fixtures, toilets, keyboards, tablets, and bedside tables.  Clean and disinfect areas that may have blood, stool, or body fluids on them.  Use household cleaners and disinfectants. Clean the area or item with soap and water or another detergent if it is dirty. Then, use a household disinfectant. ? Be sure to follow the instructions on the label to ensure safe and effective use of the product. Many products recommend keeping the surface wet  for several minutes to ensure germs are killed. Many also recommend precautions such as wearing gloves and making sure you have good ventilation during use of the product. ? Use a product from [employer] List N: Disinfectants for Coronavirus (ZPHXT-05). ? Complete Disinfection Guidance When you can be around others after being sick with COVID-19 Deciding when you can be around others is different for different situations. Find out when you can safely end home isolation. For any additional questions about your care, contact your healthcare provider or state or local health department. 04/03/2019 Content source: [facility] ([facility]), Division of Viral Diseases This information is not intended to replace advice given to you by your health care  provider. Make sure you discuss any questions you have with your health care provider. Document Revised: 11/18/2019 Document Reviewed: 11/18/2019 Elsevier Patient Education  2021 [employer].

## 2020-02-10 NOTE — Progress Notes (Signed)
[name], silverio are scheduled for a virtual visit with your provider today.    Just as we do with appointments in the office, we must obtain your consent to participate.  Your consent will be active for this visit and any virtual visit you may have with one of our providers in the next 365 days.    If you have a MyChart account, I can also send a copy of this consent to you electronically.  All virtual visits are billed to your insurance company just like a traditional visit in the office.  As this is a virtual visit, video technology does not allow for your provider to perform a traditional examination.  This may limit your provider's ability to fully assess your condition.  If your provider identifies any concerns that need to be evaluated in person or the need to arrange testing such as labs, EKG, etc, we will make arrangements to do so.    Although advances in technology are sophisticated, we cannot ensure that it will always work on either your end or our end.  If the connection with a video visit is poor, we may have to switch to a telephone visit.  With either a video or telephone visit, we are not always able to ensure that we have a secure connection.   I need to obtain your verbal consent now.   Are you willing to proceed with your visit today?   [name] has provided verbal consent on 02/10/2020 for a virtual visit (video or telephone).  Virtual Visit via Video Note  I connected with [name] on 02/10/20 at 11:45 AM EST by a video enabled telemedicine application and verified that I am speaking with the correct person using two identifiers.  Location: Patient: Home Provider: [place]   I discussed the limitations of evaluation and management by telemedicine and the availability of in person appointments. The patient expressed understanding and agreed to proceed.  History of Present Illness:   [name] is a 45 year old male that presents via video  with complaints of COVID-19 infection.  Patient was originally diagnosed with Covid 19 on 02/01/2020.  Since that time, patient has had sore throat, chills, generalized malaise, fatigue, and headache.  Patient denies any fever.  He says that symptoms are slowly improving.  He does endorse loss of taste and smell.  Patient has been treating symptoms with over-the-counter analgesics and DayQuil/NyQuil with moderate relief.  He denies any dizziness, blurred vision, shortness of breath, chest pain, persistent cough, urinary symptoms, nausea, vomiting, or diarrhea.  Patient has been out of work for greater than 1 week.  He has questions on what is the appropriate time to return to work.  Of note, patient is fully vaccinated against COVID-19. Past Medical History:  Diagnosis Date  . Allergy   . Anxiety   . Chronic neck pain   . Chronic pain   . DDD (degenerative disc disease)   . Herniated cervical disc   . Hypertension   . IBS (irritable bowel syndrome)   . Migraines    since 2002  . Renal calculi   . Shingles   . Sleep apnea    does not wear c-pap   Social History   Socioeconomic History  . Marital status: Married    Spouse name: [name]  . Number of children: 3  . Years of education: Not on file  . Highest education level: Not on file  Occupational History  Employer: [employer]  Tobacco Use  . Smoking status: Never Smoker  . Smokeless tobacco: Never Used  Substance and Sexual Activity  . Alcohol use: No    Comment: rarely  . Drug use: No  . Sexual activity: Not on file  Other Topics Concern  . Not on file  Social History Narrative   Caffeine-Pt drinks 2 cups of caffeine daily.   Social Determinants of Health   Financial Resource Strain: Not on file  Food Insecurity: Not on file  Transportation Needs: Not on file  Physical Activity: Not on file  Stress: Not on file  Social Connections: Not on file  Intimate Partner Violence: Not on file   Allergies  Allergen  Reactions  . Erythromycin Nausea And Vomiting   Immunization History  Administered Date(s) Administered  . Influenza-Unspecified 11/21/2018  . Moderna Sars-Covid-2 Vaccination 04/12/2019    Observations/Objective:   Assessment and Plan: 1. COVID-19 Recommend that patient continue supportive care that includes increased rest, fluid intake, over-the-counter analgesics. Discussed CDC guidelines at length.  Patient has been symptomatic, recommend that he quarantine for total of 10 days.  Return to work on 02/11/2020.   2. Sore throat Tylenol 500 mg every 4 hours as needed for mild to moderate throat pain.  Also, warm salt water gargles as needed  3. Malaise and fatigue   Follow Up Instructions:    I discussed the assessment and treatment plan with the patient. The patient was provided an opportunity to ask questions and all were answered. The patient agreed with the plan and demonstrated an understanding of the instructions.   The patient was advised to call back or seek an in-person evaluation if the symptoms worsen or if the condition fails to improve as anticipated.  I provided 8 minutes of non-face-to-face time during this encounter.  Donia Pounds  APRN, MSN, FNP-C Patient [facility] [address] [place], [place] [address] [phone]   02/10/2020  12:05 PM

## 2020-02-12 MED FILL — PROPRANOLOL 40 MG TABLET: 40 | 90 days supply | Qty: 180 | Fill #0

## 2020-02-12 NOTE — Telephone Encounter (Signed)
Med sent in on 01/30/20

## 2020-02-12 NOTE — Telephone Encounter (Signed)
Patient has appointment 1/27 for med check

## 2020-02-13 ENCOUNTER — Telehealth: Payer: No Typology Code available for payment source | Admitting: Family Medicine

## 2020-02-13 ENCOUNTER — Other Ambulatory Visit: Payer: Self-pay

## 2020-02-18 ENCOUNTER — Other Ambulatory Visit: Payer: Self-pay

## 2020-02-18 ENCOUNTER — Telehealth (INDEPENDENT_AMBULATORY_CARE_PROVIDER_SITE_OTHER): Payer: No Typology Code available for payment source | Admitting: Family Medicine

## 2020-02-18 DIAGNOSIS — U071 COVID-19: Secondary | ICD-10-CM | POA: Diagnosis not present

## 2020-02-18 MED ORDER — PREDNISONE 10 MG PO TABS: ORAL_TABLET | ORAL | 0 refills | Status: DC

## 2020-02-18 NOTE — Progress Notes (Signed)
Patient ID: [name], male    DOB: Sep 21, 1975, 45 y.o.   MRN: [id number]    Virtual Visit via Telephone Note  I connected with [name] on 02/18/20 at  3:30 PM EST by telephone and verified that I am speaking with the correct person using two identifiers.  Location: Patient: home Provider: office   I discussed the limitations, risks, security and privacy concerns of performing an evaluation and management service by telephone and the availability of in person appointments. I also discussed with the patient that there may be a patient responsible charge related to this service. The patient expressed understanding and agreed to proceed.   Chief Complaint  Patient presents with  . follow up on chest pressure and cough s/p Covid   Subjective:    HPI   Patient states he was positive for Covid on 01/30/20 and returned to work 5 days ago and is still having problems with cough and tightness in chest.  Pt stating the covid is lingering on with the coughing.  Pt is back to work.  Low energy still.  Doing some dayquil and nyquil. Steam showers and increase in liquids. Used some of his inhaler he had a home. Uses an albuterol inhaler from last summer.  No dx of asthma or copd.  Saw Dr. Nicki Reaper last summer and had some chest tightness and was normal at that time.  Similar to this time.  Not all day long with coughing.  Just at night mainly.  Dry coughing. No fever, sore throat, or runny nose. Eating and drinking okay.   Medical History Victorio has a past medical history of Allergy, Anxiety, Chronic neck pain, Chronic pain, DDD (degenerative disc disease), Herniated cervical disc, Hypertension, IBS (irritable bowel syndrome), Migraines, Renal calculi, Shingles, and Sleep apnea.   Outpatient Encounter Medications as of 02/18/2020  Medication Sig  . predniSONE (DELTASONE) 10 MG tablet Take 3 tab p.o.x 2 days, then 2 tab p.o. x 2 days, then 1 tab x 2 days.  Marland Kitchen albuterol (VENTOLIN  HFA) 108 (90 Base) MCG/ACT inhaler Inhale 2 puffs into the lungs every 4 (four) hours as needed for wheezing or shortness of breath.  . cetirizine (ZYRTEC) 10 MG tablet Take 10 mg by mouth daily.  Marland Kitchen eletriptan (RELPAX) 40 MG tablet TAKE 1 TABLET BY MOUTH AS NEEDED FOR MIGRAINE. MAY REPEAT IN 2 HOURS IF NECESSARY AS NEEDED FOR MIGRAINES. MAX 2 TAB/24 HRS.  Marland Kitchen ondansetron (ZOFRAN) 4 MG tablet Take 1 tablet (4 mg total) by mouth every 8 (eight) hours as needed for nausea or vomiting.  . propranolol (INDERAL) 40 MG tablet Take 1 tablet (40 mg total) by mouth 2 (two) times daily.  . [DISCONTINUED] amoxicillin (AMOXIL) 500 MG capsule Take 1 capsule (500 mg total) by mouth 3 (three) times daily.  . [DISCONTINUED] benzonatate (TESSALON) 100 MG capsule Take 1 capsule (100 mg total) by mouth every 8 (eight) hours.  . [DISCONTINUED] levofloxacin (LEVAQUIN) 500 MG tablet Take 1 tablet (500 mg total) by mouth daily.   No facility-administered encounter medications on file as of 02/18/2020.     Review of Systems  Constitutional: Positive for fatigue. Negative for chills and fever.  HENT: Negative for congestion, ear pain, rhinorrhea, sinus pressure, sinus pain, sneezing and sore throat.   Eyes: Negative for pain, discharge and itching.  Respiratory: Positive for cough and chest tightness.   Gastrointestinal: Negative for diarrhea, nausea and vomiting.  Skin: Negative for rash.  Neurological: Negative for  headaches.     Vitals There were no vitals taken for this visit.  Objective:   Physical Exam  No PE due to phone visit.  Assessment and Plan   1. COVID-19 - predniSONE (DELTASONE) 10 MG tablet; Take 3 tab p.o.x 2 days, then 2 tab p.o. x 2 days, then 1 tab x 2 days.  Dispense: 12 tablet; Refill: 0   Pt having post viral cough lingering.  Cont to use albuterol inhaler at home every 4hrs.  Prednisone taper for 6 days,  And use delsym or mucinex for coughing. Increase fluids.   Call or rto in  2-3 days if not improving. Pt in agreement.   Follow Up Instructions:    I discussed the assessment and treatment plan with the patient. The patient was provided an opportunity to ask questions and all were answered. The patient agreed with the plan and demonstrated an understanding of the instructions.   The patient was advised to call back or seek an in-person evaluation if the symptoms worsen or if the condition fails to improve as anticipated.  I provided 15 minutes of non-face-to-face time during this encounter.

## 2020-02-24 ENCOUNTER — Telehealth: Payer: Self-pay | Admitting: Family Medicine

## 2020-02-24 ENCOUNTER — Telehealth (INDEPENDENT_AMBULATORY_CARE_PROVIDER_SITE_OTHER): Payer: No Typology Code available for payment source | Admitting: Family Medicine

## 2020-02-24 ENCOUNTER — Other Ambulatory Visit: Payer: Self-pay

## 2020-02-24 DIAGNOSIS — F419 Anxiety disorder, unspecified: Secondary | ICD-10-CM

## 2020-02-24 DIAGNOSIS — I10 Essential (primary) hypertension: Secondary | ICD-10-CM | POA: Diagnosis not present

## 2020-02-24 DIAGNOSIS — G43009 Migraine without aura, not intractable, without status migrainosus: Secondary | ICD-10-CM | POA: Diagnosis not present

## 2020-02-24 DIAGNOSIS — L989 Disorder of the skin and subcutaneous tissue, unspecified: Secondary | ICD-10-CM | POA: Diagnosis not present

## 2020-02-24 NOTE — Progress Notes (Signed)
Patient ID: [name], male    DOB: 31-Dec-1975, 45 y.o.   MRN: [id number]   Virtual Visit via Telephone Note  I connected with [name] on 02/24/20 at  9:20 AM EST by telephone and verified that I am speaking with the correct person using two identifiers.  Location: Patient: home Provider: office   I discussed the limitations, risks, security and privacy concerns of performing an evaluation and management service by telephone and the availability of in person appointments. I also discussed with the patient that there may be a patient responsible charge related to this service. The patient expressed understanding and agreed to proceed.    Chief Complaint  Patient presents with  . Hypertension   Subjective:    HPI Pt on phone for f/u htn. Pt states he has been doing well. Taking meds as prescribed. Pt will need a referral to dermatology. He has an appt coming up with derm but insurance requires referral. Pt is going just for a checkup.   Pt stating doing better after covid.  Feeling he is getting better.  Feeling it was in chest for a while and used the steroid and inhaler and helped.   Taking propanolol for migraines and for HTN.   Last bp in 8/21- 148/102. - pt was sick at that visit. In 5/21- 120/90.  Pt has h/o sleep apena and cpap.  Not as compliant and is back to using it regularly.  Seeing Dr. Denna Haggard derm.  Has some Seb keratoses. Has some on head and on scalp.    Medical History [name] has a past medical history of Allergy, Anxiety, Chronic neck pain, Chronic pain, DDD (degenerative disc disease), Herniated cervical disc, Hypertension, IBS (irritable bowel syndrome), Migraines, Renal calculi, Shingles, and Sleep apnea.   Outpatient Encounter Medications as of 02/24/2020  Medication Sig  . albuterol (VENTOLIN HFA) 108 (90 Base) MCG/ACT inhaler Inhale 2 puffs into the lungs every 4 (four) hours as needed for wheezing or shortness of breath.  .  cetirizine (ZYRTEC) 10 MG tablet Take 10 mg by mouth daily. (Patient not taking: Reported on 02/26/2020)  . eletriptan (RELPAX) 40 MG tablet TAKE 1 TABLET BY MOUTH AS NEEDED FOR MIGRAINE. MAY REPEAT IN 2 HOURS IF NECESSARY AS NEEDED FOR MIGRAINES. MAX 2 TAB/24 HRS.  Marland Kitchen propranolol (INDERAL) 40 MG tablet Take 1 tablet (40 mg total) by mouth 2 (two) times daily.  . [DISCONTINUED] ondansetron (ZOFRAN) 4 MG tablet Take 1 tablet (4 mg total) by mouth every 8 (eight) hours as needed for nausea or vomiting.  . [DISCONTINUED] predniSONE (DELTASONE) 10 MG tablet Take 3 tab p.o.x 2 days, then 2 tab p.o. x 2 days, then 1 tab x 2 days.   No facility-administered encounter medications on file as of 02/24/2020.     Review of Systems  Constitutional: Negative for chills and fever.  HENT: Negative for congestion, rhinorrhea and sore throat.   Respiratory: Negative for cough, shortness of breath and wheezing.   Cardiovascular: Negative for chest pain and leg swelling.  Gastrointestinal: Negative for abdominal pain, diarrhea, nausea and vomiting.  Genitourinary: Negative for dysuria and frequency.  Skin: Negative for rash.  Neurological: Negative for dizziness, weakness and headaches.    Vitals There were no vitals taken for this visit.  Objective:   Physical Exam No PE due to phone visit.  Assessment and Plan   1. Essential hypertension  2. Anxiety  3. Skin lesion of scalp - Ambulatory referral  to Dermatology  4. Skin lesion of face - Ambulatory referral to Dermatology  5. Migraine without aura and without status migrainosus, not intractable   htn- Pt going to check his bp at work and will try to get a few and call us.  Pt give refill on propranolol and relpax.   Migraines- stable. Cont with relpax and propranolol.  Pt has seen by derm, Dr. Denna Haggard, pt wanting referral to see him again about lesions on scalp and on temple.   F/u 33mo.    Follow Up Instructions:    I discussed the  assessment and treatment plan with the patient. The patient was provided an opportunity to ask questions and all were answered. The patient agreed with the plan and demonstrated an understanding of the instructions.   The patient was advised to call back or seek an in-person evaluation if the symptoms worsen or if the condition fails to improve as anticipated.  I provided 15 minutes of non-face-to-face time during this encounter.

## 2020-02-24 NOTE — Telephone Encounter (Signed)
[name], eifler are scheduled for a virtual visit with your provider today.    Just as we do with appointments in the office, we must obtain your consent to participate.  Your consent will be active for this visit and any virtual visit you may have with one of our providers in the next 365 days.    If you have a MyChart account, I can also send a copy of this consent to you electronically.  All virtual visits are billed to your insurance company just like a traditional visit in the office.  As this is a virtual visit, video technology does not allow for your provider to perform a traditional examination.  This may limit your provider's ability to fully assess your condition.  If your provider identifies any concerns that need to be evaluated in person or the need to arrange testing such as labs, EKG, etc, we will make arrangements to do so.    Although advances in technology are sophisticated, we cannot ensure that it will always work on either your end or our end.  If the connection with a video visit is poor, we may have to switch to a telephone visit.  With either a video or telephone visit, we are not always able to ensure that we have a secure connection.   I need to obtain your verbal consent now.   Are you willing to proceed with your visit today?   [name] has provided verbal consent on 02/24/2020 for a virtual visit (video or telephone).   Vicente Males, LPN 05/25/5275  8:24 AM

## 2020-02-25 ENCOUNTER — Ambulatory Visit: Payer: No Typology Code available for payment source | Admitting: Dermatology

## 2020-02-26 ENCOUNTER — Other Ambulatory Visit: Payer: Self-pay

## 2020-02-26 ENCOUNTER — Ambulatory Visit (INDEPENDENT_AMBULATORY_CARE_PROVIDER_SITE_OTHER): Payer: No Typology Code available for payment source | Admitting: Dermatology

## 2020-02-26 ENCOUNTER — Other Ambulatory Visit: Payer: Self-pay | Admitting: Dermatology

## 2020-02-26 ENCOUNTER — Encounter: Payer: Self-pay | Admitting: Dermatology

## 2020-02-26 DIAGNOSIS — Z1283 Encounter for screening for malignant neoplasm of skin: Secondary | ICD-10-CM | POA: Diagnosis not present

## 2020-02-26 DIAGNOSIS — L821 Other seborrheic keratosis: Secondary | ICD-10-CM

## 2020-02-26 DIAGNOSIS — R21 Rash and other nonspecific skin eruption: Secondary | ICD-10-CM

## 2020-02-26 LAB — POCT SKIN KOH

## 2020-02-26 MED ORDER — CLOBETASOL PROP EMOLLIENT BASE 0.05 % EX CREA: 1.0000 "application " | TOPICAL_CREAM | Freq: Two times a day (BID) | CUTANEOUS | 2 refills | Status: DC

## 2020-02-26 MED FILL — CLOBETASOL EMOLLIENT 0.05%: 0.05 | 10 days supply | Qty: 30 | Fill #0

## 2020-03-06 ENCOUNTER — Encounter: Payer: Self-pay | Admitting: Dermatology

## 2020-03-06 NOTE — Progress Notes (Signed)
   Follow-Up Visit   Subjective  [name] is a 45 y.o. male who presents for the following: Annual Exam (RIGHT FOOT RASH X MONTHS RIGHT THIGH SAME RASH, KERATOSES SCALP).  Rash right foot with new spot right thigh Location:  Duration:  Quality:  Associated Signs/Symptoms: Modifying Factors:  Severity:  Timing: Context: Also would like skin checked  Objective  Well appearing patient in no apparent distress; mood and affect are within normal limits. Objective  Right Breast: Full body skin examination: No atypical moles, melanoma, or nonmobile skin cancer.  Objective  Right Foot - Anterior: 3 cm nonmarginated patch of chronic dermatitis; KOH negative.  No sign of tinea toenails or toe webs.  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  Objective  Left Temporal Scalp, Left Upper Back: Brown flattopped keratotic 3 to 6 mm papules   A full examination was performed including scalp, head, eyes, ears, nose, lips, neck, chest, axillae, abdomen, back, buttocks, bilateral upper extremities, bilateral lower extremities, hands, feet, fingers, toes, fingernails, and toenails. All findings within normal limits unless otherwise noted below.   Assessment & Plan    Encounter for screening for malignant neoplasm of skin Right Breast  Annual skin examination, encouraged to self examine his skin twice annually.  Continue ultraviolet protection.  Rash and other nonspecific skin eruption Right Foot - Anterior  Clobetasol apply daily after bathing for 1 month; follow-up by MyChart or telephone at that time  POCT Skin KOH - Right Foot - Anterior  Ordered Medications: Clobetasol Prop Emollient Base (CLOBETASOL PROPIONATE E) 0.05 % emollient cream  Seborrheic keratosis (2) Left Upper Back; Left Temporal Scalp  Okay to leave if stable; given the option of either freezing or shave removal.  Patient will defer for now     I, Lavonna Monarch, MD, have  reviewed all documentation for this visit.  The documentation on 03/06/20 for the exam, diagnosis, procedures, and orders are all accurate and complete.

## 2020-03-25 ENCOUNTER — Telehealth: Payer: Self-pay | Admitting: Family Medicine

## 2020-03-25 ENCOUNTER — Telehealth: Payer: Self-pay

## 2020-03-25 MED ORDER — ENALAPRIL MALEATE 10 MG PO TABS: ORAL_TABLET | ORAL | 3 refills | Status: DC

## 2020-03-25 NOTE — Telephone Encounter (Signed)
Return nurses call   Pt call back [phone]

## 2020-03-25 NOTE — Telephone Encounter (Signed)
Pt returned call but nurses unavailable. Contact patient again and pt verbalized understanding. Medication sent to [facility] [place]

## 2020-03-25 NOTE — Telephone Encounter (Signed)
Please advise. Thank you

## 2020-03-25 NOTE — Telephone Encounter (Signed)
Patient was told at last visit to record his BP readings :3/2-146/93, 3/4-139/89,and 3/6-132/86 please advise

## 2020-03-25 NOTE — Telephone Encounter (Signed)
Left message to return call 

## 2020-03-26 ENCOUNTER — Other Ambulatory Visit: Payer: Self-pay | Admitting: Family Medicine

## 2020-03-26 ENCOUNTER — Encounter: Payer: Self-pay | Admitting: Family Medicine

## 2020-03-26 NOTE — Telephone Encounter (Signed)
Spoke with patient yesterday afternoon

## 2020-03-31 ENCOUNTER — Other Ambulatory Visit: Payer: Self-pay | Admitting: Family Medicine

## 2020-03-31 ENCOUNTER — Telehealth: Payer: Self-pay

## 2020-03-31 MED ORDER — ELETRIPTAN HYDROBROMIDE 40 MG PO TABS: ORAL_TABLET | ORAL | 0 refills | Status: DC

## 2020-03-31 NOTE — Telephone Encounter (Signed)
[facility] sent a request for refill on eletriptan (RELPAX) 40 MG tablet for Pt migraine and pt is calling following up was sent last week [place], [place]   Pt call back [phone]

## 2020-03-31 NOTE — Telephone Encounter (Signed)
Pt.notified

## 2020-04-03 ENCOUNTER — Ambulatory Visit (INDEPENDENT_AMBULATORY_CARE_PROVIDER_SITE_OTHER): Payer: No Typology Code available for payment source | Admitting: Family Medicine

## 2020-04-03 ENCOUNTER — Other Ambulatory Visit: Payer: Self-pay

## 2020-04-03 VITALS — BP 130/82 | HR 96 | Temp 97.4°F | Ht 70.0 in | Wt 198.6 lb

## 2020-04-03 DIAGNOSIS — Z0000 Encounter for general adult medical examination without abnormal findings: Secondary | ICD-10-CM | POA: Diagnosis not present

## 2020-04-03 DIAGNOSIS — G43009 Migraine without aura, not intractable, without status migrainosus: Secondary | ICD-10-CM | POA: Diagnosis not present

## 2020-04-03 DIAGNOSIS — I10 Essential (primary) hypertension: Secondary | ICD-10-CM

## 2020-04-03 NOTE — Progress Notes (Signed)
Patient ID: [name], male    DOB: 1975/03/19, 45 y.o.   MRN: [id number]   Chief Complaint  Patient presents with  . Annual Exam   Subjective:    HPI   The patient comes in today for a wellness visit.  A review of their health history was completed.  A review of medications was also completed.  Any needed refills; yes propranolol  Eating habits: getting better  Falls/  MVA accidents in past few months: none  Regular exercise: walk a few times a week  Specialist pt sees on regular basis: no  Preventative health issues were discussed.   Additional concerns: questions about vitamins  htn- Pt was having side effects from lisinopril when he was on it in past.  Was off it now.  Pt is on propranolol and taking for migraines but does help with bp.   Migraines- had 2 last week, but was able to take relpax and improved.  Had more energy when on mvt. And wondered if needing magniesum or L-arginine.  Was losing weight, then father in law passed then wasn't sticking to the diet. Then had a move and now all living with family together.  Medical History [name] has a past medical history of Allergy, Anxiety, Chronic neck pain, Chronic pain, DDD (degenerative disc disease), Herniated cervical disc, Hypertension, IBS (irritable bowel syndrome), Migraines, Renal calculi, Shingles, and Sleep apnea.   Outpatient Encounter Medications as of 04/03/2020  Medication Sig  . albuterol (VENTOLIN HFA) 108 (90 Base) MCG/ACT inhaler Inhale 2 puffs into the lungs every 4 (four) hours as needed for wheezing or shortness of breath.  . cetirizine (ZYRTEC) 10 MG tablet Take 10 mg by mouth daily. (Patient not taking: Reported on 02/26/2020)  . Clobetasol Prop Emollient Base (CLOBETASOL PROPIONATE E) 0.05 % emollient cream Apply 1 application topically 2 (two) times daily.  . [DISCONTINUED] eletriptan (RELPAX) 40 MG tablet May repeat in 2 hours if headache persists or recurs.  . [DISCONTINUED]  propranolol (INDERAL) 40 MG tablet Take 1 tablet (40 mg total) by mouth 2 (two) times daily.   No facility-administered encounter medications on file as of 04/03/2020.     Review of Systems  Constitutional: Negative for chills and fever.  HENT: Negative for congestion, rhinorrhea and sore throat.   Respiratory: Negative for cough, shortness of breath and wheezing.   Cardiovascular: Negative for chest pain and leg swelling.  Gastrointestinal: Negative for abdominal pain, diarrhea, nausea and vomiting.  Genitourinary: Negative for dysuria and frequency.  Skin: Negative for rash.  Neurological: Negative for dizziness, weakness and headaches.     Vitals BP 130/82   Pulse 96   Temp (!) 97.4 F (36.3 C) (Oral)   Ht $R'5\' 10"'bI$  (1.778 m)   Wt 198 lb 9.6 oz (90.1 kg)   SpO2 100%   BMI 28.50 kg/m   Objective:   Physical Exam Vitals and nursing note reviewed.  Constitutional:      General: He is not in acute distress.    Appearance: Normal appearance. He is not ill-appearing.  HENT:     Head: Normocephalic.     Right Ear: Tympanic membrane, ear canal and external ear normal.     Left Ear: Tympanic membrane, ear canal and external ear normal.     Nose: Nose normal. No congestion or rhinorrhea.     Mouth/Throat:     Mouth: Mucous membranes are moist.     Pharynx: No oropharyngeal exudate or posterior oropharyngeal erythema.  Eyes:     Extraocular Movements: Extraocular movements intact.     Conjunctiva/sclera: Conjunctivae normal.     Pupils: Pupils are equal, round, and reactive to light.  Cardiovascular:     Rate and Rhythm: Normal rate and regular rhythm.     Pulses: Normal pulses.     Heart sounds: Normal heart sounds. No murmur heard.   Pulmonary:     Effort: Pulmonary effort is normal. No respiratory distress.     Breath sounds: Normal breath sounds. No wheezing, rhonchi or rales.  Abdominal:     General: Abdomen is flat. Bowel sounds are normal. There is no distension.      Palpations: Abdomen is soft. There is no mass.     Tenderness: There is no abdominal tenderness. There is no guarding or rebound.     Hernia: No hernia is present.  Musculoskeletal:        General: Normal range of motion.     Cervical back: Normal range of motion.     Right lower leg: No edema.     Left lower leg: No edema.  Skin:    General: Skin is warm and dry.     Findings: No rash.  Neurological:     General: No focal deficit present.     Mental Status: He is alert and oriented to person, place, and time.     Cranial Nerves: No cranial nerve deficit.     Motor: No weakness.     Gait: Gait normal.  Psychiatric:        Mood and Affect: Mood normal.        Behavior: Behavior normal.        Thought Content: Thought content normal.        Judgment: Judgment normal.      Assessment and Plan   1. Well adult exam  2. Essential hypertension  3. Laboratory tests ordered as part of a complete physical exam (CPE) - CBC - CMP14+EGFR - Lipid panel - PSA  4. Migraine without aura and without status migrainosus, not intractable   htn- stable. Cont meds.  Migraine- stable. Controlled with meds.  Hypertriglyceridemia- stable. Cont with fenofibrate.  Pt going to call insurance and may want colon cancer screening.  Grandmother with breast cancer, and father with prostate cancer.   Return in about 6 months (around 10/04/2020) for f/u htn/migraine.

## 2020-04-04 LAB — CMP14+EGFR
ALT: 19 IU/L (ref 0–44)
AST: 24 IU/L (ref 0–40)
Albumin/Globulin Ratio: 1.6 (ref 1.2–2.2)
Albumin: 4.9 g/dL (ref 4.0–5.0)
Alkaline Phosphatase: 95 IU/L (ref 44–121)
BUN/Creatinine Ratio: 18 (ref 9–20)
BUN: 16 mg/dL (ref 6–24)
Bilirubin Total: 0.5 mg/dL (ref 0.0–1.2)
CO2: 22 mmol/L (ref 20–29)
Calcium: 9.8 mg/dL (ref 8.7–10.2)
Chloride: 97 mmol/L (ref 96–106)
Creatinine, Ser: 0.9 mg/dL (ref 0.76–1.27)
Globulin, Total: 3.1 g/dL (ref 1.5–4.5)
Glucose: 91 mg/dL (ref 65–99)
Potassium: 4.2 mmol/L (ref 3.5–5.2)
Sodium: 138 mmol/L (ref 134–144)
Total Protein: 8 g/dL (ref 6.0–8.5)
eGFR: 107 mL/min/{1.73_m2} (ref >59)

## 2020-04-04 LAB — LIPID PANEL
Chol/HDL Ratio: 6.4 ratio — ABNORMAL HIGH (ref 0.0–5.0)
Cholesterol, Total: 216 mg/dL — ABNORMAL HIGH (ref 100–199)
HDL: 34 mg/dL — ABNORMAL LOW (ref >39)
LDL Chol Calc (NIH): 107 mg/dL — ABNORMAL HIGH (ref 0–99)
Triglycerides: 435 mg/dL — ABNORMAL HIGH (ref 0–149)
VLDL Cholesterol Cal: 75 mg/dL — ABNORMAL HIGH (ref 5–40)

## 2020-04-04 LAB — CBC
Hematocrit: 44.4 % (ref 37.5–51.0)
Hemoglobin: 15 g/dL (ref 13.0–17.7)
MCH: 29.2 pg (ref 26.6–33.0)
MCHC: 33.8 g/dL (ref 31.5–35.7)
MCV: 87 fL (ref 79–97)
Platelets: 319 10*3/uL (ref 150–450)
RBC: 5.13 x10E6/uL (ref 4.14–5.80)
RDW: 13.1 % (ref 11.6–15.4)
WBC: 7.5 10*3/uL (ref 3.4–10.8)

## 2020-04-04 LAB — PSA: Prostate Specific Ag, Serum: 0.8 ng/mL (ref 0.0–4.0)

## 2020-04-08 ENCOUNTER — Other Ambulatory Visit: Payer: Self-pay | Admitting: Family Medicine

## 2020-04-08 MED ORDER — FENOFIBRATE 160 MG PO TABS: ORAL_TABLET | ORAL | 1 refills | Status: DC

## 2020-05-11 ENCOUNTER — Other Ambulatory Visit (HOSPITAL_COMMUNITY): Payer: Self-pay

## 2020-05-11 MED ORDER — FENOFIBRATE 160 MG PO TABS
160.0000 mg | ORAL_TABLET | Freq: Every day | ORAL | 0 refills | Status: DC
  Filled 2020-05-11: qty 90, 90d supply, fill #0
  Filled 2020-08-07: qty 60, 60d supply, fill #1

## 2020-05-12 ENCOUNTER — Telehealth: Payer: Self-pay | Admitting: Family Medicine

## 2020-05-12 NOTE — Telephone Encounter (Signed)
Patient states insurance will no longer cover his migraine medication eletriptan 40 mg and wanting something close to it that his insurance will cover . Also has a rash on arms ears hands it burns He is will to do video visit for rash. Please advise

## 2020-05-13 ENCOUNTER — Other Ambulatory Visit (HOSPITAL_COMMUNITY): Payer: Self-pay

## 2020-05-13 MED ORDER — NARATRIPTAN HCL 2.5 MG PO TABS
2.5000 mg | ORAL_TABLET | ORAL | 1 refills | Status: DC | PRN
  Filled 2020-05-13 – 2020-06-09 (×2): qty 10, 30d supply, fill #0

## 2020-05-13 NOTE — Telephone Encounter (Signed)
See other note. Thx. Dr. Lovena Le

## 2020-05-13 NOTE — Telephone Encounter (Signed)
Pt sent My chart message also. Please see my chart message

## 2020-05-13 NOTE — Telephone Encounter (Signed)
Pls call pt to see how he's doing with the rash.  Hydrocortisone cream otc, and benadryl and if rash isn't improving then needs to go to urgent care if no other openings today or tomorrow. Sent in naratriptan for his migraines, see if they cover this med.  Thx.   Dr. Lovena Le

## 2020-05-21 ENCOUNTER — Other Ambulatory Visit (HOSPITAL_COMMUNITY): Payer: Self-pay

## 2020-06-04 MED FILL — Propranolol HCl Tab 40 MG: ORAL | 90 days supply | Qty: 180 | Fill #0 | Status: AC

## 2020-06-05 ENCOUNTER — Other Ambulatory Visit (HOSPITAL_COMMUNITY): Payer: Self-pay

## 2020-06-09 ENCOUNTER — Other Ambulatory Visit (HOSPITAL_COMMUNITY): Payer: Self-pay

## 2020-08-07 ENCOUNTER — Other Ambulatory Visit (HOSPITAL_COMMUNITY): Payer: Self-pay

## 2020-09-10 ENCOUNTER — Other Ambulatory Visit (HOSPITAL_COMMUNITY): Payer: Self-pay

## 2020-09-10 ENCOUNTER — Telehealth: Payer: Self-pay | Admitting: Family Medicine

## 2020-09-10 MED ORDER — PROPRANOLOL HCL 40 MG PO TABS: 40.0000 mg | ORAL_TABLET | Freq: Two times a day (BID) | ORAL | 0 refills | Status: DC

## 2020-09-10 NOTE — Telephone Encounter (Signed)
Patient went out of town for a few days and forgot to take his migraine medication with him and now requesting a few pills until he gets home on 8/28 called in [facility] in [place] for propranolol 40 mg. Please advise

## 2020-09-10 NOTE — Telephone Encounter (Signed)
Prescription sent electronically to pharmacy  Left message to return call 

## 2020-09-10 NOTE — Telephone Encounter (Signed)
Propranolol 40 mg 1 twice daily as directed, #10

## 2020-09-11 NOTE — Telephone Encounter (Signed)
Left message regarding prescription sent to pharmacy

## 2020-09-29 ENCOUNTER — Other Ambulatory Visit: Payer: Self-pay | Admitting: Family Medicine

## 2020-09-30 ENCOUNTER — Other Ambulatory Visit (HOSPITAL_COMMUNITY): Payer: Self-pay

## 2020-09-30 MED ORDER — PROPRANOLOL HCL 40 MG PO TABS
40.0000 mg | ORAL_TABLET | Freq: Two times a day (BID) | ORAL | 0 refills | Status: DC
  Filled 2020-09-30: qty 180, 90d supply, fill #0

## 2020-10-02 ENCOUNTER — Other Ambulatory Visit (HOSPITAL_COMMUNITY): Payer: Self-pay

## 2020-10-07 ENCOUNTER — Other Ambulatory Visit (HOSPITAL_COMMUNITY): Payer: Self-pay

## 2020-10-07 ENCOUNTER — Telehealth: Payer: Self-pay | Admitting: Family Medicine

## 2020-10-07 ENCOUNTER — Other Ambulatory Visit: Payer: Self-pay

## 2020-10-07 MED ORDER — FENOFIBRATE 160 MG PO TABS
160.0000 mg | ORAL_TABLET | Freq: Every day | ORAL | 0 refills | Status: DC
  Filled 2020-10-07 – 2020-10-19 (×2): qty 30, 30d supply, fill #0

## 2020-10-07 NOTE — Telephone Encounter (Signed)
Short script sent to pharmacy per request

## 2020-10-07 NOTE — Telephone Encounter (Signed)
Patient is seeking care at another practice. Has an appointment with his new physician end of October. Needs a few fenofibrate to get him through until appointment. He currently has approx. 15 left   Pharmacy: [facility]  CB#  [phone]

## 2020-10-09 ENCOUNTER — Ambulatory Visit
Admission: EM | Admit: 2020-10-09 | Discharge: 2020-10-09 | Disposition: A | Discharge status: Home / Self Care | Payer: No Typology Code available for payment source | Attending: Emergency Medicine | Admitting: Emergency Medicine

## 2020-10-09 ENCOUNTER — Other Ambulatory Visit: Payer: Self-pay

## 2020-10-09 ENCOUNTER — Encounter: Payer: Self-pay | Admitting: Emergency Medicine

## 2020-10-09 DIAGNOSIS — M5441 Lumbago with sciatica, right side: Secondary | ICD-10-CM | POA: Diagnosis not present

## 2020-10-09 MED ORDER — PREDNISONE 10 MG (21) PO TBPK: ORAL_TABLET | Freq: Every day | ORAL | 0 refills | Status: DC

## 2020-10-09 MED ORDER — CYCLOBENZAPRINE HCL 10 MG PO TABS: 10.0000 mg | ORAL_TABLET | Freq: Two times a day (BID) | ORAL | 0 refills | Status: DC | PRN

## 2020-10-09 NOTE — ED Triage Notes (Signed)
Right lower back pain x 2 days that runs down right leg, nausea

## 2020-10-09 NOTE — ED Provider Notes (Signed)
[facility]   [id number] 10/09/20 Arrival Time: 19  CC: back PAIN  SUBJECTIVE: History from: patient and family. [name] is a 45 y.o. male complains of RT low back pain x few days, acute on chronic problem.  Denies a precipitating event or specific injury.  Localizes the pain to the RT low back.  Describes the pain as intermittent and sharp in character.  Has tried OTC medications without relief.  Symptoms are made worse with movement.  Denies similar symptoms in the past.  Denies fever, chills, erythema, ecchymosis, effusion, weakness, numbness and tingling, saddle paresthesias, loss of bowel or bladder function.      ROS: As per HPI.  All other pertinent ROS negative.     Past Medical History:  Diagnosis Date   Allergy    Anxiety    Chronic neck pain    Chronic pain    DDD (degenerative disc disease)    Herniated cervical disc    Hypertension    IBS (irritable bowel syndrome)    Migraines    since 2002   Renal calculi    Shingles    Sleep apnea    does not wear c-pap   Past Surgical History:  Procedure Laterality Date   ANTERIOR CERVICAL DECOMP/DISCECTOMY FUSION N/A 08/15/2013   Procedure: Anterior Cervical Four-Five/Five-Six/Six-Seven Decompression and Fusion;  Surgeon: Eustace Moore, MD;  Location: [facility];  Service: Neurosurgery;  Laterality: N/A;   LITHOTRIPSY     WISDOM TOOTH EXTRACTION     Allergies  Allergen Reactions   Erythromycin Nausea And Vomiting   Lisinopril     Headaches, concern about kidney stones. No allergy to the medication.   No current facility-administered medications on file prior to encounter.   Current Outpatient Medications on File Prior to Encounter  Medication Sig Dispense Refill   albuterol (VENTOLIN HFA) 108 (90 Base) MCG/ACT inhaler Inhale 2 puffs into the lungs every 4 (four) hours as needed for wheezing or shortness of breath. 18 g 0   cetirizine (ZYRTEC) 10 MG tablet Take 10 mg by mouth daily. (Patient not  taking: Reported on 02/26/2020)     Clobetasol Prop Emollient Base 0.05 % emollient cream APPLY 1 APPLICATION TOPICALLY 2 (TWO) TIMES DAILY. 30 g 2   fenofibrate 160 MG tablet Take 1 tablet (160 mg total) by mouth at bedtime. 30 tablet 0   naratriptan (AMERGE) 2.5 MG tablet Take one (1) tablet at onset of migraine; if returns or does not resolve, may repeat after 4 hours; do not exceed five (5) mg in 24 hours. 10 tablet 1   propranolol (INDERAL) 40 MG tablet Take 1 tablet (40 mg total) by mouth 2 (two) times daily. 10 tablet 0   propranolol (INDERAL) 40 MG tablet Take 1 tablet (40 mg total) by mouth 2 (two) times daily. 180 tablet 0   Social History   Socioeconomic History   Marital status: Married    Spouse name: Anderson Malta   Number of children: 3   Years of education: Not on file   Highest education level: Not on file  Occupational History    Employer: [employer]  Tobacco Use   Smoking status: Never   Smokeless tobacco: Never  Vaping Use   Vaping Use: Never used  Substance and Sexual Activity   Alcohol use: No    Comment: rarely   Drug use: No   Sexual activity: Not on file  Other Topics Concern   Not on file  Social  History Narrative   Caffeine-Pt drinks 2 cups of caffeine daily.   Social Determinants of Health   Financial Resource Strain: Not on file  Food Insecurity: Not on file  Transportation Needs: Not on file  Physical Activity: Not on file  Stress: Not on file  Social Connections: Not on file  Intimate Partner Violence: Not on file   Family History  Problem Relation Age of Onset   Headache Mother    Prostate cancer Father    Kidney cancer Father    Kidney Stones Sister    Migraines Sister     OBJECTIVE:  Vitals:   10/09/20 1721  BP: 129/85  Pulse: 81  Resp: 16  Temp: 98 F (36.7 C)  TempSrc: Oral  SpO2: 98%    General appearance: ALERT; in no acute distress.  Head: NCAT Lungs: Normal respiratory effort Musculoskeletal: Back Inspection: Skin  warm, dry, clear and intact without obvious erythema, effusion, or ecchymosis.  Palpation: TTP over RT low back ROM: FROM active and passive Strength: 5/5 hip flexion, 5/5 hip extension Abdomen: soft, nondistended, somewhat tight, normal active bowel sounds; nontender to palpation; no guarding  Skin: warm and dry Neurologic: Ambulates without difficulty; Sensation intact about the upper/ lower extremities Psychological: alert and cooperative; normal mood and affect   ASSESSMENT & PLAN:  1. Acute right-sided low back pain with right-sided sciatica      Meds ordered this encounter  Medications   predniSONE (STERAPRED UNI-PAK 21 TAB) 10 MG (21) TBPK tablet    Sig: Take by mouth daily. Take 6 tabs by mouth daily  for 2 days, then 5 tabs for 2 days, then 4 tabs for 2 days, then 3 tabs for 2 days, 2 tabs for 2 days, then 1 tab by mouth daily for 2 days    Dispense:  42 tablet    Refill:  0    Order Specific Question:   Supervising Provider    Answer:   Raylene Everts [[id number]]   cyclobenzaprine (FLEXERIL) 10 MG tablet    Sig: Take 1 tablet (10 mg total) by mouth 2 (two) times daily as needed for muscle spasms.    Dispense:  20 tablet    Refill:  0    Order Specific Question:   Supervising Provider    Answer:   Raylene Everts [[id number]]    Continue conservative management of rest, ice, and gentle stretches Prednisone prescribed Take cyclobenzaprine at nighttime for symptomatic relief. Avoid driving or operating heavy machinery while using medication. Follow up with PCP if symptoms persist Return or go to the ER if you have any new or worsening symptoms (fever, chills, chest pain, abdominal pain, changes in bowel or bladder habits, pain radiating into lower legs, etc...)   Also mentions abdomen feels tight/ distended, NTTP.  Will go to the ED if symptoms progress.     Reviewed expectations re: course of current medical issues. Questions answered. Outlined signs and symptoms  indicating need for more acute intervention. Patient verbalized understanding. After Visit Summary given.     Lestine Box, PA-C 10/09/20 1732

## 2020-10-09 NOTE — Discharge Instructions (Signed)
Continue conservative management of rest, ice, and gentle stretches Prednisone prescribed.   Take cyclobenzaprine at nighttime for symptomatic relief. Avoid driving or operating heavy machinery while using medication. Follow up with PCP if symptoms persist Return or go to the ER if you have any new or worsening symptoms (fever, chills, chest pain, abdominal pain, changes in bowel or bladder habits, pain radiating into lower legs, etc...)  

## 2020-10-15 ENCOUNTER — Other Ambulatory Visit (HOSPITAL_COMMUNITY): Payer: Self-pay

## 2020-10-16 ENCOUNTER — Other Ambulatory Visit (HOSPITAL_COMMUNITY): Payer: Self-pay

## 2020-10-19 ENCOUNTER — Other Ambulatory Visit (HOSPITAL_COMMUNITY): Payer: Self-pay

## 2020-10-22 ENCOUNTER — Other Ambulatory Visit (HOSPITAL_COMMUNITY): Payer: Self-pay

## 2020-10-30 ENCOUNTER — Ambulatory Visit (INDEPENDENT_AMBULATORY_CARE_PROVIDER_SITE_OTHER): Payer: No Typology Code available for payment source | Admitting: Nurse Practitioner

## 2020-10-30 ENCOUNTER — Other Ambulatory Visit (HOSPITAL_COMMUNITY): Payer: Self-pay

## 2020-10-30 ENCOUNTER — Other Ambulatory Visit: Payer: Self-pay

## 2020-10-30 ENCOUNTER — Encounter (HOSPITAL_BASED_OUTPATIENT_CLINIC_OR_DEPARTMENT_OTHER): Payer: Self-pay | Admitting: Nurse Practitioner

## 2020-10-30 VITALS — BP 120/74 | HR 80 | Ht 70.0 in | Wt 198.6 lb

## 2020-10-30 DIAGNOSIS — Z23 Encounter for immunization: Secondary | ICD-10-CM

## 2020-10-30 DIAGNOSIS — G43009 Migraine without aura, not intractable, without status migrainosus: Secondary | ICD-10-CM

## 2020-10-30 DIAGNOSIS — L821 Other seborrheic keratosis: Secondary | ICD-10-CM | POA: Diagnosis not present

## 2020-10-30 DIAGNOSIS — G4733 Obstructive sleep apnea (adult) (pediatric): Secondary | ICD-10-CM

## 2020-10-30 DIAGNOSIS — I10 Essential (primary) hypertension: Secondary | ICD-10-CM

## 2020-10-30 DIAGNOSIS — E7800 Pure hypercholesterolemia, unspecified: Secondary | ICD-10-CM

## 2020-10-30 DIAGNOSIS — E781 Pure hyperglyceridemia: Secondary | ICD-10-CM | POA: Insufficient documentation

## 2020-10-30 DIAGNOSIS — Z1211 Encounter for screening for malignant neoplasm of colon: Secondary | ICD-10-CM

## 2020-10-30 DIAGNOSIS — Z0000 Encounter for general adult medical examination without abnormal findings: Secondary | ICD-10-CM | POA: Insufficient documentation

## 2020-10-30 DIAGNOSIS — R110 Nausea: Secondary | ICD-10-CM

## 2020-10-30 HISTORY — DX: Nausea: R11.0

## 2020-10-30 MED ORDER — ONDANSETRON 8 MG PO TBDP
8.0000 mg | ORAL_TABLET | Freq: Three times a day (TID) | ORAL | 5 refills | Status: DC | PRN
  Filled 2020-10-30: qty 30, 10d supply, fill #0

## 2020-10-30 MED ORDER — PROPRANOLOL HCL 40 MG PO TABS
40.0000 mg | ORAL_TABLET | Freq: Two times a day (BID) | ORAL | 3 refills | Status: DC
  Filled 2020-10-30 – 2021-01-01 (×3): qty 180, 90d supply, fill #0
  Filled 2021-03-08: qty 180, 90d supply, fill #1

## 2020-10-30 MED ORDER — FENOFIBRATE 160 MG PO TABS
160.0000 mg | ORAL_TABLET | Freq: Every day | ORAL | 3 refills | Status: DC
  Filled 2020-10-30 – 2020-12-16 (×2): qty 90, 90d supply, fill #0

## 2020-10-30 MED ORDER — NARATRIPTAN HCL 2.5 MG PO TABS
2.5000 mg | ORAL_TABLET | ORAL | 6 refills | Status: DC | PRN
  Filled 2020-10-30 – 2020-12-31 (×2): qty 10, 30d supply, fill #0
  Filled 2021-03-08: qty 10, 30d supply, fill #1
  Filled 2021-07-16: qty 10, 30d supply, fill #2
  Filled 2021-10-12: qty 10, 30d supply, fill #3

## 2020-10-30 NOTE — Assessment & Plan Note (Addendum)
Endorses consistent CPAP use and improved symptoms No concerns with daytime sleepiness, or apnea since starting BP well controlled.  Will monitor

## 2020-10-30 NOTE — Assessment & Plan Note (Signed)
Review of current and past medical history, social history, medication, and family history.  Review of care gaps and health maintenance recommendations.  Records from recent providers to be requested if not available in Chart Review or Care Everywhere.  Recommendations for health maintenance, diet, and exercise provided.  Labs today: None HM Recommendations: colonoscopy, flu CPE due: March 2023

## 2020-10-30 NOTE — Progress Notes (Signed)
Tollie Eth, DNP, AGNP-c Primary Care & Sports Medicine [address]  [address] [place], [place] [address] [phone]  New patient visit   Patient: [name]   DOB: 1975/10/13   45 y.o. Male  MRN: [id number] Visit Date: 10/30/2020  Patient Care Team: Cornelia Walraven, Sung Amabile, NP as PCP - General (Nurse Practitioner) Janalyn Harder, MD as Consulting Physician (Dermatology)  Today's healthcare provider: Tollie Eth, NP   Chief Complaint  Patient presents with   Establish Care    Patient has concerns about skin spots on head.   Subjective    [name] is a 45 y.o. male who presents today as a new patient to establish care.  HPI HPI     Establish Care    Additional comments: Patient has concerns about skin spots on head.      Last edited by Heloise Ochoa, CMA on 10/30/2020  9:27 AM.      Seborrheic Keratoses:  Located on the hairline of left temple and on scalp. Growing and getting darker.  Had evaluated by dermatology and diagnosis confirmed, but wanted second opinion.   Pilonidal Cyst:  X2 on scalp Not draining or painful at this time  Migraines:  well controlled on propranolol Has medication for acute symptoms, but rarely using  Kidney Stone:  Historical- calcium stones No current symptoms or issues  HTN:  Well controlled with propranolol  No CP, palpitations, ShOB, Vision changes  OSA Uses CPAP when sleeping Feels this is well controlled Managed by PCP   Past Medical History:  Diagnosis Date   Allergy    Anxiety    Chronic neck pain    Chronic pain    DDD (degenerative disc disease)    Herniated cervical disc    Hypertension    IBS (irritable bowel syndrome)    Migraines    since 2002   Migraines    Renal calculi    Shingles    Sleep apnea    does not wear c-pap   Past Surgical History:  Procedure Laterality Date   ANTERIOR CERVICAL DECOMP/DISCECTOMY FUSION N/A 08/15/2013   Procedure: Anterior Cervical  Four-Five/Five-Six/Six-Seven Decompression and Fusion;  Surgeon: Tia Alert, MD;  Location: [facility];  Service: Neurosurgery;  Laterality: N/A;   LITHOTRIPSY     WISDOM TOOTH EXTRACTION     Family Status  Relation Name Status   Mother  Alive   Father  Deceased   Sister  Alive   Sister  Alive   MGF  Deceased       history of melanoma   Family History  Problem Relation Age of Onset   Headache Mother    Cancer Father        prostate and kidney cancer   Prostate cancer Father    Kidney cancer Father    Kidney Stones Sister    Migraines Sister    Social History   Socioeconomic History   Marital status: Married    Spouse name: Victorino Dike   Number of children: 3   Years of education: Not on file   Highest education level: Not on file  Occupational History    Employer: [employer]  Tobacco Use   Smoking status: Never   Smokeless tobacco: Never  Vaping Use   Vaping Use: Never used  Substance and Sexual Activity   Alcohol use: No    Comment: rarely   Drug use: No   Sexual activity: Not on file  Other Topics Concern   Not on file  Social History Narrative   Caffeine-Pt drinks 2 cups of caffeine daily.   Social Determinants of Health   Financial Resource Strain: Not on file  Food Insecurity: Not on file  Transportation Needs: Not on file  Physical Activity: Not on file  Stress: Not on file  Social Connections: Not on file   Outpatient Medications Prior to Visit  Medication Sig   cyclobenzaprine (FLEXERIL) 10 MG tablet Take 1 tablet (10 mg total) by mouth 2 (two) times daily as needed for muscle spasms.   [DISCONTINUED] albuterol (VENTOLIN HFA) 108 (90 Base) MCG/ACT inhaler Inhale 2 puffs into the lungs every 4 (four) hours as needed for wheezing or shortness of breath.   [DISCONTINUED] cetirizine (ZYRTEC) 10 MG tablet Take 10 mg by mouth daily. (Patient not taking: Reported on 02/26/2020)   [DISCONTINUED] Clobetasol Prop Emollient Base 0.05 % emollient cream APPLY  1 APPLICATION TOPICALLY 2 (TWO) TIMES DAILY. (Patient not taking: Reported on 10/30/2020)   [DISCONTINUED] fenofibrate 160 MG tablet Take 1 tablet (160 mg total) by mouth at bedtime.   [DISCONTINUED] naratriptan (AMERGE) 2.5 MG tablet Take one (1) tablet at onset of migraine; if returns or does not resolve, may repeat after 4 hours; do not exceed five (5) mg in 24 hours.   [DISCONTINUED] predniSONE (STERAPRED UNI-PAK 21 TAB) 10 MG (21) TBPK tablet Take by mouth daily. Take 6 tabs by mouth daily  for 2 days, then 5 tabs for 2 days, then 4 tabs for 2 days, then 3 tabs for 2 days, 2 tabs for 2 days, then 1 tab by mouth daily for 2 days   [DISCONTINUED] propranolol (INDERAL) 40 MG tablet Take 1 tablet (40 mg total) by mouth 2 (two) times daily.   [DISCONTINUED] propranolol (INDERAL) 40 MG tablet Take 1 tablet (40 mg total) by mouth 2 (two) times daily.   No facility-administered medications prior to visit.   Allergies  Allergen Reactions   Erythromycin Nausea And Vomiting   Lisinopril     Headaches, concern about kidney stones. No allergy to the medication.    Immunization History  Administered Date(s) Administered   Influenza,inj,Quad PF,6+ Mos 10/30/2020   Influenza-Unspecified 11/21/2018, 10/18/2019   Moderna Sars-Covid-2 Vaccination 04/12/2019, 10/18/2019    Health Maintenance  Topic Date Due   HIV Screening  Never done   Hepatitis C Screening  Never done   COLONOSCOPY (Pts 45-73yrs Insurance coverage will need to be confirmed)  Never done   COVID-19 Vaccine (3 - Moderna risk series) 11/15/2019   TETANUS/TDAP  12/21/2026   INFLUENZA VACCINE  Completed   HPV VACCINES  Aged Out    Patient Care Team: Kiondra Caicedo, Sung Amabile, NP as PCP - General (Nurse Practitioner) Janalyn Harder, MD as Consulting Physician (Dermatology)  Review of Systems All review of systems negative except what is listed in the HPI    Objective    BP 120/74   Pulse 80   Ht 5\' 10"  (1.778 m)   Wt 198 lb 9.6 oz  (90.1 kg)   SpO2 99%   BMI 28.50 kg/m  Physical Exam Vitals and nursing note reviewed.  Constitutional:      Appearance: Normal appearance. He is normal weight.  HENT:     Head: Normocephalic.  Eyes:     Extraocular Movements: Extraocular movements intact.     Conjunctiva/sclera: Conjunctivae normal.     Pupils: Pupils are equal, round, and reactive to light.  Neck:  Vascular: No carotid bruit.  Cardiovascular:     Rate and Rhythm: Normal rate and regular rhythm.     Pulses: Normal pulses.     Heart sounds: Normal heart sounds.  Pulmonary:     Effort: Pulmonary effort is normal.     Breath sounds: Normal breath sounds.  Abdominal:     General: Abdomen is flat.     Palpations: Abdomen is soft.  Musculoskeletal:        General: Normal range of motion.     Cervical back: No tenderness.     Right lower leg: No edema.     Left lower leg: No edema.  Lymphadenopathy:     Cervical: No cervical adenopathy.  Skin:    General: Skin is warm and dry.     Capillary Refill: Capillary refill takes less than 2 seconds.  Neurological:     General: No focal deficit present.     Mental Status: He is alert and oriented to person, place, and time.  Psychiatric:        Mood and Affect: Mood normal.        Behavior: Behavior normal.        Thought Content: Thought content normal.        Judgment: Judgment normal.     Depression Screen PHQ 2/9 Scores 10/30/2020 04/03/2020 02/24/2020 08/11/2016  PHQ - 2 Score 0 0 0 0  PHQ- 9 Score 2 - - -   No results found for any visits on 10/30/20.  Assessment & Plan      Problem List Items Addressed This Visit     Migraine without aura    Well controlled with propranolol BID Abortive treatment effective when used- rare needs at this time Will monitor       Relevant Medications   propranolol (INDERAL) 40 MG tablet   fenofibrate 160 MG tablet   naratriptan (AMERGE) 2.5 MG tablet   Essential hypertension - Primary    Well controlled with  propranolol BID No concerning symptoms present at this time Will continue to monitor      Relevant Medications   propranolol (INDERAL) 40 MG tablet   fenofibrate 160 MG tablet   Sleep apnea, obstructive    Endorses consistent CPAP use and improved symptoms No concerns with daytime sleepiness, or apnea since starting BP well controlled.  Will monitor      Pure hypercholesterolemia    Taking fenofibrate.  Not fasting today so will hold off on labs Dietary recommendations provided Will plan to recheck labs in March with CPE.       Relevant Medications   propranolol (INDERAL) 40 MG tablet   fenofibrate 160 MG tablet   Seborrheic keratosis of scalp    2 lesions noted to the left temple at the hairline and into the hairline. Discussed option of removal with cryotherapy in the future. He will consider new referral to dermatology No concerning findings present on examination that would warrant need for biopsy at this time.       Nausea    Intermittent. Possible GERD related.  Recommend discussing with GI when evaluated for colonoscopy for recommendations Will refill zofran as this is helpful with intermittent use and with migraine use Can consider daily PPI for management if GERD related.       Relevant Medications   ondansetron (ZOFRAN-ODT) 8 MG disintegrating tablet   Encounter for medical examination to establish care    Review of current and past medical history, social history, medication,  and family history.  Review of care gaps and health maintenance recommendations.  Records from recent providers to be requested if not available in Chart Review or Care Everywhere.  Recommendations for health maintenance, diet, and exercise provided.  Labs today: None HM Recommendations: colonoscopy, flu CPE due: March 2023       Other Visit Diagnoses     Screening for colon cancer       Relevant Orders   Ambulatory referral to Gastroenterology   Need for immunization against  influenza       Relevant Orders   Flu Vaccine QUAD 72mo+IM (Fluarix, Fluzone & Alfiuria Quad PF) (Completed)        Return for Late March for CPE and labs.    Time: 55 minutes, >50% spent counseling, care coordination, chart review, and documentation.    Taevyn Hausen, Sung Amabile, NP, DNP, AGNP-C Primary Care & Sports Medicine at [facility]

## 2020-10-30 NOTE — Assessment & Plan Note (Signed)
2 lesions noted to the left temple at the hairline and into the hairline. Discussed option of removal with cryotherapy in the future. He will consider new referral to dermatology No concerning findings present on examination that would warrant need for biopsy at this time.

## 2020-10-30 NOTE — Patient Instructions (Addendum)
Recommendations from today's visit: You can try cryotherapy for the seborrheic keratosis on the scalp. This usually comes in a small bottle with a q-tip like applicator. They are often written for wart removal. We can also do this in the office for you, if you would like.  I sent refills in for your medications.    Information on diet, exercise, and health maintenance recommendations are listed below. This is information to help you be sure you are on track for optimal health and monitoring.   Please look over this and let us know if you have any questions or if you have completed any of the health maintenance outside of [place] so that we can be sure your records are up to date.  ___________________________________________________________  Thank you for choosing [facility] for your Primary Care needs. I am excited for the opportunity to partner with you to meet your health care goals. It was a pleasure meeting you today!  I am an Adult-Geriatric Nurse Practitioner with a background in caring for patients for more than 20 years. I provide primary care and sports medicine services to patients age 82 and older within this office. I am also the director of the APP Fellowship with [facility].   I am passionate about providing the best service to you through preventive medicine and supportive care. I consider you a part of the medical team and value your input. I work diligently to ensure that you are heard and your needs are met in a safe and effective manner. I want you to feel comfortable with me as your provider and want you to know that your health concerns are important to me.  For your information, our office hours are Monday- Friday 8:00 AM - 5:00 PM At this time I am not in the office on Wednesdays.  If you have questions or concerns, please call our office at [phone] or send [place] a MyChart message and we will respond as quickly as possible.   For  all urgent or time sensitive needs we ask that you please call the office to avoid delays. MyChart is not constantly monitored and replies may take up to 72 business hours.  MyChart Policy: MyChart allows for you to see your visit notes, after visit summary, provider recommendations, lab and tests results, make an appointment, request refills, and contact your provider or the office for non-urgent questions or concerns. Providers are seeing patients during normal business hours and do not have built in time to review MyChart messages.  We ask that you allow a minimum of 4 business days for responses to [employer]. For this reason, please do not send urgent requests through [place]. Please call the office at [phone]. Complex MyChart concerns may require a visit. Your provider may request you schedule a virtual or in person visit to ensure we are providing the best care possible. MyChart messages sent after 4:00 PM on Friday will not be received by the provider until Monday morning.    Lab and Test Results: You will receive your lab and test results on MyChart as soon as they are completed and results have been sent by the lab or testing facility. Due to this service, you will receive your results BEFORE your provider.  I review lab and tests results each morning prior to seeing patients. Some results require collaboration with other providers to ensure you are receiving the most appropriate care. For this reason, we ask that you please  allow a minimum of 4 business days for your provider to receive and review lab and test results and contact you about these.  Most lab and test result comments from the provider will be sent through [place]. Your provider may recommend changes to the plan of care, follow-up visits, repeat testing, ask questions, or request an office visit to discuss these results. You may reply directly to this message or call the office at [phone] to provide information for  the provider or set up an appointment. In some instances, you will be called with test results and recommendations. Please let us know if this is preferred and we will make note of this in your chart to provide this for you.    If you have not heard a response to your lab or test results in 72 business hours, please call the office to let us know.   After Hours: For all non-emergency after hours needs, please call the office at [phone] and select the option to reach the on-call provider service. On-call services are shared between multiple [place] offices and therefore it will not be possible to speak directly with your provider. On-call providers may provide medical advice and recommendations, but are unable to provide refills for maintenance medications.  For all emergency or urgent medical needs after normal business hours, we recommend that you seek care at the closest Urgent Care or Emergency Department to ensure appropriate treatment in a timely manner.  [facility] has a 24 hour emergency room located on the ground floor for your convenience.    Please do not hesitate to reach out to [place] with concerns.   Thank you, again, for choosing me as your health care partner. I appreciate your trust and look forward to learning more about you.   Worthy Keeler, DNP, AGNP-c ___________________________________________________________  Health Maintenance Recommendations Screening Testing Mammogram Every 1 -2 years based on history and risk factors Starting at age 42 Pap Smear Ages 21-39 every 3 years Ages 1-65 every 5 years with HPV testing More frequent testing may be required based on results and history Colon Cancer Screening Every 1-10 years based on test performed, risk factors, and history Starting at age 45 Bone Density Screening Every 2-10 years based on history Starting at age 85 for women Recommendations for men differ based on medication usage,  history, and risk factors AAA Screening One time ultrasound Men 43-66 years old who have every smoked Lung Cancer Screening Low Dose Lung CT every 12 months Age 59-80 years with a 30 pack-year smoking history who still smoke or who have quit within the last 15 years  Screening Labs Routine  Labs: Complete Blood Count (CBC), Complete Metabolic Panel (CMP), Cholesterol (Lipid Panel) Every 6-12 months based on history and medications May be recommended more frequently based on current conditions or previous results Hemoglobin A1c Lab Every 3-12 months based on history and previous results Starting at age 50 or earlier with diagnosis of diabetes, high cholesterol, BMI >26, and/or risk factors Frequent monitoring for patients with diabetes to ensure blood sugar control Thyroid Panel (TSH w/ T3 & T4) Every 6 months based on history, symptoms, and risk factors May be repeated more often if on medication HIV One time testing for all patients 57 and older May be repeated more frequently for patients with increased risk factors or exposure Hepatitis C One time testing for all patients 16 and older May be repeated more frequently for patients with increased risk factors or  exposure Gonorrhea, Chlamydia Every 12 months for all sexually active persons 13-24 years Additional monitoring may be recommended for those who are considered high risk or who have symptoms PSA Men 30-11 years old with risk factors Additional screening may be recommended from age 59-69 based on risk factors, symptoms, and history  Vaccine Recommendations Tetanus Booster All adults every 10 years Flu Vaccine All patients 6 months and older every year COVID Vaccine All patients 12 years and older Initial dosing with booster May recommend additional booster based on age and health history HPV Vaccine 2 doses all patients age 59-26 Dosing may be considered for patients over 26 Shingles Vaccine (Shingrix) 2 doses all  adults 74 years and older Pneumonia (Pneumovax 23) All adults 33 years and older May recommend earlier dosing based on health history Pneumonia (Prevnar 50) All adults 70 years and older Dosed 1 year after Pneumovax 23  Additional Screening, Testing, and Vaccinations may be recommended on an individualized basis based on family history, health history, risk factors, and/or exposure.  __________________________________________________________  Diet Recommendations for All Patients  I recommend that all patients maintain a diet low in saturated fats, carbohydrates, and cholesterol. While this can be challenging at first, it is not impossible and small changes can make big differences.  Things to try: Decreasing the amount of soda, sweet tea, and/or juice to one or less per day and replace with water While water is always the first choice, if you do not like water you may consider adding a water additive without sugar to improve the taste other sugar free drinks Replace potatoes with a brightly colored vegetable at dinner Use healthy oils, such as canola oil or olive oil, instead of butter or hard margarine Limit your bread intake to two pieces or less a day Replace regular pasta with low carb pasta options Bake, broil, or grill foods instead of frying Monitor portion sizes  Eat smaller, more frequent meals throughout the day instead of large meals  An important thing to remember is, if you love foods that are not great for your health, you don't have to give them up completely. Instead, allow these foods to be a reward when you have done well. Allowing yourself to still have special treats every once in a while is a nice way to tell yourself thank you for working hard to keep yourself healthy.   Also remember that every day is a new day. If you have a bad day and "fall off the wagon", you can still climb right back up and keep moving along on your journey!  We have resources available to  help you!  Some websites that may be helpful include: www.http://carter.biz/  Www.VeryWellFit.com _____________________________________________________________  Activity Recommendations for All Patients  I recommend that all adults get at least 20 minutes of moderate physical activity that elevates your heart rate at least 5 days out of the week.  Some examples include: Walking or jogging at a pace that allows you to carry on a conversation Cycling (stationary bike or outdoors) Water aerobics Yoga Weight lifting Dancing If physical limitations prevent you from putting stress on your joints, exercise in a pool or seated in a chair are excellent options.  Do determine your MAXIMUM heart rate for activity: YOUR AGE - 220 = MAX HeartRate   Remember! Do not push yourself too hard.  Start slowly and build up your pace, speed, weight, time in exercise, etc.  Allow your body to rest between exercise and get good  sleep. You will need more water than normal when you are exerting yourself. Do not wait until you are thirsty to drink. Drink with a purpose of getting in at least 8, 8 ounce glasses of water a day plus more depending on how much you exercise and sweat.    If you begin to develop dizziness, chest pain, abdominal pain, jaw pain, shortness of breath, headache, vision changes, lightheadedness, or other concerning symptoms, stop the activity and allow your body to rest. If your symptoms are severe, seek emergency evaluation immediately. If your symptoms are concerning, but not severe, please let us know so that we can recommend further evaluation.   _______________________________________________________________

## 2020-10-30 NOTE — Assessment & Plan Note (Signed)
>>  ASSESSMENT AND PLAN FOR PURE HYPERCHOLESTEROLEMIA WRITTEN ON 10/30/2020 12:04 PM BY Brenn Deziel E, NP  Taking fenofibrate.  Not fasting today so will hold off on labs Dietary recommendations provided Will plan to recheck labs in March with CPE.

## 2020-10-30 NOTE — Assessment & Plan Note (Signed)
Well controlled with propranolol BID No concerning symptoms present at this time Will continue to monitor

## 2020-10-30 NOTE — Assessment & Plan Note (Signed)
Well controlled with propranolol BID Abortive treatment effective when used- rare needs at this time Will monitor

## 2020-10-30 NOTE — Assessment & Plan Note (Signed)
Intermittent. Possible GERD related.  Recommend discussing with GI when evaluated for colonoscopy for recommendations Will refill zofran as this is helpful with intermittent use and with migraine use Can consider daily PPI for management if GERD related.

## 2020-10-30 NOTE — Assessment & Plan Note (Signed)
Taking fenofibrate.  Not fasting today so will hold off on labs Dietary recommendations provided Will plan to recheck labs in March with CPE.

## 2020-11-09 ENCOUNTER — Other Ambulatory Visit (HOSPITAL_COMMUNITY): Payer: Self-pay

## 2020-11-23 ENCOUNTER — Telehealth (HOSPITAL_BASED_OUTPATIENT_CLINIC_OR_DEPARTMENT_OTHER): Payer: Self-pay

## 2020-11-23 NOTE — Telephone Encounter (Signed)
Patient called this morning stating that his new patient visit was coded incorrectly.  He states he and his 2 children established with [facility] on the same day and their visit were coded differently from his which caused him to owe money I informed patient I would get his concern forwarded to our Engineer, building services and if any further information was needed then she would contact him Patient is aware and agreeable.

## 2020-11-24 ENCOUNTER — Other Ambulatory Visit: Payer: Self-pay | Admitting: Gastroenterology

## 2020-11-24 ENCOUNTER — Other Ambulatory Visit (HOSPITAL_COMMUNITY): Payer: Self-pay

## 2020-11-24 DIAGNOSIS — R1310 Dysphagia, unspecified: Secondary | ICD-10-CM

## 2020-11-24 MED ORDER — CLENPIQ 10-3.5-12 MG-GM -GM/160ML PO SOLN
ORAL | 0 refills | Status: DC
  Filled 2020-11-24: qty 320, 1d supply, fill #0

## 2020-11-25 ENCOUNTER — Other Ambulatory Visit (HOSPITAL_COMMUNITY): Payer: Self-pay

## 2020-11-27 ENCOUNTER — Other Ambulatory Visit (HOSPITAL_COMMUNITY): Payer: Self-pay

## 2020-11-30 ENCOUNTER — Other Ambulatory Visit (HOSPITAL_BASED_OUTPATIENT_CLINIC_OR_DEPARTMENT_OTHER): Payer: Self-pay | Admitting: Nurse Practitioner

## 2020-11-30 MED ORDER — AMOXICILLIN-POT CLAVULANATE 875-125 MG PO TABS: 1.0000 | ORAL_TABLET | Freq: Two times a day (BID) | ORAL | 0 refills | Status: DC

## 2020-11-30 MED ORDER — BENZONATATE 200 MG PO CAPS
200.0000 mg | ORAL_CAPSULE | Freq: Three times a day (TID) | ORAL | 0 refills | Status: DC | PRN
Start: 2020-11-30 — End: 2021-04-05

## 2020-12-04 ENCOUNTER — Ambulatory Visit
Admission: RE | Admit: 2020-12-04 | Discharge: 2020-12-04 | Disposition: A | Discharge status: Home / Self Care | Payer: No Typology Code available for payment source | Source: Ambulatory Visit | Attending: Gastroenterology | Admitting: Gastroenterology

## 2020-12-04 DIAGNOSIS — R1310 Dysphagia, unspecified: Secondary | ICD-10-CM

## 2020-12-09 NOTE — Telephone Encounter (Signed)
Patient had a level 5 NP appt which the insurance covered most of the cost. Children had a NP preventative visit which is covered by insurance plan.

## 2020-12-16 ENCOUNTER — Other Ambulatory Visit (HOSPITAL_COMMUNITY): Payer: Self-pay

## 2020-12-18 ENCOUNTER — Telehealth (HOSPITAL_BASED_OUTPATIENT_CLINIC_OR_DEPARTMENT_OTHER): Payer: Self-pay

## 2020-12-22 ENCOUNTER — Other Ambulatory Visit (HOSPITAL_COMMUNITY): Payer: Self-pay

## 2020-12-22 ENCOUNTER — Other Ambulatory Visit (HOSPITAL_BASED_OUTPATIENT_CLINIC_OR_DEPARTMENT_OTHER): Payer: Self-pay | Admitting: Nurse Practitioner

## 2020-12-22 DIAGNOSIS — R109 Unspecified abdominal pain: Secondary | ICD-10-CM

## 2020-12-23 ENCOUNTER — Other Ambulatory Visit: Payer: Self-pay

## 2020-12-23 ENCOUNTER — Ambulatory Visit (INDEPENDENT_AMBULATORY_CARE_PROVIDER_SITE_OTHER): Payer: No Typology Code available for payment source | Admitting: Nurse Practitioner

## 2020-12-23 ENCOUNTER — Ambulatory Visit (HOSPITAL_BASED_OUTPATIENT_CLINIC_OR_DEPARTMENT_OTHER): Payer: No Typology Code available for payment source | Admitting: Nurse Practitioner

## 2020-12-23 DIAGNOSIS — R109 Unspecified abdominal pain: Secondary | ICD-10-CM

## 2020-12-23 NOTE — Progress Notes (Signed)
   Established Patient Nurse Visit  ------------------------------------------------------------------------------------------  Patient presents in office today for CPE labs Lab requisition printed and given to [facility] Phlebotomist Patient labs drawn by [employer]

## 2020-12-25 ENCOUNTER — Other Ambulatory Visit (HOSPITAL_BASED_OUTPATIENT_CLINIC_OR_DEPARTMENT_OTHER): Payer: Self-pay

## 2020-12-25 DIAGNOSIS — I10 Essential (primary) hypertension: Secondary | ICD-10-CM

## 2020-12-25 NOTE — Progress Notes (Signed)
Patient was unable to provide urine sample when original order was placed Patient provided urine sample today and order was placed to process

## 2020-12-26 LAB — MICROSCOPIC EXAMINATION
Bacteria, UA: NONE SEEN
Casts: NONE SEEN /lpf
Epithelial Cells (non renal): NONE SEEN /hpf (ref 0–10)
WBC, UA: NONE SEEN /hpf (ref 0–5)

## 2020-12-26 LAB — UA/M W/RFLX CULTURE, COMP
Bilirubin, UA: NEGATIVE
Glucose, UA: NEGATIVE
Ketones, UA: NEGATIVE
Leukocytes,UA: NEGATIVE
Nitrite, UA: NEGATIVE
Protein,UA: NEGATIVE
Specific Gravity, UA: 1.024 (ref 1.005–1.030)
Urobilinogen, Ur: 0.2 mg/dL (ref 0.2–1.0)
pH, UA: 7.5 (ref 5.0–7.5)

## 2020-12-29 LAB — CBC WITH DIFFERENTIAL/PLATELET
Basophils Absolute: 0.1 10*3/uL (ref 0.0–0.2)
Basos: 1 %
EOS (ABSOLUTE): 0.1 10*3/uL (ref 0.0–0.4)
Eos: 2 %
Hematocrit: 42 % (ref 37.5–51.0)
Hemoglobin: 14.6 g/dL (ref 13.0–17.7)
Immature Grans (Abs): 0 10*3/uL (ref 0.0–0.1)
Immature Granulocytes: 0 %
Lymphocytes Absolute: 2.6 10*3/uL (ref 0.7–3.1)
Lymphs: 42 %
MCH: 29.8 pg (ref 26.6–33.0)
MCHC: 34.8 g/dL (ref 31.5–35.7)
MCV: 86 fL (ref 79–97)
Monocytes Absolute: 0.6 10*3/uL (ref 0.1–0.9)
Monocytes: 9 %
Neutrophils Absolute: 2.9 10*3/uL (ref 1.4–7.0)
Neutrophils: 46 %
Platelets: 350 10*3/uL (ref 150–450)
RBC: 4.9 x10E6/uL (ref 4.14–5.80)
RDW: 12.5 % (ref 11.6–15.4)
WBC: 6.3 10*3/uL (ref 3.4–10.8)

## 2020-12-29 LAB — COMPREHENSIVE METABOLIC PANEL
ALT: 16 IU/L (ref 0–44)
AST: 19 IU/L (ref 0–40)
Albumin/Globulin Ratio: 1.7 (ref 1.2–2.2)
Albumin: 4.7 g/dL (ref 4.0–5.0)
Alkaline Phosphatase: 73 IU/L (ref 44–121)
BUN/Creatinine Ratio: 15 (ref 9–20)
BUN: 15 mg/dL (ref 6–24)
Bilirubin Total: 0.4 mg/dL (ref 0.0–1.2)
CO2: 24 mmol/L (ref 20–29)
Calcium: 9.9 mg/dL (ref 8.7–10.2)
Chloride: 100 mmol/L (ref 96–106)
Creatinine, Ser: 1.02 mg/dL (ref 0.76–1.27)
Globulin, Total: 2.8 g/dL (ref 1.5–4.5)
Glucose: 82 mg/dL (ref 70–99)
Potassium: 4 mmol/L (ref 3.5–5.2)
Sodium: 138 mmol/L (ref 134–144)
Total Protein: 7.5 g/dL (ref 6.0–8.5)
eGFR: 91 mL/min/{1.73_m2} (ref >59)

## 2020-12-29 LAB — LIPID PANEL
Chol/HDL Ratio: 7.1 ratio — ABNORMAL HIGH (ref 0.0–5.0)
Cholesterol, Total: 213 mg/dL — ABNORMAL HIGH (ref 100–199)
HDL: 30 mg/dL — ABNORMAL LOW (ref >39)
LDL Chol Calc (NIH): 103 mg/dL — ABNORMAL HIGH (ref 0–99)
Triglycerides: 468 mg/dL — ABNORMAL HIGH (ref 0–149)
VLDL Cholesterol Cal: 80 mg/dL — ABNORMAL HIGH (ref 5–40)

## 2020-12-29 LAB — UA/M W/RFLX CULTURE, COMP

## 2020-12-29 LAB — AMYLASE: Amylase: 69 U/L (ref 31–110)

## 2020-12-29 LAB — LIPASE: Lipase: 21 U/L (ref 13–78)

## 2020-12-30 ENCOUNTER — Other Ambulatory Visit (HOSPITAL_COMMUNITY): Payer: Self-pay

## 2020-12-31 NOTE — Addendum Note (Signed)
Addended by: Campbell Riches on: 12/31/2020 04:37 PM   Modules accepted: Orders

## 2021-01-01 ENCOUNTER — Other Ambulatory Visit (HOSPITAL_COMMUNITY): Payer: Self-pay

## 2021-01-22 ENCOUNTER — Telehealth (HOSPITAL_BASED_OUTPATIENT_CLINIC_OR_DEPARTMENT_OTHER): Payer: Self-pay

## 2021-01-22 NOTE — Telephone Encounter (Signed)
Patient called in to inquire if he should still have abdominal CT that is scheduled for next Friday Patient states he has had a colonoscopy and he feels that the prep in conjunction with stopping his cholesterol medication has alleviated the abdominal pain that he was previously experiencing.  Patient states he is feel much better and doesn't feel he needs to have the CT unless Worthy Keeler, DNP is concerned with something else. I advised patient that I would forward his question to Worthy Keeler, DNP and that she would get back to him the early part of next week Patient verbalized understanding and is agreeable

## 2021-01-25 NOTE — Telephone Encounter (Signed)
Per Ezequiel Essex, DNP ok to cancel CT Advised patient He is aware and agreeable to plan

## 2021-01-29 ENCOUNTER — Other Ambulatory Visit: Payer: No Typology Code available for payment source

## 2021-02-23 NOTE — Telephone Encounter (Signed)
Responded to

## 2021-02-23 NOTE — Telephone Encounter (Signed)
Duplicate encounter

## 2021-03-08 ENCOUNTER — Other Ambulatory Visit (HOSPITAL_COMMUNITY): Payer: Self-pay

## 2021-03-17 ENCOUNTER — Telehealth (HOSPITAL_BASED_OUTPATIENT_CLINIC_OR_DEPARTMENT_OTHER): Payer: Self-pay

## 2021-03-18 ENCOUNTER — Other Ambulatory Visit: Payer: Self-pay

## 2021-03-18 ENCOUNTER — Telehealth (HOSPITAL_BASED_OUTPATIENT_CLINIC_OR_DEPARTMENT_OTHER): Payer: Self-pay | Admitting: Nurse Practitioner

## 2021-03-18 ENCOUNTER — Encounter (HOSPITAL_BASED_OUTPATIENT_CLINIC_OR_DEPARTMENT_OTHER): Payer: Self-pay | Admitting: Nurse Practitioner

## 2021-03-18 ENCOUNTER — Other Ambulatory Visit (HOSPITAL_BASED_OUTPATIENT_CLINIC_OR_DEPARTMENT_OTHER): Payer: Self-pay

## 2021-03-18 ENCOUNTER — Ambulatory Visit (INDEPENDENT_AMBULATORY_CARE_PROVIDER_SITE_OTHER): Payer: No Typology Code available for payment source | Admitting: Nurse Practitioner

## 2021-03-18 VITALS — BP 118/78 | HR 62 | Resp 12

## 2021-03-18 DIAGNOSIS — K219 Gastro-esophageal reflux disease without esophagitis: Secondary | ICD-10-CM

## 2021-03-18 MED ORDER — PANTOPRAZOLE SODIUM 40 MG PO TBEC
DELAYED_RELEASE_TABLET | ORAL | 3 refills | Status: DC
  Filled 2021-03-18: qty 60, 28d supply, fill #0
  Filled 2021-05-05: qty 60, 30d supply, fill #1
  Filled 2021-06-10: qty 60, 30d supply, fill #2
  Filled 2021-07-16: qty 60, 30d supply, fill #3

## 2021-03-18 MED ORDER — SUCRALFATE 1 G PO TABS
1.0000 g | ORAL_TABLET | Freq: Three times a day (TID) | ORAL | 0 refills | Status: DC
  Filled 2021-03-18: qty 22, 5d supply, fill #0
  Filled 2021-03-18: qty 38, 10d supply, fill #0

## 2021-03-18 NOTE — Telephone Encounter (Signed)
Attempt to return patient call about symptoms of upset stomach, sternal tightness, and cough.  ?No answer. LVM for patient. If he returns call I will be happy to speak with him if I am not in with a patient.  ?

## 2021-03-18 NOTE — Patient Instructions (Signed)
I have sent in the pantoprazole 40mg  twice a day for 14 days and and once a day for at least 14 days, you may continue this if your symptoms are improved.  ? ?I have sent carafate for you to take with each meal and at bedtime for 2 weeks.  ? ?If you do not notice any difference in the next 2 weeks, please let me know and we may need to get you to see GI.  ?

## 2021-03-21 ENCOUNTER — Encounter (HOSPITAL_BASED_OUTPATIENT_CLINIC_OR_DEPARTMENT_OTHER): Payer: Self-pay | Admitting: Nurse Practitioner

## 2021-03-21 NOTE — Progress Notes (Signed)
Acute Office Visit  Subjective:    Patient ID: [name], male    DOB: 1975-02-08, 46 y.o.   MRN: [id number]  Chief Complaint  Patient presents with   Gastroesophageal Reflux    Gastroesophageal Reflux He complains of abdominal pain, belching, chest pain, coughing, heartburn and nausea. This is a recurrent problem. The current episode started 1 to 4 weeks ago. The problem occurs frequently. The problem has been unchanged. The heartburn duration is more than one hour. The heartburn is located in the substernum and abdomen. The heartburn is of moderate intensity. The heartburn wakes him from sleep. The heartburn limits his activity. The heartburn doesn't change with position. Nothing aggravates the symptoms. Pertinent negatives include no anemia, fatigue, melena, muscle weakness, orthopnea or weight loss. There are no known risk factors. He has tried an antacid for the symptoms. The treatment provided significant relief.  Patient is in today for symptoms of abdominal pain in the upper abdominal region, substernal chest pain, and nausea that started recently while at work. He reports that one of the doctors had him try an antacid which helped to relieve the symptoms fairly quickly, but they returned after several hours. He reports that he has intermittently been taking antacids for the symptoms which are helpful. He reports that the symptoms are worse in the night when he lays down. He was concerned that it could be something more serious and is planning to travel this weekend and did not want to go without a plan.   He also reports that he is concerned about low blood pressure readings. He reports his blood pressure has   Past Medical History:  Diagnosis Date   Allergy    Anxiety    Chronic neck pain    Chronic pain    DDD (degenerative disc disease)    Herniated cervical disc    Hypertension    IBS (irritable bowel syndrome)    Migraines    since 2002   Migraines    Renal calculi     Shingles    Sleep apnea    does not wear c-pap    Past Surgical History:  Procedure Laterality Date   ANTERIOR CERVICAL DECOMP/DISCECTOMY FUSION N/A 08/15/2013   Procedure: Anterior Cervical Four-Five/Five-Six/Six-Seven Decompression and Fusion;  Surgeon: Tia Alert, MD;  Location: [facility];  Service: Neurosurgery;  Laterality: N/A;   LITHOTRIPSY     WISDOM TOOTH EXTRACTION      Family History  Problem Relation Age of Onset   Headache Mother    Cancer Father        prostate and kidney cancer   Prostate cancer Father    Kidney cancer Father    Kidney Stones Sister    Migraines Sister     Social History   Socioeconomic History   Marital status: Married    Spouse name: Victorino Dike   Number of children: 3   Years of education: Not on file   Highest education level: Not on file  Occupational History    Employer: [employer]  Tobacco Use   Smoking status: Never   Smokeless tobacco: Never  Vaping Use   Vaping Use: Never used  Substance and Sexual Activity   Alcohol use: No    Comment: rarely   Drug use: No   Sexual activity: Yes    Partners: Female  Other Topics Concern   Not on file  Social History Narrative   Caffeine-Pt drinks 2 cups of caffeine daily.  Social Determinants of Health   Financial Resource Strain: Not on file  Food Insecurity: Not on file  Transportation Needs: Not on file  Physical Activity: Not on file  Stress: Not on file  Social Connections: Not on file  Intimate Partner Violence: Not on file    Outpatient Medications Prior to Visit  Medication Sig Dispense Refill   amoxicillin-clavulanate (AUGMENTIN) 875-125 MG tablet Take 1 tablet by mouth 2 (two) times daily. 10 tablet 0   benzonatate (TESSALON) 200 MG capsule Take 1 capsule (200 mg total) by mouth 3 (three) times daily as needed for cough. 30 capsule 0   cyclobenzaprine (FLEXERIL) 10 MG tablet Take 1 tablet (10 mg total) by mouth 2 (two) times daily as needed for muscle spasms.  20 tablet 0   fenofibrate 160 MG tablet Take 1 tablet (160 mg total) by mouth at bedtime. 90 tablet 3   naratriptan (AMERGE) 2.5 MG tablet Take one (1) tablet at onset of migraine; if returns or does not resolve, may repeat after 4 hours; do not exceed five (5) mg in 24 hours. 10 tablet 6   ondansetron (ZOFRAN-ODT) 8 MG disintegrating tablet Take 1 tablet (8 mg total) by mouth every 8 (eight) hours as needed for nausea. 30 tablet 5   propranolol (INDERAL) 40 MG tablet Take 1 tablet (40 mg total) by mouth 2 (two) times daily. 180 tablet 3   Sod Picosulfate-Mag Ox-Cit Acd (CLENPIQ) 10-3.5-12 MG-GM -GM/160ML SOLN Use as directed per office and not instructions on packaging. 320 mL 0   No facility-administered medications prior to visit.    Allergies  Allergen Reactions   Erythromycin Nausea And Vomiting   Lisinopril     Headaches, concern about kidney stones. No allergy to the medication.    Review of Systems  Constitutional:  Negative for fatigue and weight loss.  Respiratory:  Positive for cough.   Cardiovascular:  Positive for chest pain.  Gastrointestinal:  Positive for abdominal pain, heartburn and nausea. Negative for melena.  Musculoskeletal:  Negative for muscle weakness.  All review of systems negative except what is listed in the HPI     Objective:    Physical Exam Vitals reviewed.  Constitutional:      General: He is not in acute distress.    Appearance: He is not ill-appearing.  HENT:     Head: Normocephalic.  Eyes:     Extraocular Movements: Extraocular movements intact.     Pupils: Pupils are equal, round, and reactive to light.  Neck:     Vascular: No carotid bruit.  Cardiovascular:     Rate and Rhythm: Normal rate and regular rhythm.     Pulses: Normal pulses.     Heart sounds: Normal heart sounds.  Pulmonary:     Effort: Pulmonary effort is normal. No respiratory distress.     Breath sounds: Normal breath sounds. No wheezing or rhonchi.  Chest:     Chest  wall: No tenderness.  Abdominal:     General: Abdomen is flat. Bowel sounds are normal. There is no distension.     Palpations: Abdomen is soft. There is no mass.     Tenderness: There is abdominal tenderness in the epigastric area. There is no right CVA tenderness, left CVA tenderness, guarding or rebound.     Hernia: No hernia is present.  Musculoskeletal:        General: Normal range of motion.     Cervical back: Normal range of motion and neck supple.  Skin:    General: Skin is warm and dry.     Capillary Refill: Capillary refill takes less than 2 seconds.  Neurological:     General: No focal deficit present.     Mental Status: He is alert. He is disoriented.  Psychiatric:        Mood and Affect: Mood normal.        Behavior: Behavior normal.        Thought Content: Thought content normal.        Judgment: Judgment normal.    BP 118/78   Pulse 62   Resp 12   SpO2 99%  Wt Readings from Last 3 Encounters:  10/30/20 198 lb 9.6 oz (90.1 kg)  04/03/20 198 lb 9.6 oz (90.1 kg)  09/06/19 190 lb (86.2 kg)    Health Maintenance Due  Topic Date Due   HIV Screening  Never done   Hepatitis C Screening  Never done   COLONOSCOPY (Pts 45-18yrs Insurance coverage will need to be confirmed)  Never done   COVID-19 Vaccine (3 - Moderna risk series) 11/15/2019    There are no preventive care reminders to display for this patient.   No results found for: TSH Lab Results  Component Value Date   WBC 6.3 12/23/2020   HGB 14.6 12/23/2020   HCT 42.0 12/23/2020   MCV 86 12/23/2020   PLT 350 12/23/2020   Lab Results  Component Value Date   NA 138 12/23/2020   K 4.0 12/23/2020   CO2 24 12/23/2020   GLUCOSE 82 12/23/2020   BUN 15 12/23/2020   CREATININE 1.02 12/23/2020   BILITOT 0.4 12/23/2020   ALKPHOS 73 12/23/2020   AST 19 12/23/2020   ALT 16 12/23/2020   PROT 7.5 12/23/2020   ALBUMIN 4.7 12/23/2020   CALCIUM 9.9 12/23/2020   ANIONGAP 9 12/20/2018   EGFR 91 12/23/2020    Lab Results  Component Value Date   CHOL 213 (H) 12/23/2020   Lab Results  Component Value Date   HDL 30 (L) 12/23/2020   Lab Results  Component Value Date   LDLCALC 103 (H) 12/23/2020   Lab Results  Component Value Date   TRIG 468 (H) 12/23/2020   Lab Results  Component Value Date   CHOLHDL 7.1 (H) 12/23/2020   No results found for: HGBA1C     Assessment & Plan:   Problem List Items Addressed This Visit     Gastroesophageal reflux disease - Primary    Symptoms and presentation consistent with exacerbation of GERD. Patient is drinking an increased amount of energy drinks recently ,which are likely contributing to the symptoms.  Will send treatment today with pantoprazole and carafate.  No cardiac symptoms present today. HRRR.  Recommend decrease caffeine and energy drink intake, avoidance is best.  Take pantoprazole daily for best results.       Relevant Medications   pantoprazole (PROTONIX) 40 MG tablet   sucralfate (CARAFATE) 1 g tablet     Meds ordered this encounter  Medications   pantoprazole (PROTONIX) 40 MG tablet    Sig: Take 1 tablet (40 mg total) by mouth 2 (two) times daily for 14 days, THEN 1 tablet (40 mg total) daily for 14 days.    Dispense:  60 tablet    Refill:  3   sucralfate (CARAFATE) 1 g tablet    Sig: Take 1 tablet (1 g total) by mouth 4 (four) times daily -  with meals and at bedtime.  Dispense:  60 tablet    Refill:  0     Tollie Eth, NP

## 2021-03-21 NOTE — Assessment & Plan Note (Signed)
Symptoms and presentation consistent with exacerbation of GERD. Patient is drinking an increased amount of energy drinks recently ,which are likely contributing to the symptoms.  ?Will send treatment today with pantoprazole and carafate.  ?No cardiac symptoms present today. HRRR.  ?Recommend decrease caffeine and energy drink intake, avoidance is best.  ?Take pantoprazole daily for best results.  ?

## 2021-04-05 ENCOUNTER — Other Ambulatory Visit: Payer: Self-pay

## 2021-04-05 ENCOUNTER — Other Ambulatory Visit (HOSPITAL_COMMUNITY): Payer: Self-pay

## 2021-04-05 ENCOUNTER — Ambulatory Visit (INDEPENDENT_AMBULATORY_CARE_PROVIDER_SITE_OTHER): Payer: No Typology Code available for payment source | Admitting: Nurse Practitioner

## 2021-04-05 ENCOUNTER — Encounter (HOSPITAL_BASED_OUTPATIENT_CLINIC_OR_DEPARTMENT_OTHER): Payer: Self-pay | Admitting: Nurse Practitioner

## 2021-04-05 VITALS — BP 117/72 | HR 84 | Ht 70.0 in | Wt 200.0 lb

## 2021-04-05 DIAGNOSIS — D229 Melanocytic nevi, unspecified: Secondary | ICD-10-CM | POA: Diagnosis not present

## 2021-04-05 DIAGNOSIS — I10 Essential (primary) hypertension: Secondary | ICD-10-CM

## 2021-04-05 DIAGNOSIS — Z0000 Encounter for general adult medical examination without abnormal findings: Secondary | ICD-10-CM

## 2021-04-05 DIAGNOSIS — E559 Vitamin D deficiency, unspecified: Secondary | ICD-10-CM

## 2021-04-05 DIAGNOSIS — Z87442 Personal history of urinary calculi: Secondary | ICD-10-CM

## 2021-04-05 DIAGNOSIS — G4733 Obstructive sleep apnea (adult) (pediatric): Secondary | ICD-10-CM

## 2021-04-05 DIAGNOSIS — E781 Pure hyperglyceridemia: Secondary | ICD-10-CM

## 2021-04-05 DIAGNOSIS — K219 Gastro-esophageal reflux disease without esophagitis: Secondary | ICD-10-CM

## 2021-04-05 DIAGNOSIS — L821 Other seborrheic keratosis: Secondary | ICD-10-CM

## 2021-04-05 DIAGNOSIS — E7800 Pure hypercholesterolemia, unspecified: Secondary | ICD-10-CM

## 2021-04-05 MED ORDER — PROPRANOLOL HCL 40 MG PO TABS
40.0000 mg | ORAL_TABLET | Freq: Two times a day (BID) | ORAL | 3 refills | Status: DC
  Filled 2021-04-05: qty 180, 90d supply, fill #0
  Filled 2021-08-08: qty 180, 90d supply, fill #1
  Filled 2021-11-06 – 2021-11-15 (×2): qty 180, 90d supply, fill #2

## 2021-04-05 NOTE — Assessment & Plan Note (Signed)
History of nephrolithiasis with no alarm symptoms present today.  ?Pt reports concerns for repeat occurrence and would like evaluation with urology. Referral placed today.  ?

## 2021-04-05 NOTE — Assessment & Plan Note (Signed)
Chronic. BP well controlled today with no alarm sx.  ?Continue propranolol and monitoring for new or worsening sx.  ?Will obtain labs today for evaluation.  ?

## 2021-04-05 NOTE — Patient Instructions (Addendum)
I have sent the referral to dermatology to Dr. Denna Haggard- if you don't hear from them in the next week, I recommend calling to set something up.  ?I have sent a referral to Dermatology with [facility]. You should hear from them in a week or so.  ? ? ?For all adult patients, I recommend: ? ?A well balanced diet low in saturated fats, cholesterol, and moderation in carbohydrates.  ? This can be as simple as monitoring portion sizes and cutting back on sugary beverages such as soda and  juice to start with.   ? ?Daily water consumption of at least 64 ounces. ? ?Physical activity at least 140 minutes per week, if just starting out.  ? This can be as simple as taking the stairs instead of the elevator and walking 2-3 laps around the office  purposefully every day.  ? Increase time, distance, and speed as you progress ? Walk at a pace that you get your heart rate up, but you can still carry on a conversation.  ? Maximum heart rate when exercising is (220 - YOUR CURRENT AGE) ? ?STD protection, partner selection, and regular testing if high risk. ? ?Limited consumption of alcoholic beverages if alcohol is consumed. ? For women, I recommend no more than 7 alcoholic beverages per week, spread out throughout the week. ? Avoid "binge" drinking or consuming large quantities of alcohol in one setting.  ? Please let me know if you feel you may need help with reduction or quitting alcohol consumption.  ? ?Avoidance of nicotine, if used. ? Please let me know if you feel you may need help with reduction or quitting nicotine use.  ? ?Daily mental health attention. ? This can be in the form of 5 minute daily meditation, prayer, journaling, yoga, reflection, etc.  ? Purposeful attention to your emotions and mental state can significantly improve your overall wellbeing and Health. ? ?Please know that I am here to help you with all of your health care goals and am happy to work with you to find a solution that works best  for you.  ?The greatest advice I have received with any changes in life are to take it one step at a time, that even means if all you can focus on is the next 60 seconds, then do that and celebrate your victories.  ?With any changes in life, you will have set backs, and that is OK. The important thing to remember is, if you have a set back, it is not a failure, it is an opportunity to try again! ? ?

## 2021-04-05 NOTE — Assessment & Plan Note (Signed)
Overall healthy 46 y/o male. No alarm concerns present today.  ?UTD on HM recommendations.  ?Labs today for evaluation.  ?Recommendations to diet and exercise provided.  ?Review of medications today with refills provided.  ?F/U in 1 year or sooner if needed.  ?

## 2021-04-05 NOTE — Assessment & Plan Note (Signed)
>>  ASSESSMENT AND PLAN FOR PURE HYPERCHOLESTEROLEMIA WRITTEN ON 04/05/2021 11:59 AM BY Athan Casalino E, NP  Chronic. Labs today.  Monitoring diet and work to reduce saturated fat to 13g or less per day.  Unable to tolerate statins and fenofibrate due to muscle pain and paresthesias. Will monitor.

## 2021-04-05 NOTE — Assessment & Plan Note (Signed)
Referral to dermatology for skin evaluation today.  ?Left upper extremity with pruritic light brown nevus with well demarcated edges present as area of patient concern. Would like further evaluation to seborrheic keratoses of the scalp.  ?

## 2021-04-05 NOTE — Assessment & Plan Note (Signed)
Chronic. Using CPAP at night.  ?No concerns at this time.  ?Continue with current regimen.  ?

## 2021-04-05 NOTE — Assessment & Plan Note (Signed)
Referral to dermatology today for further evaluation along with multiple nevi present on trunk and upper extremities.  ?No concerning findings at this time.  ?

## 2021-04-05 NOTE — Assessment & Plan Note (Signed)
Chronic. Significant improvement with pantoprazole and carafate.  ?Continue current regimen and monitor for changes or new symptoms.  ?F/U PRN ?

## 2021-04-05 NOTE — Progress Notes (Signed)
? ?BP 117/72   Pulse 84   Ht '5\' 10"'$  (1.778 m)   Wt 200 lb (90.7 kg)   SpO2 94%   BMI 28.70 kg/m?   ? ?Subjective:  ? ? Patient ID: [name], male    DOB: [birth year], 46 y.o.   MRN: [id number] ? ?HPI: ?[name] is a 46 y.o. male presenting on 04/05/2021 for comprehensive medical examination.  ? ?Current medical concerns include: history of nephrolithiasis and multiple moles.  ?Nephrolithiasis ?Would like a referral to urology for evaluation and recommendations for avoidance of repeat occurrences.  ?No pain at this time, but does have some pressure and is concerned that he may have more stones present.  ? ?Multiple Nevi ?History of sunburns in the past with multiple nevi on the back and chest ?Seborrheic keratosis on the temporal region that is growing and becoming more concerning.  ?Nevus on the left arm that it itching and flaky. No color change or significant growth noted, but concerned with changes.  ? 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 ?He endorses ETOH use ( maybe one per week ) ?He denies nictoine use  ?He denies illegal substance use  ?He is sexually active with one partner, his spouse, and he denies concerns about STI today ? ?He endorses concerns about skin changes today (changed mole and see above) ?He denies concerns about bowel changes today  ?He denies concerns about bladder changes today - evaluation for nephrolithiasis hx- see above ? ?Most Recent Depression Screen:  ?[facility] 04/05/2021 10/30/2020 04/03/2020 02/24/2020 08/11/2016  ?Decreased Interest 0 0 0 0 0  ?Down, Depressed, Hopeless 0 0 0 0 0  ?PHQ - 2  Score 0 0 0 0 0  ?Altered sleeping - 0 - - -  ?Tired, decreased energy - 1 - - -  ?Change in appetite - 1 - - -  ?Feeling bad or failure about yourself  - 0 - - -  ?Trouble concentrating - 0 - - -  ?Moving slowly or fidgety/restless - 0 - - -  ?Suicidal thoughts - 0 - - -  ?PHQ-9 Score - 2 - - -  ?Difficult doing work/chores - Not difficult at all - - -  ? ?Most Recent Anxiety Screen: ?GAD 7 : Generalized Anxiety Score 10/30/2020  ?Nervous, Anxious, on Edge 1  ?Control/stop worrying 0  ?Worry too much - different things 0  ?Trouble relaxing 0  ?Restless 0  ?Easily annoyed or irritable 1  ?Afraid - awful might happen 0  ?Total GAD 7 Score 2  ?Anxiety Difficulty Not difficult at all  ? ?Most Recent Falls Screen: ?Fall Risk  04/05/2021 10/30/2020 08/11/2016  ?Falls in the past year? 0 0 Yes  ?Number falls in past yr: 0 0 -  ?Injury with Fall? 0 0 -  ?Risk for fall due to : No Fall Risks No Fall Risks -  ?Follow up Falls evaluation completed Falls evaluation completed -  ? ? ?Past medical history, surgical history, medications, allergies, family history and social history reviewed with patient today and changes made to appropriate areas of the chart.  ?Past  Medical History:  ?Past Medical History:  ?Diagnosis Date  ? Allergy   ? Anxiety   ? Cervicalgia 04/17/2012  ? Chronic neck pain   ? Chronic pain   ? DDD (degenerative disc disease)   ? Herniated cervical disc   ? Hypertension   ? IBS (irritable bowel syndrome)   ? Migraines   ? since 2002  ? Migraines   ? Renal calculi   ? Shingles   ? Sleep apnea   ? does not wear c-pap  ? Xiphoid pain 05/30/2019  ? ?Medications:  ?Current Outpatient Medications on File Prior to Visit  ?Medication Sig  ? naratriptan (AMERGE) 2.5 MG tablet Take one (1) tablet at onset of migraine; if returns or does not resolve, may repeat after 4 hours; do not exceed five (5) mg in 24 hours.  ? pantoprazole (PROTONIX) 40 MG tablet Take 1 tablet (40 mg total) by mouth 2 (two) times daily for 14 days,  THEN 1 tablet (40 mg total) daily for 14 days.  ? sucralfate (CARAFATE) 1 g tablet Take 1 tablet (1 g total) by mouth 4 (four) times daily -  with meals and at bedtime.  ? ?No current facility-administered medications on file prior to visit.  ? ?Surgical History:  ?Past Surgical History:  ?Procedure Laterality Date  ? ANTERIOR CERVICAL DECOMP/DISCECTOMY FUSION N/A 08/15/2013  ? Procedure: Anterior Cervical Four-Five/Five-Six/Six-Seven Decompression and Fusion;  Surgeon: Eustace Moore, MD;  Location: [facility];  Service: Neurosurgery;  Laterality: N/A;  ? LITHOTRIPSY    ? WISDOM TOOTH EXTRACTION    ? ?Allergies:  ?Allergies  ?Allergen Reactions  ? Erythromycin Nausea And Vomiting  ? Lisinopril   ?  Headaches, concern about kidney stones. No allergy to the medication.  ? ?Social History:  ?Social History  ? ?Socioeconomic History  ? Marital status: Married  ?  Spouse name: Anderson Malta  ? Number of children: 3  ? Years of education: Not on file  ? Highest education level: Not on file  ?Occupational History  ?  Employer: [employer]  ?Tobacco Use  ? Smoking status: Never  ? Smokeless tobacco: Never  ?Vaping Use  ? Vaping Use: Never used  ?Substance and Sexual Activity  ? Alcohol use: No  ?  Comment: rarely  ? Drug use: No  ? Sexual activity: Yes  ?  Partners: Female  ?Other Topics Concern  ? Not on file  ?Social History Narrative  ? Caffeine-Pt drinks 2 cups of caffeine daily.  ? ?Social Determinants of Health  ? ?Financial Resource Strain: Not on file  ?Food Insecurity: Not on file  ?Transportation Needs: Not on file  ?Physical Activity: Not on file  ?Stress: Not on file  ?Social Connections: Not on file  ?Intimate Partner Violence: Not on file  ? ?Social History  ? ?Tobacco Use  ?Smoking Status Never  ?Smokeless Tobacco Never  ? ?Social History  ? ?Substance and Sexual Activity  ?Alcohol Use No  ? Comment: rarely  ? ?Family History:  ?Family History  ?Problem Relation Age of Onset  ? Headache Mother   ? Cancer Father    ?     prostate and kidney cancer  ? Prostate cancer Father   ? Kidney cancer Father   ? Kidney Stones Sister   ? Migraines Sister   ? ? ? ?All ROS negative except what is listed above and in the HPI.  ? ?   ?Objective:  ?  ?BP 117/72   Pulse  84   Ht '5\' 10"'$  (1.778 m)   Wt 200 lb (90.7 kg)   SpO2 94%   BMI 28.70 kg/m?   ?Wt Readings from Last 3 Encounters:  ?04/05/21 200 lb (90.7 kg)  ?10/30/20 198 lb 9.6 oz (90.1 kg)  ?04/03/20 198 lb 9.6 oz (90.1 kg)  ?  ?Physical Exam ?Vitals and nursing note reviewed.  ?Constitutional:   ?   General: He is not in acute distress. ?   Appearance: Normal appearance.  ?HENT:  ?   Head: Normocephalic and atraumatic.  ?   Right Ear: Hearing, tympanic membrane, ear canal and external ear normal.  ?   Left Ear: Hearing, tympanic membrane, ear canal and external ear normal.  ?   Nose: Nose normal.  ?   Right Sinus: No maxillary sinus tenderness or frontal sinus tenderness.  ?   Left Sinus: No maxillary sinus tenderness or frontal sinus tenderness.  ?   Mouth/Throat:  ?   Lips: Pink.  ?   Mouth: Mucous membranes are moist.  ?   Pharynx: Oropharynx is clear.  ?Eyes:  ?   General: Lids are normal. Vision grossly intact.  ?   Extraocular Movements: Extraocular movements intact.  ?   Conjunctiva/sclera: Conjunctivae normal.  ?   Pupils: Pupils are equal, round, and reactive to light.  ?   Funduscopic exam: ?   Right eye: No hemorrhage. Red reflex present.     ?   Left eye: No hemorrhage. Red reflex present. ?   Visual Fields: Right eye visual fields normal and left eye visual fields normal.  ?Neck:  ?   Thyroid: No thyromegaly.  ?   Vascular: No carotid bruit or JVD.  ?Cardiovascular:  ?   Rate and Rhythm: Normal rate and regular rhythm.  ?   Chest Wall: PMI is not displaced.  ?   Pulses: Normal pulses.     ?     Dorsalis pedis pulses are 2+ on the right side and 2+ on the left side.  ?     Posterior tibial pulses are 2+ on the right side and 2+ on the left side.  ?   Heart sounds: Normal  heart sounds. No murmur heard. ?Pulmonary:  ?   Effort: Pulmonary effort is normal. No respiratory distress.  ?   Breath sounds: Normal breath sounds.  ?Chest:  ?Breasts: ?   Breasts are symmetrical.  ?Abdominal

## 2021-04-05 NOTE — Assessment & Plan Note (Signed)
Chronic. Labs today.  ?Monitoring diet and work to reduce saturated fat to 13g or less per day.  ?Unable to tolerate statins and fenofibrate due to muscle pain and paresthesias. Will monitor.  ?

## 2021-04-06 ENCOUNTER — Telehealth (HOSPITAL_BASED_OUTPATIENT_CLINIC_OR_DEPARTMENT_OTHER): Payer: Self-pay

## 2021-04-06 DIAGNOSIS — D229 Melanocytic nevi, unspecified: Secondary | ICD-10-CM

## 2021-04-06 LAB — CBC WITH DIFF/PLATELET
Basophils Absolute: 0.1 10*3/uL (ref 0.0–0.2)
Basos: 1 %
EOS (ABSOLUTE): 0.1 10*3/uL (ref 0.0–0.4)
Eos: 1 %
Hematocrit: 43.8 % (ref 37.5–51.0)
Hemoglobin: 15.3 g/dL (ref 13.0–17.7)
Immature Grans (Abs): 0 10*3/uL (ref 0.0–0.1)
Immature Granulocytes: 0 %
Lymphocytes Absolute: 2.6 10*3/uL (ref 0.7–3.1)
Lymphs: 36 %
MCH: 30.3 pg (ref 26.6–33.0)
MCHC: 34.9 g/dL (ref 31.5–35.7)
MCV: 87 fL (ref 79–97)
Monocytes Absolute: 0.6 10*3/uL (ref 0.1–0.9)
Monocytes: 9 %
Neutrophils Absolute: 3.7 10*3/uL (ref 1.4–7.0)
Neutrophils: 53 %
Platelets: 308 10*3/uL (ref 150–450)
RBC: 5.05 x10E6/uL (ref 4.14–5.80)
RDW: 12.5 % (ref 11.6–15.4)
WBC: 7.1 10*3/uL (ref 3.4–10.8)

## 2021-04-06 LAB — COMPREHENSIVE METABOLIC PANEL
ALT: 17 IU/L (ref 0–44)
AST: 20 IU/L (ref 0–40)
Albumin/Globulin Ratio: 1.5 (ref 1.2–2.2)
Albumin: 4.5 g/dL (ref 4.0–5.0)
Alkaline Phosphatase: 90 IU/L (ref 44–121)
BUN/Creatinine Ratio: 15 (ref 9–20)
BUN: 15 mg/dL (ref 6–24)
Bilirubin Total: 0.4 mg/dL (ref 0.0–1.2)
CO2: 24 mmol/L (ref 20–29)
Calcium: 10 mg/dL (ref 8.7–10.2)
Chloride: 99 mmol/L (ref 96–106)
Creatinine, Ser: 0.98 mg/dL (ref 0.76–1.27)
Globulin, Total: 3.1 g/dL (ref 1.5–4.5)
Glucose: 90 mg/dL (ref 70–99)
Potassium: 5 mmol/L (ref 3.5–5.2)
Sodium: 138 mmol/L (ref 134–144)
Total Protein: 7.6 g/dL (ref 6.0–8.5)
eGFR: 96 mL/min/{1.73_m2} (ref >59)

## 2021-04-06 LAB — LIPID PANEL
Chol/HDL Ratio: 6.5 ratio — ABNORMAL HIGH (ref 0.0–5.0)
Cholesterol, Total: 216 mg/dL — ABNORMAL HIGH (ref 100–199)
HDL: 33 mg/dL — ABNORMAL LOW (ref >39)
LDL Chol Calc (NIH): 98 mg/dL (ref 0–99)
Triglycerides: 506 mg/dL — ABNORMAL HIGH (ref 0–149)
VLDL Cholesterol Cal: 85 mg/dL — ABNORMAL HIGH (ref 5–40)

## 2021-04-06 LAB — THYROID PANEL WITH TSH
Free Thyroxine Index: 1.8 (ref 1.2–4.9)
T3 Uptake Ratio: 27 % (ref 24–39)
T4, Total: 6.8 ug/dL (ref 4.5–12.0)
TSH: 2.96 u[IU]/mL (ref 0.450–4.500)

## 2021-04-06 LAB — HEMOGLOBIN A1C
Est. average glucose Bld gHb Est-mCnc: 108 mg/dL
Hgb A1c MFr Bld: 5.4 % (ref 4.8–5.6)

## 2021-04-06 LAB — VITAMIN D 25 HYDROXY (VIT D DEFICIENCY, FRACTURES): Vit D, 25-Hydroxy: 18.1 ng/mL — ABNORMAL LOW (ref 30.0–100.0)

## 2021-04-06 LAB — PSA TOTAL (REFLEX TO FREE): Prostate Specific Ag, Serum: 0.8 ng/mL (ref 0.0–4.0)

## 2021-04-07 DIAGNOSIS — E781 Pure hyperglyceridemia: Secondary | ICD-10-CM | POA: Insufficient documentation

## 2021-04-07 DIAGNOSIS — E559 Vitamin D deficiency, unspecified: Secondary | ICD-10-CM | POA: Insufficient documentation

## 2021-04-07 MED ORDER — VITAMIN D (ERGOCALCIFEROL) 1.25 MG (50000 UNIT) PO CAPS
50000.0000 [IU] | ORAL_CAPSULE | ORAL | 0 refills | Status: DC
  Filled 2021-04-07: qty 12, 84d supply, fill #0

## 2021-04-07 MED ORDER — FENOFIBRATE 48 MG PO TABS
48.0000 mg | ORAL_TABLET | Freq: Every day | ORAL | 3 refills | Status: DC
  Filled 2021-04-07: qty 30, 30d supply, fill #0
  Filled 2021-05-12: qty 30, 30d supply, fill #1
  Filled 2021-06-09: qty 30, 30d supply, fill #2
  Filled 2021-06-10: qty 30, 30d supply, fill #0
  Filled 2021-07-16: qty 30, 30d supply, fill #1

## 2021-04-07 NOTE — Addendum Note (Signed)
Addended by: Hughie Melroy, Clarise Cruz E on: 04/07/2021 07:10 PM ? ? Modules accepted: Orders ? ?

## 2021-04-08 ENCOUNTER — Other Ambulatory Visit (HOSPITAL_COMMUNITY): Payer: Self-pay

## 2021-04-08 NOTE — Progress Notes (Signed)
LVM for pt to cb to sch-repeat vitamin d and lipid panel in 3 months ?

## 2021-04-12 ENCOUNTER — Other Ambulatory Visit (HOSPITAL_COMMUNITY): Payer: Self-pay

## 2021-05-05 ENCOUNTER — Other Ambulatory Visit (HOSPITAL_BASED_OUTPATIENT_CLINIC_OR_DEPARTMENT_OTHER): Payer: Self-pay

## 2021-05-12 ENCOUNTER — Other Ambulatory Visit (HOSPITAL_BASED_OUTPATIENT_CLINIC_OR_DEPARTMENT_OTHER): Payer: Self-pay

## 2021-05-14 ENCOUNTER — Other Ambulatory Visit (HOSPITAL_BASED_OUTPATIENT_CLINIC_OR_DEPARTMENT_OTHER): Payer: Self-pay

## 2021-06-10 ENCOUNTER — Other Ambulatory Visit (HOSPITAL_BASED_OUTPATIENT_CLINIC_OR_DEPARTMENT_OTHER): Payer: Self-pay

## 2021-06-10 ENCOUNTER — Other Ambulatory Visit (HOSPITAL_COMMUNITY): Payer: Self-pay

## 2021-07-09 ENCOUNTER — Ambulatory Visit (HOSPITAL_BASED_OUTPATIENT_CLINIC_OR_DEPARTMENT_OTHER): Payer: No Typology Code available for payment source

## 2021-07-16 ENCOUNTER — Other Ambulatory Visit (HOSPITAL_BASED_OUTPATIENT_CLINIC_OR_DEPARTMENT_OTHER): Payer: Self-pay

## 2021-07-23 ENCOUNTER — Other Ambulatory Visit (HOSPITAL_BASED_OUTPATIENT_CLINIC_OR_DEPARTMENT_OTHER): Payer: Self-pay

## 2021-07-23 ENCOUNTER — Ambulatory Visit (HOSPITAL_BASED_OUTPATIENT_CLINIC_OR_DEPARTMENT_OTHER): Payer: No Typology Code available for payment source

## 2021-07-23 DIAGNOSIS — R109 Unspecified abdominal pain: Secondary | ICD-10-CM

## 2021-07-23 DIAGNOSIS — Z0000 Encounter for general adult medical examination without abnormal findings: Secondary | ICD-10-CM

## 2021-07-24 LAB — LIPID PANEL
Chol/HDL Ratio: 6.6 ratio — ABNORMAL HIGH (ref 0.0–5.0)
Cholesterol, Total: 197 mg/dL (ref 100–199)
HDL: 30 mg/dL — ABNORMAL LOW (ref >39)
LDL Chol Calc (NIH): 98 mg/dL (ref 0–99)
Triglycerides: 408 mg/dL — ABNORMAL HIGH (ref 0–149)
VLDL Cholesterol Cal: 69 mg/dL — ABNORMAL HIGH (ref 5–40)

## 2021-07-24 LAB — VITAMIN D 25 HYDROXY (VIT D DEFICIENCY, FRACTURES): Vit D, 25-Hydroxy: 19.1 ng/mL — ABNORMAL LOW (ref 30.0–100.0)

## 2021-08-08 ENCOUNTER — Other Ambulatory Visit (HOSPITAL_BASED_OUTPATIENT_CLINIC_OR_DEPARTMENT_OTHER): Payer: Self-pay | Admitting: Nurse Practitioner

## 2021-08-08 DIAGNOSIS — E781 Pure hyperglyceridemia: Secondary | ICD-10-CM

## 2021-08-09 ENCOUNTER — Other Ambulatory Visit: Payer: Self-pay

## 2021-08-09 ENCOUNTER — Emergency Department (HOSPITAL_COMMUNITY): Payer: No Typology Code available for payment source

## 2021-08-09 ENCOUNTER — Encounter (HOSPITAL_COMMUNITY): Payer: Self-pay

## 2021-08-09 ENCOUNTER — Ambulatory Visit: Payer: Self-pay

## 2021-08-09 ENCOUNTER — Other Ambulatory Visit (HOSPITAL_COMMUNITY): Payer: Self-pay

## 2021-08-09 ENCOUNTER — Emergency Department (HOSPITAL_COMMUNITY)
Admission: EM | Admit: 2021-08-09 | Discharge: 2021-08-10 | Disposition: A | Discharge status: Home / Self Care | Payer: No Typology Code available for payment source | Attending: Emergency Medicine | Admitting: Emergency Medicine

## 2021-08-09 ENCOUNTER — Ambulatory Visit (HOSPITAL_COMMUNITY)
Admission: RE | Admit: 2021-08-09 | Discharge: 2021-08-09 | Disposition: A | Discharge status: Home / Self Care | Payer: No Typology Code available for payment source | Source: Ambulatory Visit | Attending: Nurse Practitioner | Admitting: Nurse Practitioner

## 2021-08-09 VITALS — BP 137/97 | HR 80 | Temp 98.9°F | Resp 18

## 2021-08-09 DIAGNOSIS — R1012 Left upper quadrant pain: Secondary | ICD-10-CM | POA: Insufficient documentation

## 2021-08-09 DIAGNOSIS — R1013 Epigastric pain: Secondary | ICD-10-CM | POA: Diagnosis not present

## 2021-08-09 LAB — COMPREHENSIVE METABOLIC PANEL
ALT: 21 U/L (ref 0–44)
AST: 21 U/L (ref 15–41)
Albumin: 4.4 g/dL (ref 3.5–5.0)
Alkaline Phosphatase: 63 U/L (ref 38–126)
Anion gap: 9 (ref 5–15)
BUN: 13 mg/dL (ref 6–20)
CO2: 26 mmol/L (ref 22–32)
Calcium: 9.8 mg/dL (ref 8.9–10.3)
Chloride: 105 mmol/L (ref 98–111)
Creatinine, Ser: 0.88 mg/dL (ref 0.61–1.24)
GFR, Estimated: >60 mL/min (ref >60)
Glucose, Bld: 89 mg/dL (ref 70–99)
Potassium: 3.7 mmol/L (ref 3.5–5.1)
Sodium: 140 mmol/L (ref 135–145)
Total Bilirubin: 0.8 mg/dL (ref 0.3–1.2)
Total Protein: 8.3 g/dL — ABNORMAL HIGH (ref 6.5–8.1)

## 2021-08-09 LAB — CBC
HCT: 43.7 % (ref 39.0–52.0)
Hemoglobin: 15.2 g/dL (ref 13.0–17.0)
MCH: 29.9 pg (ref 26.0–34.0)
MCHC: 34.8 g/dL (ref 30.0–36.0)
MCV: 85.9 fL (ref 80.0–100.0)
Platelets: 318 10*3/uL (ref 150–400)
RBC: 5.09 MIL/uL (ref 4.22–5.81)
RDW: 12.3 % (ref 11.5–15.5)
WBC: 7.1 10*3/uL (ref 4.0–10.5)
nRBC: 0 % (ref 0.0–0.2)

## 2021-08-09 LAB — LIPASE, BLOOD: Lipase: 26 U/L (ref 11–51)

## 2021-08-09 MED ORDER — FENOFIBRATE 48 MG PO TABS
48.0000 mg | ORAL_TABLET | Freq: Every day | ORAL | 3 refills | Status: DC
  Filled 2021-08-09: qty 30, 30d supply, fill #0
  Filled 2021-09-09: qty 30, 30d supply, fill #1
  Filled 2021-10-18: qty 30, 30d supply, fill #2
  Filled 2021-11-06: qty 30, 30d supply, fill #3

## 2021-08-09 MED ORDER — IOHEXOL 300 MG/ML  SOLN
100.0000 mL | Freq: Once | INTRAMUSCULAR | Status: AC | PRN
  Administered 2021-08-09: 100 mL via INTRAVENOUS

## 2021-08-09 NOTE — ED Provider Notes (Signed)
Patient presents with intermittent centralized abdominal spasming for 3 to 4 weeks.  Pain becomes severe to the point it makes it difficult to breathe and take deep breaths.  Associated abdominal bloating.  Has nausea but relates this possibly to his migraines.  Urinary frequency started around the same time of abdominal pain but unsure if related.  History of GERD, esophageal spasming, hiatal hernia.  Last colonoscopy and barium swallow December/January, negative.  Taking daily Protonix.  Denies diarrhea, constipation, increased gas production, heartburn or indigestion, chest pain or tightness, shortness of breath.  Patient is being sent to the nearest emergency department for further evaluation and need for CT imaging, on exam significant tenderness is noted to the epigastric region, there is a mass noted to left upper quadrant which is most likely his hernia and patient is visibly uncomfortable lying in exam room, vital signs are stable and he will escort self to the nearest hospital, declining to go to [facility] but endorses that he will take his health either to [facility] or [facility], NP 08/09/21 [phone]

## 2021-08-09 NOTE — ED Triage Notes (Signed)
Pt c/o upper epigastric/abdominal intermittent for a month. States has had blood work 2 wks ago with PCP but able to get in touch with her.

## 2021-08-09 NOTE — ED Provider Triage Note (Signed)
Emergency Medicine Provider Triage Evaluation Note  [name] , a 46 y.o. male  was evaluated in triage.  Pt complains of intermittent abdominal pain described as spasming that has been ongoing for 3 to 4 weeks.  Patient states the pain becomes so severe that he has difficulty breathing.  He also has some nausea.  Patient went to urgent care earlier today for his symptoms and was noted to have a mass in the left upper quadrant which is most likely previously diagnosed hiatal hernia.  Patient follows with gastroenterology and sees Dr. Collene Mares and had a negative colonoscopy and barium swallow in December/January.  He is currently taking daily Protonix.  He denies dysuria, hematuria, diarrhea and vomiting..  Review of Systems  Positive:  Negative:   Physical Exam  BP (!) 146/107 (BP Location: Left Arm)   Pulse 83   Temp 98 F (36.7 C) (Oral)   Resp 18   Ht '5\' 10"'$  (1.778 m)   Wt 92.1 kg   SpO2 99%   BMI 29.13 kg/m  Gen:   Awake, no distress   Resp:  Normal effort  MSK:   Moves extremities without difficulty  Other:  Left upper abdomen is firm, distended, palpable mass  Medical Decision Making  Medically screening exam initiated at 7:19 PM.  Appropriate orders placed.  DELONTAE LAMM was informed that the remainder of the evaluation will be completed by another provider, this initial triage assessment does not replace that evaluation, and the importance of remaining in the ED until their evaluation is complete.     [name], [place] 08/09/21 1921

## 2021-08-09 NOTE — Discharge Instructions (Signed)
Please go to the nearest emergency department for further evaluation and need for imaging, on exam you have significant tenderness to the epigastric region

## 2021-08-09 NOTE — ED Triage Notes (Signed)
Pt has intermittent epigastric/upper abdominal pain x3-4 weeks. Pt reports the is severe at times and causes him to become [facility]. Pt seen at [facility] prior to arrival and was sent here for further evaluation due to palpable mass to upper abdomen.

## 2021-08-10 ENCOUNTER — Emergency Department (HOSPITAL_COMMUNITY): Payer: No Typology Code available for payment source

## 2021-08-10 ENCOUNTER — Other Ambulatory Visit (HOSPITAL_COMMUNITY): Payer: Self-pay

## 2021-08-10 LAB — URINALYSIS, ROUTINE W REFLEX MICROSCOPIC
Bilirubin Urine: NEGATIVE
Glucose, UA: NEGATIVE mg/dL
Hgb urine dipstick: NEGATIVE
Ketones, ur: NEGATIVE mg/dL
Leukocytes,Ua: NEGATIVE
Nitrite: NEGATIVE
Protein, ur: NEGATIVE mg/dL
Specific Gravity, Urine: 1.015 (ref 1.005–1.030)
pH: 7 (ref 5.0–8.0)

## 2021-08-10 LAB — TROPONIN I (HIGH SENSITIVITY)
Troponin I (High Sensitivity): 3 ng/L (ref <18)
Troponin I (High Sensitivity): 4 ng/L (ref <18)

## 2021-08-10 MED ORDER — HYDROMORPHONE HCL 1 MG/ML IJ SOLN
0.5000 mg | Freq: Once | INTRAMUSCULAR | Status: AC
  Administered 2021-08-10: 0.5 mg via INTRAVENOUS
  Filled 2021-08-10: qty 1

## 2021-08-10 MED ORDER — OMEPRAZOLE 20 MG PO CPDR
20.0000 mg | DELAYED_RELEASE_CAPSULE | Freq: Every day | ORAL | 1 refills | Status: DC
  Filled 2021-08-10: qty 30, 30d supply, fill #0
  Filled 2021-10-12: qty 30, 30d supply, fill #1

## 2021-08-10 MED ORDER — ONDANSETRON HCL 4 MG/2ML IJ SOLN
4.0000 mg | Freq: Once | INTRAMUSCULAR | Status: AC
Start: 2021-08-10 — End: 2021-08-10
  Administered 2021-08-10: 4 mg via INTRAVENOUS
  Filled 2021-08-10: qty 2

## 2021-08-10 MED ORDER — FAMOTIDINE IN NACL 20-0.9 MG/50ML-% IV SOLN
20.0000 mg | Freq: Once | INTRAVENOUS | Status: AC
  Administered 2021-08-10: 20 mg via INTRAVENOUS
  Filled 2021-08-10: qty 50

## 2021-08-10 MED ORDER — DICYCLOMINE HCL 20 MG PO TABS
20.0000 mg | ORAL_TABLET | Freq: Two times a day (BID) | ORAL | 0 refills | Status: DC
  Filled 2021-08-10: qty 20, 10d supply, fill #0

## 2021-08-10 MED ORDER — DICYCLOMINE HCL 10 MG PO CAPS
10.0000 mg | ORAL_CAPSULE | Freq: Once | ORAL | Status: AC
  Administered 2021-08-10: 10 mg via ORAL
  Filled 2021-08-10: qty 1

## 2021-08-10 MED ORDER — SODIUM CHLORIDE 0.9 % IV BOLUS
1000.0000 mL | Freq: Once | INTRAVENOUS | Status: AC
  Administered 2021-08-10: 1000 mL via INTRAVENOUS

## 2021-08-10 MED ORDER — SUCRALFATE 1 G PO TABS
1.0000 g | ORAL_TABLET | Freq: Four times a day (QID) | ORAL | 0 refills | Status: DC | PRN
  Filled 2021-08-10: qty 30, 8d supply, fill #0

## 2021-08-10 MED ORDER — HYDROMORPHONE HCL 1 MG/ML IJ SOLN
1.0000 mg | Freq: Once | INTRAMUSCULAR | Status: AC
  Administered 2021-08-10: 1 mg via INTRAVENOUS
  Filled 2021-08-10: qty 1

## 2021-08-10 MED ORDER — HALOPERIDOL LACTATE 5 MG/ML IJ SOLN
2.0000 mg | Freq: Once | INTRAMUSCULAR | Status: AC
  Administered 2021-08-10: 2 mg via INTRAVENOUS
  Filled 2021-08-10: qty 1

## 2021-08-10 MED ORDER — ONDANSETRON 4 MG PO TBDP
4.0000 mg | ORAL_TABLET | Freq: Three times a day (TID) | ORAL | 0 refills | Status: DC | PRN
  Filled 2021-08-10: qty 20, 7d supply, fill #0

## 2021-08-10 NOTE — ED Provider Notes (Signed)
[facility] Emergency Department Provider Note MRN:  [id number]  Arrival date & time: 08/10/21     Chief Complaint   Abdominal Pain   History of Present Illness   [name] is a 46 y.o. year-old male with a history of GERD presenting to the ED with chief complaint of abdominal pain.  Epigastric, left upper quadrant abdominal pain for the past few weeks, getting worse.  Associated with nausea.  No diarrhea, no fever.  Some pain to the sternum as well.  Review of Systems  A thorough review of systems was obtained and all systems are negative except as noted in the HPI and PMH.   Patient's Health History    Past Medical History:  Diagnosis Date   Allergy    Anxiety    Cervicalgia 04/17/2012   Chronic neck pain    Chronic pain    DDD (degenerative disc disease)    Herniated cervical disc    Hypertension    IBS (irritable bowel syndrome)    Migraines    since 2002   Migraines    Renal calculi    Shingles    Sleep apnea    does not wear c-pap   Xiphoid pain 05/30/2019    Past Surgical History:  Procedure Laterality Date   ANTERIOR CERVICAL DECOMP/DISCECTOMY FUSION N/A 08/15/2013   Procedure: Anterior Cervical Four-Five/Five-Six/Six-Seven Decompression and Fusion;  Surgeon: Eustace Moore, MD;  Location: [facility];  Service: Neurosurgery;  Laterality: N/A;   LITHOTRIPSY     WISDOM TOOTH EXTRACTION      Family History  Problem Relation Age of Onset   Headache Mother    Cancer Father        prostate and kidney cancer   Prostate cancer Father    Kidney cancer Father    Kidney Stones Sister    Migraines Sister     Social History   Socioeconomic History   Marital status: Married    Spouse name: Anderson Malta   Number of children: 3   Years of education: Not on file   Highest education level: Not on file  Occupational History    Employer: [employer]  Tobacco Use   Smoking status: Never   Smokeless tobacco: Never  Vaping Use   Vaping  Use: Never used  Substance and Sexual Activity   Alcohol use: No    Comment: rarely   Drug use: No   Sexual activity: Yes    Partners: Female  Other Topics Concern   Not on file  Social History Narrative   Caffeine-Pt drinks 2 cups of caffeine daily.   Social Determinants of Health   Financial Resource Strain: Not on file  Food Insecurity: Not on file  Transportation Needs: Not on file  Physical Activity: Not on file  Stress: Not on file  Social Connections: Not on file  Intimate Partner Violence: Not on file     Physical Exam   Vitals:   08/10/21 0329 08/10/21 0415  BP:  (!) 130/97  Pulse:  82  Resp:  10  Temp: 98.8 F (37.1 C)   SpO2:  96%    CONSTITUTIONAL: Well-appearing, NAD NEURO/PSYCH:  Alert and oriented x 3, no focal deficits EYES:  eyes equal and reactive ENT/NECK:  no LAD, no JVD CARDIO: Regular rate, well-perfused, normal S1 and S2 PULM:  CTAB no wheezing or rhonchi GI/GU:  non-distended, non-tender MSK/SPINE:  No gross deformities, no edema SKIN:  no rash, atraumatic   *Additional  and/or pertinent findings included in MDM below  Diagnostic and Interventional Summary    EKG Interpretation  Date/Time:  Tuesday August 10 2021 01:02:15 EDT Ventricular Rate:  75 PR Interval:  189 QRS Duration: 99 QT Interval:  363 QTC Calculation: 406 R Axis:   40 Text Interpretation: Sinus rhythm Inferior infarct, old Confirmed by Gerlene Fee [phone]) on 08/10/2021 1:37:17 AM       Labs Reviewed  COMPREHENSIVE METABOLIC PANEL - Abnormal; Notable for the following components:      Result Value   Total Protein 8.3 (*)    All other components within normal limits  LIPASE, BLOOD  CBC  URINALYSIS, ROUTINE W REFLEX MICROSCOPIC  TROPONIN I (HIGH SENSITIVITY)  TROPONIN I (HIGH SENSITIVITY)    US Abdomen Limited RUQ (LIVER/GB)  Final Result    DG Chest Port 1 View  Final Result    CT ABDOMEN PELVIS W CONTRAST  Final Result      Medications  iohexol  (OMNIPAQUE) 300 MG/ML solution 100 mL (100 mLs Intravenous Contrast Given 08/09/21 2134)  ondansetron (ZOFRAN) injection 4 mg (4 mg Intravenous Given 08/10/21 0111)  HYDROmorphone (DILAUDID) injection 0.5 mg (0.5 mg Intravenous Given 08/10/21 0112)  famotidine (PEPCID) IVPB 20 mg premix (0 mg Intravenous Stopped 08/10/21 0310)  sodium chloride 0.9 % bolus 1,000 mL (0 mLs Intravenous Stopped 08/10/21 0310)  HYDROmorphone (DILAUDID) injection 1 mg (1 mg Intravenous Given 08/10/21 0308)  haloperidol lactate (HALDOL) injection 2 mg (2 mg Intravenous Given 08/10/21 0409)  dicyclomine (BENTYL) capsule 10 mg (10 mg Oral Given 08/10/21 0409)     Procedures  /  Critical Care Procedures  ED Course and Medical Decision Making  Initial Impression and Ddx Differential diagnosis includes GERD, esophageal spasm, less likely ACS.  Biliary pathology also considered such as biliary colic, cholecystitis.  Awaiting labs, CT.  No tachycardia, no hypoxia, no shortness of breath, no evidence of DVT, highly doubt PE.  Past medical/surgical history that increases complexity of ED encounter: None  Interpretation of Diagnostics I personally reviewed the EKG and my interpretation is as follows: Sinus rhythm, Q waves noted  Labs reveal no significant blood count or electrolyte disturbance, troponin negative x2.  CT scan is without acute process, there is cholelithiasis.  Follow-up ultrasound does not show any cholecystitis.  Patient Reassessment and Ultimate Disposition/Management     Patient feeling better after medications above.  Continued nausea, admission offered but he prefers to go home and try some oral medications and follow-up with PCP/GI.  Patient management required discussion with the following services or consulting groups:  None  Complexity of Problems Addressed Acute illness or injury that poses threat of life of bodily function  Additional Data Reviewed and Analyzed Further history obtained  from: Further history from spouse/family member  Additional Factors Impacting ED Encounter Risk Prescriptions and Consideration of hospitalization  Barth Kirks. Sedonia Small, [place] [email or web address]  Final Clinical Impressions(s) / ED Diagnoses     ICD-10-CM   1. Left upper quadrant abdominal pain  R10.12       ED Discharge Orders          Ordered    dicyclomine (BENTYL) 20 MG tablet  2 times daily        08/10/21 0521    sucralfate (CARAFATE) 1 g tablet  4 times daily PRN        08/10/21 0521    omeprazole (PRILOSEC) 20 MG capsule  Daily  08/10/21 0521    ondansetron (ZOFRAN-ODT) 4 MG disintegrating tablet  Every 8 hours PRN        08/10/21 0521             Discharge Instructions Discussed with and Provided to Patient:     Discharge Instructions      You were evaluated in the Emergency Department and after careful evaluation, we did not find any emergent condition requiring admission or further testing in the hospital.  Your exam/testing today is overall reassuring.  Recommend taking the omeprazole daily to prevent discomfort, Carafate as needed for pain.  Also recommend taking the Bentyl for pain as needed.  Can use the Zofran for nausea.  Recommend follow-up with your primary care doctor or gastroenterologist.  Please return to the Emergency Department if you experience any worsening of your condition.   Thank you for allowing [place] to be a part of your care.       Maudie Flakes, MD 08/10/21 [phone]

## 2021-08-10 NOTE — Discharge Instructions (Signed)
You were evaluated in the Emergency Department and after careful evaluation, we did not find any emergent condition requiring admission or further testing in the hospital.  Your exam/testing today is overall reassuring.  Recommend taking the omeprazole daily to prevent discomfort, Carafate as needed for pain.  Also recommend taking the Bentyl for pain as needed.  Can use the Zofran for nausea.  Recommend follow-up with your primary care doctor or gastroenterologist.  Please return to the Emergency Department if you experience any worsening of your condition.   Thank you for allowing [place] to be a part of your care.

## 2021-08-13 ENCOUNTER — Other Ambulatory Visit (HOSPITAL_BASED_OUTPATIENT_CLINIC_OR_DEPARTMENT_OTHER): Payer: Self-pay

## 2021-08-13 ENCOUNTER — Other Ambulatory Visit (HOSPITAL_COMMUNITY): Payer: Self-pay

## 2021-08-14 ENCOUNTER — Other Ambulatory Visit (HOSPITAL_COMMUNITY): Payer: Self-pay

## 2021-08-23 ENCOUNTER — Encounter (HOSPITAL_BASED_OUTPATIENT_CLINIC_OR_DEPARTMENT_OTHER): Payer: Self-pay | Admitting: Nurse Practitioner

## 2021-08-27 ENCOUNTER — Other Ambulatory Visit (HOSPITAL_BASED_OUTPATIENT_CLINIC_OR_DEPARTMENT_OTHER): Payer: Self-pay | Admitting: Nurse Practitioner

## 2021-08-27 DIAGNOSIS — L821 Other seborrheic keratosis: Secondary | ICD-10-CM

## 2021-09-01 ENCOUNTER — Encounter (HOSPITAL_BASED_OUTPATIENT_CLINIC_OR_DEPARTMENT_OTHER): Payer: Self-pay | Admitting: Nurse Practitioner

## 2021-09-09 ENCOUNTER — Other Ambulatory Visit (HOSPITAL_COMMUNITY): Payer: Self-pay

## 2021-10-05 ENCOUNTER — Other Ambulatory Visit (HOSPITAL_BASED_OUTPATIENT_CLINIC_OR_DEPARTMENT_OTHER): Payer: Self-pay

## 2021-10-05 ENCOUNTER — Other Ambulatory Visit (HOSPITAL_BASED_OUTPATIENT_CLINIC_OR_DEPARTMENT_OTHER): Payer: Self-pay | Admitting: Nurse Practitioner

## 2021-10-05 DIAGNOSIS — K219 Gastro-esophageal reflux disease without esophagitis: Secondary | ICD-10-CM

## 2021-10-11 ENCOUNTER — Other Ambulatory Visit (HOSPITAL_COMMUNITY): Payer: Self-pay

## 2021-10-11 ENCOUNTER — Other Ambulatory Visit (HOSPITAL_BASED_OUTPATIENT_CLINIC_OR_DEPARTMENT_OTHER): Payer: Self-pay | Admitting: Nurse Practitioner

## 2021-10-11 DIAGNOSIS — K219 Gastro-esophageal reflux disease without esophagitis: Secondary | ICD-10-CM

## 2021-10-12 ENCOUNTER — Other Ambulatory Visit (HOSPITAL_COMMUNITY): Payer: Self-pay

## 2021-10-12 ENCOUNTER — Other Ambulatory Visit (HOSPITAL_BASED_OUTPATIENT_CLINIC_OR_DEPARTMENT_OTHER): Payer: Self-pay

## 2021-10-13 ENCOUNTER — Other Ambulatory Visit (HOSPITAL_COMMUNITY): Payer: Self-pay

## 2021-10-18 ENCOUNTER — Other Ambulatory Visit (HOSPITAL_COMMUNITY): Payer: Self-pay

## 2021-10-18 ENCOUNTER — Other Ambulatory Visit (HOSPITAL_BASED_OUTPATIENT_CLINIC_OR_DEPARTMENT_OTHER): Payer: Self-pay

## 2021-10-18 MED ORDER — OMEPRAZOLE 20 MG PO CPDR
20.0000 mg | DELAYED_RELEASE_CAPSULE | Freq: Every day | ORAL | 1 refills | Status: DC
  Filled 2021-10-18: qty 30, 30d supply, fill #0

## 2021-10-20 MED ORDER — PANTOPRAZOLE SODIUM 40 MG PO TBEC
DELAYED_RELEASE_TABLET | ORAL | 3 refills | Status: DC
  Filled 2021-10-20: qty 60, 28d supply, fill #0
  Filled 2021-11-06: qty 60, 46d supply, fill #1

## 2021-10-21 ENCOUNTER — Other Ambulatory Visit (HOSPITAL_COMMUNITY): Payer: Self-pay

## 2021-11-06 ENCOUNTER — Other Ambulatory Visit (HOSPITAL_COMMUNITY): Payer: Self-pay

## 2021-11-06 ENCOUNTER — Other Ambulatory Visit (HOSPITAL_BASED_OUTPATIENT_CLINIC_OR_DEPARTMENT_OTHER): Payer: Self-pay | Admitting: Nurse Practitioner

## 2021-11-06 DIAGNOSIS — G43009 Migraine without aura, not intractable, without status migrainosus: Secondary | ICD-10-CM

## 2021-11-08 ENCOUNTER — Other Ambulatory Visit (HOSPITAL_COMMUNITY): Payer: Self-pay

## 2021-11-08 MED ORDER — NARATRIPTAN HCL 2.5 MG PO TABS
2.5000 mg | ORAL_TABLET | ORAL | 6 refills | Status: DC | PRN
  Filled 2021-11-08: qty 10, 30d supply, fill #0

## 2021-11-11 ENCOUNTER — Other Ambulatory Visit (HOSPITAL_COMMUNITY): Payer: Self-pay

## 2021-11-12 ENCOUNTER — Other Ambulatory Visit (HOSPITAL_COMMUNITY): Payer: Self-pay

## 2021-11-15 ENCOUNTER — Other Ambulatory Visit (HOSPITAL_COMMUNITY): Payer: Self-pay

## 2021-11-15 ENCOUNTER — Encounter (HOSPITAL_BASED_OUTPATIENT_CLINIC_OR_DEPARTMENT_OTHER): Payer: Self-pay | Admitting: Nurse Practitioner

## 2021-11-15 ENCOUNTER — Other Ambulatory Visit (HOSPITAL_BASED_OUTPATIENT_CLINIC_OR_DEPARTMENT_OTHER): Payer: Self-pay | Admitting: Nurse Practitioner

## 2021-11-15 DIAGNOSIS — E559 Vitamin D deficiency, unspecified: Secondary | ICD-10-CM

## 2021-11-15 DIAGNOSIS — K8020 Calculus of gallbladder without cholecystitis without obstruction: Secondary | ICD-10-CM

## 2021-11-15 MED ORDER — VITAMIN D (ERGOCALCIFEROL) 1.25 MG (50000 UNIT) PO CAPS
50000.0000 [IU] | ORAL_CAPSULE | ORAL | 0 refills | Status: DC
  Filled 2021-11-15: qty 12, 84d supply, fill #0

## 2021-11-19 NOTE — Addendum Note (Signed)
Addended by: Thuy Atilano, Clarise Cruz E on: 11/19/2021 08:36 AM   Modules accepted: Orders

## 2021-12-02 MED ORDER — URSODIOL 250 MG PO TABS
250.0000 mg | ORAL_TABLET | Freq: Three times a day (TID) | ORAL | 3 refills | Status: DC
  Filled 2021-12-02: qty 90, 30d supply, fill #0

## 2021-12-02 NOTE — Addendum Note (Signed)
Addended by: Abbas Beyene, Clarise Cruz E on: 12/02/2021 05:57 PM   Modules accepted: Orders

## 2021-12-03 ENCOUNTER — Telehealth (INDEPENDENT_AMBULATORY_CARE_PROVIDER_SITE_OTHER): Payer: No Typology Code available for payment source | Admitting: Nurse Practitioner

## 2021-12-03 ENCOUNTER — Other Ambulatory Visit (HOSPITAL_COMMUNITY): Payer: Self-pay

## 2021-12-03 ENCOUNTER — Encounter (HOSPITAL_BASED_OUTPATIENT_CLINIC_OR_DEPARTMENT_OTHER): Payer: Self-pay | Admitting: Nurse Practitioner

## 2021-12-03 DIAGNOSIS — E781 Pure hyperglyceridemia: Secondary | ICD-10-CM

## 2021-12-03 DIAGNOSIS — K219 Gastro-esophageal reflux disease without esophagitis: Secondary | ICD-10-CM

## 2021-12-03 DIAGNOSIS — I10 Essential (primary) hypertension: Secondary | ICD-10-CM | POA: Diagnosis not present

## 2021-12-03 DIAGNOSIS — Z6829 Body mass index (BMI) 29.0-29.9, adult: Secondary | ICD-10-CM

## 2021-12-03 DIAGNOSIS — E7800 Pure hypercholesterolemia, unspecified: Secondary | ICD-10-CM | POA: Diagnosis not present

## 2021-12-03 DIAGNOSIS — G4733 Obstructive sleep apnea (adult) (pediatric): Secondary | ICD-10-CM

## 2021-12-03 MED ORDER — PANTOPRAZOLE SODIUM 40 MG PO TBEC
40.0000 mg | DELAYED_RELEASE_TABLET | Freq: Every day | ORAL | 3 refills | Status: DC
  Filled 2021-12-03 – 2021-12-28 (×3): qty 90, 90d supply, fill #0
  Filled 2022-03-23: qty 90, 90d supply, fill #1
  Filled 2022-07-18 – 2022-07-26 (×2): qty 90, 90d supply, fill #2
  Filled 2022-10-20: qty 90, 90d supply, fill #3

## 2021-12-03 MED ORDER — ONDANSETRON 4 MG PO TBDP
4.0000 mg | ORAL_TABLET | Freq: Three times a day (TID) | ORAL | 5 refills | Status: DC | PRN
  Filled 2021-12-03: qty 20, 7d supply, fill #0

## 2021-12-03 MED ORDER — WEGOVY 0.25 MG/0.5ML ~~LOC~~ SOAJ
0.2500 mg | SUBCUTANEOUS | 0 refills | Status: DC
  Filled 2021-12-03: qty 2, 28d supply, fill #0

## 2021-12-03 MED ORDER — PROPRANOLOL HCL 40 MG PO TABS
40.0000 mg | ORAL_TABLET | Freq: Two times a day (BID) | ORAL | 3 refills | Status: DC
  Filled 2021-12-03 – 2022-02-02 (×3): qty 180, 90d supply, fill #0
  Filled 2022-05-09: qty 180, 90d supply, fill #1
  Filled 2022-08-07: qty 180, 90d supply, fill #2
  Filled 2022-11-01: qty 180, 90d supply, fill #3

## 2021-12-03 MED ORDER — FENOFIBRATE 48 MG PO TABS
48.0000 mg | ORAL_TABLET | Freq: Every day | ORAL | 3 refills | Status: DC
  Filled 2021-12-03 – 2021-12-06 (×2): qty 90, 90d supply, fill #0
  Filled 2021-12-28 – 2022-03-10 (×2): qty 90, 90d supply, fill #1
  Filled 2022-06-04: qty 90, 90d supply, fill #2
  Filled 2022-08-07 – 2022-09-01 (×2): qty 90, 90d supply, fill #3

## 2021-12-03 MED ORDER — WEGOVY 0.5 MG/0.5ML ~~LOC~~ SOAJ
0.5000 mg | SUBCUTANEOUS | 2 refills | Status: DC
  Filled 2021-12-03: qty 2, 28d supply, fill #0

## 2021-12-03 NOTE — Progress Notes (Signed)
Virtual Visit Encounter mychart visit.   I connected with  [name] on 01/13/22 at  8:10 AM EST by secure video and audio telemedicine application. I verified that I am speaking with the correct person using two identifiers.   I introduced myself as a Designer, jewellery with the practice. The limitations of evaluation and management by telemedicine discussed with the patient and the availability of in person appointments. The patient expressed verbal understanding and consent to proceed.  Participating parties in this visit include: Myself and patient  The patient is: Patient Location: Home I am: Provider Location: Office/Clinic Subjective:    CC and HPI: [name] is a 46 y.o. year old male presenting for follow up of weight. Patient reports the following: Roshard has been working on diet and exercise for weight control for several months now, but has not seen a difference in his overall body weight. He is concerned for his increased weight due to history of high cholesterol, hiatal hernia, high blood pressure, and family history. He is interested in weight management help, but has some questions about the effect this may have on other conditions.   Past medical history, Surgical history, Family history not pertinant except as noted below, Social history, Allergies, and medications have been entered into the medical record, reviewed, and corrections made.   Review of Systems:  All review of systems negative except what is listed in the HPI  Objective:    Alert and oriented x 4 Speaking in clear sentences with no shortness of breath. No distress.  Impression and Recommendations:    Problem List Items Addressed This Visit     Essential hypertension    Chronic. Managed with propranolol. No alarm sx. Diet and exercise guidance provided.      Relevant Medications   fenofibrate (TRICOR) 48 MG tablet   propranolol (INDERAL) 40 MG tablet   Semaglutide-Weight Management  (WEGOVY) 0.25 MG/0.5ML SOAJ   Semaglutide-Weight Management (WEGOVY) 0.5 MG/0.5ML SOAJ   BMI 29.0-29.9,adult    Increased weight with difficulty managing with diet and exercise alone. Discussion of medication options that may be helpful today. He is interested in [place] and does feel that this may be helpful. If we do plan to start wegovy we will need to monitor closely and increase dose at a slow rate due to history of gallstones. I am hopeful that this will help with his chronic concerns. We will plan for labs in the near future and recommend wait to start wegovy until he has 2 months of medication on hand given the current shortage.       Gastroesophageal reflux disease   Relevant Medications   pantoprazole (PROTONIX) 40 MG tablet   Pure hypercholesterolemia - Primary   Relevant Medications   fenofibrate (TRICOR) 48 MG tablet   propranolol (INDERAL) 40 MG tablet   Semaglutide-Weight Management (WEGOVY) 0.25 MG/0.5ML SOAJ   Semaglutide-Weight Management (WEGOVY) 0.5 MG/0.5ML SOAJ   Hypertriglyceridemia   Relevant Medications   fenofibrate (TRICOR) 48 MG tablet   propranolol (INDERAL) 40 MG tablet   Semaglutide-Weight Management (WEGOVY) 0.25 MG/0.5ML SOAJ   Semaglutide-Weight Management (WEGOVY) 0.5 MG/0.5ML SOAJ   Sleep apnea, obstructive   Relevant Medications   Semaglutide-Weight Management (WEGOVY) 0.25 MG/0.5ML SOAJ   Semaglutide-Weight Management (WEGOVY) 0.5 MG/0.5ML SOAJ    orders and follow up as documented in EMR I discussed the assessment and treatment plan with the patient. The patient was provided an opportunity to ask questions and all were answered. The patient  agreed with the plan and demonstrated an understanding of the instructions.   The patient was advised to call back or seek an in-person evaluation if the symptoms worsen or if the condition fails to improve as anticipated.  Follow-Up: after labs and/or imaging, consults, etc. have been completed  I provided  24 minutes of non-face-to-face interaction with this non face-to-face encounter including intake, same-day documentation, and chart review.   Orma Render, NP , DNP, AGNP-c [place] at [facility] [phone] (fax)

## 2021-12-03 NOTE — Patient Instructions (Signed)
.  [id number]

## 2021-12-06 ENCOUNTER — Other Ambulatory Visit (HOSPITAL_COMMUNITY): Payer: Self-pay

## 2021-12-08 ENCOUNTER — Encounter: Payer: Self-pay | Admitting: Nurse Practitioner

## 2021-12-10 ENCOUNTER — Other Ambulatory Visit (HOSPITAL_COMMUNITY): Payer: Self-pay

## 2021-12-17 ENCOUNTER — Other Ambulatory Visit (HOSPITAL_COMMUNITY): Payer: Self-pay

## 2021-12-25 ENCOUNTER — Telehealth: Payer: Self-pay | Admitting: Nurse Practitioner

## 2021-12-25 ENCOUNTER — Other Ambulatory Visit (HOSPITAL_COMMUNITY): Payer: Self-pay

## 2021-12-25 NOTE — Telephone Encounter (Signed)
P.A. WEGOVY completed  

## 2021-12-28 ENCOUNTER — Other Ambulatory Visit (HOSPITAL_COMMUNITY): Payer: Self-pay

## 2022-01-01 ENCOUNTER — Other Ambulatory Visit (HOSPITAL_COMMUNITY): Payer: Self-pay

## 2022-01-03 ENCOUNTER — Other Ambulatory Visit (HOSPITAL_COMMUNITY): Payer: Self-pay

## 2022-01-04 ENCOUNTER — Other Ambulatory Visit (HOSPITAL_COMMUNITY): Payer: Self-pay

## 2022-01-11 ENCOUNTER — Other Ambulatory Visit (HOSPITAL_COMMUNITY): Payer: Self-pay

## 2022-01-11 ENCOUNTER — Other Ambulatory Visit: Payer: Self-pay

## 2022-01-11 ENCOUNTER — Ambulatory Visit (INDEPENDENT_AMBULATORY_CARE_PROVIDER_SITE_OTHER): Payer: No Typology Code available for payment source | Admitting: Nurse Practitioner

## 2022-01-11 ENCOUNTER — Encounter: Payer: Self-pay | Admitting: Nurse Practitioner

## 2022-01-11 ENCOUNTER — Other Ambulatory Visit (HOSPITAL_BASED_OUTPATIENT_CLINIC_OR_DEPARTMENT_OTHER): Payer: Self-pay

## 2022-01-11 VITALS — BP 124/74 | HR 81 | Temp 98.5°F | Wt 205.8 lb

## 2022-01-11 DIAGNOSIS — E7800 Pure hypercholesterolemia, unspecified: Secondary | ICD-10-CM

## 2022-01-11 DIAGNOSIS — R1011 Right upper quadrant pain: Secondary | ICD-10-CM

## 2022-01-11 DIAGNOSIS — E559 Vitamin D deficiency, unspecified: Secondary | ICD-10-CM

## 2022-01-11 DIAGNOSIS — K8020 Calculus of gallbladder without cholecystitis without obstruction: Secondary | ICD-10-CM | POA: Insufficient documentation

## 2022-01-11 DIAGNOSIS — E781 Pure hyperglyceridemia: Secondary | ICD-10-CM

## 2022-01-11 DIAGNOSIS — Z23 Encounter for immunization: Secondary | ICD-10-CM | POA: Diagnosis not present

## 2022-01-11 DIAGNOSIS — K219 Gastro-esophageal reflux disease without esophagitis: Secondary | ICD-10-CM

## 2022-01-11 DIAGNOSIS — K760 Fatty (change of) liver, not elsewhere classified: Secondary | ICD-10-CM | POA: Insufficient documentation

## 2022-01-11 DIAGNOSIS — G43009 Migraine without aura, not intractable, without status migrainosus: Secondary | ICD-10-CM

## 2022-01-11 DIAGNOSIS — Z6829 Body mass index (BMI) 29.0-29.9, adult: Secondary | ICD-10-CM

## 2022-01-11 DIAGNOSIS — I10 Essential (primary) hypertension: Secondary | ICD-10-CM | POA: Diagnosis not present

## 2022-01-11 HISTORY — DX: Right upper quadrant pain: R10.11

## 2022-01-11 MED ORDER — ONDANSETRON HCL 8 MG PO TABS
8.0000 mg | ORAL_TABLET | Freq: Three times a day (TID) | ORAL | 2 refills | Status: DC | PRN
  Filled 2022-01-11: qty 20, 7d supply, fill #0
  Filled 2022-02-02: qty 20, 7d supply, fill #1

## 2022-01-11 MED ORDER — NARATRIPTAN HCL 2.5 MG PO TABS
2.5000 mg | ORAL_TABLET | ORAL | 6 refills | Status: DC | PRN
  Filled 2022-01-11: qty 20, 40d supply, fill #0
  Filled 2022-01-11 (×2): qty 20, 60d supply, fill #0

## 2022-01-11 NOTE — Patient Instructions (Addendum)
If you have a day where your reflux/stomach is bothering you more than normal you can take an extra dose of pantoprazole or you can take the omeprazole to see if this helps. This can help those days with pain, bloating, and gas symptoms.   We will check some labs today to make sure everything looks ok.    We have given you your tetanus booster today. This will get you up to date for that.   When you get the [facility] make sure you have the first two months of doses available before starting so you don't run out of the medication.

## 2022-01-11 NOTE — Progress Notes (Unsigned)
  Worthy Keeler, DNP, AGNP-c [place]  [address] [place], [place] [address] [phone]  ESTABLISHED PATIENT- Chronic Health and/or Follow-Up Visit  Blood pressure 124/74, pulse 81, temperature 98.5 F (36.9 C), weight 205 lb 12.8 oz (93.4 kg).    [name] is a 46 y.o. year old male presenting today for evaluation and management of the following: '@[email or web address]'$     All ROS negative with exception of what is listed above.   PHYSICAL EXAM Physical Exam  PLAN Problem List Items Addressed This Visit   None   No follow-ups on file.   Worthy Keeler, DNP, AGNP-c 01/11/2022 10:21 AM

## 2022-01-12 ENCOUNTER — Other Ambulatory Visit (HOSPITAL_COMMUNITY): Payer: Self-pay

## 2022-01-12 ENCOUNTER — Encounter: Payer: Self-pay | Admitting: Nurse Practitioner

## 2022-01-12 ENCOUNTER — Other Ambulatory Visit: Payer: Self-pay

## 2022-01-12 DIAGNOSIS — Z6829 Body mass index (BMI) 29.0-29.9, adult: Secondary | ICD-10-CM | POA: Insufficient documentation

## 2022-01-12 LAB — LIPASE: Lipase: 30 U/L (ref 13–78)

## 2022-01-12 LAB — COMPREHENSIVE METABOLIC PANEL
ALT: 18 IU/L (ref 0–44)
AST: 22 IU/L (ref 0–40)
Albumin/Globulin Ratio: 1.5 (ref 1.2–2.2)
Albumin: 4.7 g/dL (ref 4.1–5.1)
Alkaline Phosphatase: 78 IU/L (ref 44–121)
BUN/Creatinine Ratio: 18 (ref 9–20)
BUN: 14 mg/dL (ref 6–24)
Bilirubin Total: 0.5 mg/dL (ref 0.0–1.2)
CO2: 24 mmol/L (ref 20–29)
Calcium: 9.8 mg/dL (ref 8.7–10.2)
Chloride: 101 mmol/L (ref 96–106)
Creatinine, Ser: 0.77 mg/dL (ref 0.76–1.27)
Globulin, Total: 3.1 g/dL (ref 1.5–4.5)
Glucose: 92 mg/dL (ref 70–99)
Potassium: 4.6 mmol/L (ref 3.5–5.2)
Sodium: 140 mmol/L (ref 134–144)
Total Protein: 7.8 g/dL (ref 6.0–8.5)
eGFR: 110 mL/min/{1.73_m2} (ref >59)

## 2022-01-12 LAB — CBC WITH DIFFERENTIAL/PLATELET
Basophils Absolute: 0.1 10*3/uL (ref 0.0–0.2)
Basos: 1 %
EOS (ABSOLUTE): 0.1 10*3/uL (ref 0.0–0.4)
Eos: 2 %
Hematocrit: 43.6 % (ref 37.5–51.0)
Hemoglobin: 15.1 g/dL (ref 13.0–17.7)
Immature Grans (Abs): 0 10*3/uL (ref 0.0–0.1)
Immature Granulocytes: 1 %
Lymphocytes Absolute: 1.9 10*3/uL (ref 0.7–3.1)
Lymphs: 29 %
MCH: 29.8 pg (ref 26.6–33.0)
MCHC: 34.6 g/dL (ref 31.5–35.7)
MCV: 86 fL (ref 79–97)
Monocytes Absolute: 0.5 10*3/uL (ref 0.1–0.9)
Monocytes: 8 %
Neutrophils Absolute: 4 10*3/uL (ref 1.4–7.0)
Neutrophils: 59 %
Platelets: 323 10*3/uL (ref 150–450)
RBC: 5.06 x10E6/uL (ref 4.14–5.80)
RDW: 12.4 % (ref 11.6–15.4)
WBC: 6.7 10*3/uL (ref 3.4–10.8)

## 2022-01-12 LAB — LIPID PANEL
Chol/HDL Ratio: 7.2 ratio — ABNORMAL HIGH (ref 0.0–5.0)
Cholesterol, Total: 222 mg/dL — ABNORMAL HIGH (ref 100–199)
HDL: 31 mg/dL — ABNORMAL LOW (ref >39)
LDL Chol Calc (NIH): 93 mg/dL (ref 0–99)
Triglycerides: 586 mg/dL (ref 0–149)
VLDL Cholesterol Cal: 98 mg/dL — ABNORMAL HIGH (ref 5–40)

## 2022-01-12 LAB — VITAMIN D 25 HYDROXY (VIT D DEFICIENCY, FRACTURES): Vit D, 25-Hydroxy: 29.4 ng/mL — ABNORMAL LOW (ref 30.0–100.0)

## 2022-01-12 LAB — TSH: TSH: 1.17 u[IU]/mL (ref 0.450–4.500)

## 2022-01-12 LAB — HEMOGLOBIN A1C
Est. average glucose Bld gHb Est-mCnc: 114 mg/dL
Hgb A1c MFr Bld: 5.6 % (ref 4.8–5.6)

## 2022-01-12 NOTE — Assessment & Plan Note (Signed)
>>  ASSESSMENT AND PLAN FOR PURE HYPERCHOLESTEROLEMIA WRITTEN ON 01/12/2022  4:56 PM BY Gwenyth Dingee E, NP  Repeat labs today.

## 2022-01-12 NOTE — Assessment & Plan Note (Signed)
Repeat labs today

## 2022-01-12 NOTE — Assessment & Plan Note (Signed)
Chronic. Well controlled on current regimen. No alarm sx present. Tolerating medication well. We will update labs today. Refills provided.

## 2022-01-12 NOTE — Assessment & Plan Note (Signed)
BMI 29.53 at visit today. I have sent in the order for semaglutide for weight management. We will need to plan to monitor closely for gallbladder symptoms while on this medication. I am hopeful that this will help with both weight and lipid control as this has been quite elevated in the past. We will monitor closely.

## 2022-01-12 NOTE — Assessment & Plan Note (Addendum)
Chronic. Intermittent control on pantoprazole.  We discussed the option of increasing the pantoprazole to twice a day dosing for the next 14 to 30 days to see if this helps with improvement of symptoms.  To ensure are not elevated at this time.  We may need to consider surgical evaluation for hiatal hernia if symptoms do not improve with treatment.

## 2022-01-12 NOTE — Assessment & Plan Note (Signed)
Refill provided today. No current concerns or symptoms. Well controlled.

## 2022-01-13 ENCOUNTER — Other Ambulatory Visit: Payer: Self-pay

## 2022-01-13 MED ORDER — OMEGA-3-ACID ETHYL ESTERS 1 G PO CAPS
1.0000 g | ORAL_CAPSULE | Freq: Two times a day (BID) | ORAL | 2 refills | Status: DC
  Filled 2022-01-13: qty 60, 30d supply, fill #0
  Filled 2022-02-22 – 2022-03-07 (×3): qty 60, 30d supply, fill #1
  Filled 2022-04-12: qty 60, 30d supply, fill #2

## 2022-01-13 NOTE — Assessment & Plan Note (Signed)
Increased weight with difficulty managing with diet and exercise alone. Discussion of medication options that may be helpful today. He is interested in [place] and does feel that this may be helpful. If we do plan to start wegovy we will need to monitor closely and increase dose at a slow rate due to history of gallstones. I am hopeful that this will help with his chronic concerns. We will plan for labs in the near future and recommend wait to start wegovy until he has 2 months of medication on hand given the current shortage.

## 2022-01-13 NOTE — Assessment & Plan Note (Signed)
Chronic. Managed with propranolol. No alarm sx. Diet and exercise guidance provided.

## 2022-01-13 NOTE — Addendum Note (Signed)
Addended by: Buffey Zabinski, Clarise Cruz E on: 01/13/2022 08:29 AM   Modules accepted: Orders

## 2022-01-14 ENCOUNTER — Telehealth: Payer: Self-pay | Admitting: Nurse Practitioner

## 2022-01-14 NOTE — Telephone Encounter (Signed)
Pt wants to make sure ok to take fish oil you prescribed with his other meds, specifically the protonix  Please call or send MY Chart message

## 2022-01-15 ENCOUNTER — Encounter: Payer: Self-pay | Admitting: Nurse Practitioner

## 2022-01-15 ENCOUNTER — Other Ambulatory Visit (HOSPITAL_COMMUNITY): Payer: Self-pay

## 2022-01-15 MED ORDER — VITAMIN D (ERGOCALCIFEROL) 1.25 MG (50000 UNIT) PO CAPS
50000.0000 [IU] | ORAL_CAPSULE | ORAL | 1 refills | Status: DC
  Filled 2022-01-15 – 2022-02-09 (×2): qty 12, 84d supply, fill #0
  Filled 2022-05-09: qty 12, 84d supply, fill #1

## 2022-01-15 NOTE — Addendum Note (Signed)
Addended by: Aliyana Dlugosz, Clarise Cruz E on: 01/15/2022 02:45 PM   Modules accepted: Orders

## 2022-01-18 ENCOUNTER — Other Ambulatory Visit (HOSPITAL_COMMUNITY): Payer: Self-pay

## 2022-01-19 ENCOUNTER — Encounter: Payer: Self-pay | Admitting: Nurse Practitioner

## 2022-01-19 DIAGNOSIS — E7800 Pure hypercholesterolemia, unspecified: Secondary | ICD-10-CM

## 2022-01-19 DIAGNOSIS — E781 Pure hyperglyceridemia: Secondary | ICD-10-CM

## 2022-01-19 DIAGNOSIS — Z6829 Body mass index (BMI) 29.0-29.9, adult: Secondary | ICD-10-CM

## 2022-01-19 DIAGNOSIS — G4733 Obstructive sleep apnea (adult) (pediatric): Secondary | ICD-10-CM

## 2022-01-19 DIAGNOSIS — K760 Fatty (change of) liver, not elsewhere classified: Secondary | ICD-10-CM

## 2022-01-19 DIAGNOSIS — I10 Essential (primary) hypertension: Secondary | ICD-10-CM

## 2022-01-20 MED ORDER — TIRZEPATIDE-WEIGHT MANAGEMENT 2.5 MG/0.5ML ~~LOC~~ SOAJ
2.5000 mg | SUBCUTANEOUS | 0 refills | Status: DC
  Filled 2022-01-20: qty 2, 28d supply, fill #0
  Filled 2022-02-22: qty 2, 28d supply, fill #1

## 2022-01-21 ENCOUNTER — Encounter: Payer: Self-pay | Admitting: Nurse Practitioner

## 2022-01-21 ENCOUNTER — Other Ambulatory Visit (HOSPITAL_COMMUNITY): Payer: Self-pay

## 2022-01-21 NOTE — Telephone Encounter (Signed)
Wegovy approved til 07/16/22, sent mychart message regarding pharmacy change  SaraBeth please see the mychart message I sent pt

## 2022-01-26 ENCOUNTER — Other Ambulatory Visit (HOSPITAL_COMMUNITY): Payer: Self-pay

## 2022-01-27 ENCOUNTER — Other Ambulatory Visit (HOSPITAL_COMMUNITY): Payer: Self-pay

## 2022-01-31 ENCOUNTER — Encounter: Payer: Self-pay | Admitting: Nurse Practitioner

## 2022-02-02 ENCOUNTER — Other Ambulatory Visit: Payer: Self-pay

## 2022-02-02 ENCOUNTER — Other Ambulatory Visit (HOSPITAL_COMMUNITY): Payer: Self-pay

## 2022-02-07 ENCOUNTER — Encounter: Payer: Self-pay | Admitting: Nurse Practitioner

## 2022-02-08 ENCOUNTER — Other Ambulatory Visit (HOSPITAL_COMMUNITY): Payer: Self-pay

## 2022-02-09 ENCOUNTER — Other Ambulatory Visit: Payer: Self-pay

## 2022-02-10 ENCOUNTER — Encounter: Payer: Self-pay | Admitting: Nurse Practitioner

## 2022-02-14 NOTE — Telephone Encounter (Signed)
Called pt to follow up & he did start the Zepbound 1 dose & everything going well.  He get it at [facility] & paid $40

## 2022-02-22 ENCOUNTER — Other Ambulatory Visit (HOSPITAL_COMMUNITY): Payer: Self-pay

## 2022-02-22 ENCOUNTER — Other Ambulatory Visit: Payer: Self-pay | Admitting: Nurse Practitioner

## 2022-02-22 DIAGNOSIS — Z6829 Body mass index (BMI) 29.0-29.9, adult: Secondary | ICD-10-CM

## 2022-02-22 DIAGNOSIS — E7800 Pure hypercholesterolemia, unspecified: Secondary | ICD-10-CM

## 2022-02-22 DIAGNOSIS — I10 Essential (primary) hypertension: Secondary | ICD-10-CM

## 2022-02-22 DIAGNOSIS — E781 Pure hyperglyceridemia: Secondary | ICD-10-CM

## 2022-02-22 DIAGNOSIS — K760 Fatty (change of) liver, not elsewhere classified: Secondary | ICD-10-CM

## 2022-02-22 DIAGNOSIS — G4733 Obstructive sleep apnea (adult) (pediatric): Secondary | ICD-10-CM

## 2022-02-23 ENCOUNTER — Other Ambulatory Visit (HOSPITAL_COMMUNITY): Payer: Self-pay

## 2022-02-23 MED ORDER — ZEPBOUND 2.5 MG/0.5ML ~~LOC~~ SOAJ
2.5000 mg | SUBCUTANEOUS | 0 refills | Status: DC
  Filled 2022-02-23 – 2022-03-02 (×2): qty 2, 28d supply, fill #0

## 2022-02-24 ENCOUNTER — Other Ambulatory Visit (HOSPITAL_COMMUNITY): Payer: Self-pay

## 2022-02-28 ENCOUNTER — Encounter: Payer: Self-pay | Admitting: Nurse Practitioner

## 2022-03-01 ENCOUNTER — Encounter: Payer: Self-pay | Admitting: Nurse Practitioner

## 2022-03-01 ENCOUNTER — Other Ambulatory Visit (HOSPITAL_COMMUNITY): Payer: Self-pay

## 2022-03-02 ENCOUNTER — Other Ambulatory Visit: Payer: Self-pay

## 2022-03-06 ENCOUNTER — Telehealth: Payer: Self-pay | Admitting: Nurse Practitioner

## 2022-03-06 NOTE — Telephone Encounter (Signed)
P.A. Ernestine Conrad 3 ETHYL ESTERS completed & states active P.A. approved til 03/02/23, sent mychart message

## 2022-03-07 ENCOUNTER — Other Ambulatory Visit: Payer: Self-pay

## 2022-03-10 ENCOUNTER — Other Ambulatory Visit (HOSPITAL_COMMUNITY): Payer: Self-pay

## 2022-03-16 ENCOUNTER — Encounter: Payer: Self-pay | Admitting: Nurse Practitioner

## 2022-03-23 ENCOUNTER — Other Ambulatory Visit (HOSPITAL_COMMUNITY): Payer: Self-pay

## 2022-03-23 ENCOUNTER — Other Ambulatory Visit: Payer: Self-pay | Admitting: Nurse Practitioner

## 2022-03-23 DIAGNOSIS — I10 Essential (primary) hypertension: Secondary | ICD-10-CM

## 2022-03-23 DIAGNOSIS — E7800 Pure hypercholesterolemia, unspecified: Secondary | ICD-10-CM

## 2022-03-23 DIAGNOSIS — G4733 Obstructive sleep apnea (adult) (pediatric): Secondary | ICD-10-CM

## 2022-03-23 DIAGNOSIS — K760 Fatty (change of) liver, not elsewhere classified: Secondary | ICD-10-CM

## 2022-03-23 DIAGNOSIS — Z6829 Body mass index (BMI) 29.0-29.9, adult: Secondary | ICD-10-CM

## 2022-03-23 DIAGNOSIS — E781 Pure hyperglyceridemia: Secondary | ICD-10-CM

## 2022-03-23 MED ORDER — ZEPBOUND 10 MG/0.5ML ~~LOC~~ SOAJ
10.0000 mg | SUBCUTANEOUS | 0 refills | Status: DC
  Filled 2022-03-23 – 2022-06-20 (×2): qty 2, 28d supply, fill #0

## 2022-03-23 MED ORDER — ZEPBOUND 5 MG/0.5ML ~~LOC~~ SOAJ
5.0000 mg | SUBCUTANEOUS | 0 refills | Status: DC
  Filled 2022-03-23 – 2022-03-25 (×3): qty 2, 28d supply, fill #0

## 2022-03-23 MED ORDER — ZEPBOUND 10 MG/0.5ML ~~LOC~~ SOAJ
10.0000 mg | SUBCUTANEOUS | 0 refills | Status: DC
  Filled 2022-03-23: qty 2, 28d supply, fill #0

## 2022-03-23 MED ORDER — ZEPBOUND 7.5 MG/0.5ML ~~LOC~~ SOAJ
7.5000 mg | SUBCUTANEOUS | 0 refills | Status: DC
  Filled 2022-03-23: qty 2, 28d supply, fill #0

## 2022-03-23 MED ORDER — ZEPBOUND 5 MG/0.5ML ~~LOC~~ SOAJ
5.0000 mg | SUBCUTANEOUS | 0 refills | Status: DC
  Filled 2022-03-23: qty 2, 28d supply, fill #0

## 2022-03-23 MED ORDER — ZEPBOUND 7.5 MG/0.5ML ~~LOC~~ SOAJ
7.5000 mg | SUBCUTANEOUS | 0 refills | Status: DC
  Filled 2022-03-23 – 2022-04-22 (×2): qty 2, 28d supply, fill #0

## 2022-03-24 ENCOUNTER — Other Ambulatory Visit (HOSPITAL_COMMUNITY): Payer: Self-pay

## 2022-03-24 ENCOUNTER — Other Ambulatory Visit: Payer: Self-pay

## 2022-03-25 ENCOUNTER — Other Ambulatory Visit (HOSPITAL_COMMUNITY): Payer: Self-pay

## 2022-03-25 ENCOUNTER — Other Ambulatory Visit: Payer: Self-pay

## 2022-04-08 ENCOUNTER — Other Ambulatory Visit (HOSPITAL_COMMUNITY): Payer: Self-pay

## 2022-04-08 ENCOUNTER — Ambulatory Visit (INDEPENDENT_AMBULATORY_CARE_PROVIDER_SITE_OTHER): Payer: 59 | Admitting: Nurse Practitioner

## 2022-04-08 ENCOUNTER — Other Ambulatory Visit: Payer: Self-pay

## 2022-04-08 ENCOUNTER — Encounter: Payer: Self-pay | Admitting: Nurse Practitioner

## 2022-04-08 VITALS — BP 120/78 | HR 68 | Ht 70.0 in | Wt 195.4 lb

## 2022-04-08 DIAGNOSIS — I10 Essential (primary) hypertension: Secondary | ICD-10-CM | POA: Diagnosis not present

## 2022-04-08 DIAGNOSIS — K219 Gastro-esophageal reflux disease without esophagitis: Secondary | ICD-10-CM | POA: Diagnosis not present

## 2022-04-08 DIAGNOSIS — E559 Vitamin D deficiency, unspecified: Secondary | ICD-10-CM | POA: Diagnosis not present

## 2022-04-08 DIAGNOSIS — G4733 Obstructive sleep apnea (adult) (pediatric): Secondary | ICD-10-CM | POA: Diagnosis not present

## 2022-04-08 DIAGNOSIS — E781 Pure hyperglyceridemia: Secondary | ICD-10-CM | POA: Diagnosis not present

## 2022-04-08 DIAGNOSIS — H04123 Dry eye syndrome of bilateral lacrimal glands: Secondary | ICD-10-CM

## 2022-04-08 DIAGNOSIS — Z0000 Encounter for general adult medical examination without abnormal findings: Secondary | ICD-10-CM | POA: Diagnosis not present

## 2022-04-08 DIAGNOSIS — K760 Fatty (change of) liver, not elsewhere classified: Secondary | ICD-10-CM | POA: Diagnosis not present

## 2022-04-08 DIAGNOSIS — Z6829 Body mass index (BMI) 29.0-29.9, adult: Secondary | ICD-10-CM | POA: Diagnosis not present

## 2022-04-08 DIAGNOSIS — R3915 Urgency of urination: Secondary | ICD-10-CM

## 2022-04-08 DIAGNOSIS — F419 Anxiety disorder, unspecified: Secondary | ICD-10-CM | POA: Diagnosis not present

## 2022-04-08 LAB — POCT URINALYSIS DIP (CLINITEK)
Bilirubin, UA: NEGATIVE
Blood, UA: NEGATIVE
Glucose, UA: NEGATIVE mg/dL
Ketones, POC UA: NEGATIVE mg/dL
Leukocytes, UA: NEGATIVE
Nitrite, UA: NEGATIVE
POC PROTEIN,UA: NEGATIVE
Spec Grav, UA: 1.01 (ref 1.010–1.025)
Urobilinogen, UA: 0.2 E.U./dL
pH, UA: 6.5 (ref 5.0–8.0)

## 2022-04-08 MED ORDER — OLOPATADINE HCL 0.2 % OP SOLN
2.0000 [drp] | Freq: Every day | OPHTHALMIC | 6 refills | Status: DC | PRN
  Filled 2022-04-08: qty 2.5, 25d supply, fill #0

## 2022-04-08 NOTE — Progress Notes (Unsigned)
BP 120/78   Pulse 68   Ht 5\' 10"  (1.778 m)   Wt 195 lb 6.4 oz (88.6 kg)   BMI 28.04 kg/m    Subjective:    Patient ID: [name], male    DOB: 09/26/1975, 47 y.o.   MRN: [id number]  HPI: [name] is a 47 y.o. male presenting on 04/08/2022 for comprehensive medical examination.   Ab presents today for his CPE.   He expresses concerns regarding allergies and expresses dissatisfaction with the discontinuation of Zepbound coverage by his insurance. He has observed an improvement in energy levels and overall well-being since commencing these medications, alongside a weight loss of 10 pounds. The patient's blood pressure is reported to be well-controlled. He is eager to learn what his cholesterol is looking like since being on the medication for a little over a month.   He does have some stress as he is actively supporting his sister, who is currently undergoing cancer treatment, and is committed to his own health and daily activity.    Pertinent items are noted in HPI.  IMMUNIZATIONS:   Flu: Flu vaccine completed elsewhere this season Prevnar 13: Prevnar 13 N/A for this patient Pneumovax 23: Pneumovax 23 N/A for this patient Prevnar 20: Prevnar 20 N/A for this patient HPV: HPV N/A for this patient Vac Shingrix: Shingrix N/A for this patient Tetanus: Tetanus completed in the last 10 years Covid-19 vaccine status: Completed vaccines  HEALTH MAINTENANCE: Colon Cancer Screening HM Status: is up to date STI Testing HM Status: was declined  PSA Screen HM Status: is up to date Lung Ca Screen HM Status: is not applicable for this patient AAA Screen HM Status: is not applicable for this patient  He reports regular vision exams q1-5y: Yes  He reports regular dental exams q 57m:  Yes  The patient eats a regular, healthy diet. He endorses exercise and/or activity of: Active 30 + min 4+ x/wk Mode: walking, running, lifting, playing sports.   He currently: Marital Status:  married Living situation: with family Sexual: monogamous Occupation: [employer]:   Most Recent Depression Screen:     04/08/2022    8:18 AM 04/05/2021   11:49 AM 10/30/2020    9:31 AM 04/03/2020    9:42 AM 02/24/2020    9:01 AM  Depression screen PHQ 2/9  Decreased Interest 0 0 0 0 0  Down, Depressed, Hopeless 0 0 0 0 0  PHQ - 2 Score 0 0 0 0 0  Altered sleeping   0    Tired, decreased energy   1    Change in appetite   1    Feeling bad or failure about yourself    0    Trouble concentrating   0    Moving slowly or fidgety/restless   0    Suicidal thoughts   0    PHQ-9 Score   2    Difficult doing work/chores   Not difficult at all     Most Recent Anxiety Screen:    10/30/2020    9:32 AM  GAD 7 : Generalized Anxiety Score  Nervous, Anxious, on Edge 1  Control/stop worrying 0  Worry too much - different things 0  Trouble relaxing 0  Restless 0  Easily annoyed or irritable 1  Afraid - awful might happen 0  Total GAD 7 Score 2  Anxiety Difficulty Not difficult at all   Most Recent Falls Screen:    04/08/2022  8:18 AM 04/05/2021   11:49 AM 10/30/2020    9:31 AM 08/11/2016    8:49 AM  Fall Risk   Falls in the past year? 0 0 0 Yes  Number falls in past yr: 0 0 0   Injury with Fall? 0 0 0   Risk for fall due to : No Fall Risks No Fall Risks No Fall Risks   Follow up Falls evaluation completed Falls evaluation completed Falls evaluation completed     Past medical history, surgical history, medications, allergies, family history and social history reviewed with patient today and changes made to appropriate areas of the chart.  Past Medical History:  Past Medical History:  Diagnosis Date   Allergy    Anxiety    Cervicalgia 04/17/2012   Chronic neck pain    Chronic pain    DDD (degenerative disc disease)    Herniated cervical disc    Hypertension    IBS (irritable bowel syndrome)    Lumbar pain with radiation down left leg 01/30/2014   Migraines    since 2002    Migraines    Nausea 10/30/2020   Renal calculi    Shingles    Sleep apnea    does not wear c-pap   Stressful life events affecting family and household 05/30/2019   Xiphoid pain 05/30/2019   Medications:  Current Outpatient Medications on File Prior to Visit  Medication Sig   fenofibrate (TRICOR) 48 MG tablet Take 1 tablet (48 mg total) by mouth daily.   naratriptan (AMERGE) 2.5 MG tablet Take 1 tablet at onset of migraine; if returns or does not resolve, may repeat after 4 hours; do not exceed 2 tablets in 24 hours.   omega-3 acid ethyl esters (LOVAZA) 1 g capsule Take 1 capsule (1 g total) by mouth 2 (two) times daily.   ondansetron (ZOFRAN) 8 MG tablet Take 1 tablet (8 mg total) by mouth every 8 (eight) hours as needed for nausea or vomiting.   pantoprazole (PROTONIX) 40 MG tablet Take 1 tablet (40 mg total) by mouth daily.   propranolol (INDERAL) 40 MG tablet Take 1 tablet (40 mg total) by mouth 2 (two) times daily.   tirzepatide (ZEPBOUND) 10 MG/0.5ML Pen Inject 10 mg into the skin once a week.   tirzepatide (ZEPBOUND) 5 MG/0.5ML Pen Inject 5 mg into the skin once a week.   tirzepatide (ZEPBOUND) 7.5 MG/0.5ML Pen Inject 7.5 mg into the skin once a week.   Vitamin D, Ergocalciferol, (DRISDOL) 1.25 MG (50000 UNIT) CAPS capsule Take 1 capsule (50,000 Units total) by mouth every 7 (seven) days. Take for 6 months. We will then plan to recheck your labs   No current facility-administered medications on file prior to visit.   Surgical History:  Past Surgical History:  Procedure Laterality Date   ANTERIOR CERVICAL DECOMP/DISCECTOMY FUSION N/A 08/15/2013   Procedure: Anterior Cervical Four-Five/Five-Six/Six-Seven Decompression and Fusion;  Surgeon: Eustace Moore, MD;  Location: [facility];  Service: Neurosurgery;  Laterality: N/A;   LITHOTRIPSY     WISDOM TOOTH EXTRACTION     Allergies:  Allergies  Allergen Reactions   Erythromycin Nausea And Vomiting   Lisinopril     Headaches,  concern about kidney stones. No allergy to the medication.   Social History:  Social History   Socioeconomic History   Marital status: Married    Spouse name: Anderson Malta   Number of children: 3   Years of education: Not on file   Highest education  level: Not on file  Occupational History    Employer: [employer]  Tobacco Use   Smoking status: Never   Smokeless tobacco: Never  Vaping Use   Vaping Use: Never used  Substance and Sexual Activity   Alcohol use: No    Comment: rarely   Drug use: No   Sexual activity: Yes    Partners: Female  Other Topics Concern   Not on file  Social History Narrative   Caffeine-Pt drinks 2 cups of caffeine daily.   Social Determinants of Health   Financial Resource Strain: Not on file  Food Insecurity: Not on file  Transportation Needs: Not on file  Physical Activity: Not on file  Stress: Not on file  Social Connections: Not on file  Intimate Partner Violence: Not on file   Social History   Tobacco Use  Smoking Status Never  Smokeless Tobacco Never   Social History   Substance and Sexual Activity  Alcohol Use No   Comment: rarely   Family History:  Family History  Problem Relation Age of Onset   Headache Mother    Cancer Father        prostate and kidney cancer   Prostate cancer Father    Kidney cancer Father    Kidney Stones Sister    Migraines Sister        Objective:    BP 120/78   Pulse 68   Ht 5\' 10"  (1.778 m)   Wt 195 lb 6.4 oz (88.6 kg)   BMI 28.04 kg/m   Wt Readings from Last 3 Encounters:  04/08/22 195 lb 6.4 oz (88.6 kg)  01/11/22 205 lb 12.8 oz (93.4 kg)  08/09/21 203 lb (92.1 kg)    Physical Exam Vitals and nursing note reviewed.  Constitutional:      General: He is not in acute distress.    Appearance: Normal appearance.  HENT:     Head: Normocephalic and atraumatic.     Right Ear: Hearing, tympanic membrane, ear canal and external ear normal.     Left Ear: Hearing, tympanic membrane, ear  canal and external ear normal.     Nose: Nose normal.     Right Sinus: No maxillary sinus tenderness or frontal sinus tenderness.     Left Sinus: No maxillary sinus tenderness or frontal sinus tenderness.     Mouth/Throat:     Lips: Pink.     Mouth: Mucous membranes are moist.     Pharynx: Oropharynx is clear.  Eyes:     General: Lids are normal. Vision grossly intact.     Extraocular Movements: Extraocular movements intact.     Conjunctiva/sclera: Conjunctivae normal.     Pupils: Pupils are equal, round, and reactive to light.     Funduscopic exam:    Right eye: No hemorrhage. Red reflex present.        Left eye: No hemorrhage. Red reflex present.    Visual Fields: Right eye visual fields normal and left eye visual fields normal.  Neck:     Thyroid: No thyromegaly.     Vascular: No carotid bruit or JVD.  Cardiovascular:     Rate and Rhythm: Normal rate and regular rhythm.     Chest Wall: PMI is not displaced.     Pulses: Normal pulses.          Dorsalis pedis pulses are 2+ on the right side and 2+ on the left side.       Posterior tibial pulses are  2+ on the right side and 2+ on the left side.     Heart sounds: Normal heart sounds. No murmur heard. Pulmonary:     Effort: Pulmonary effort is normal. No respiratory distress.     Breath sounds: Normal breath sounds.  Chest:  Breasts:    Breasts are symmetrical.  Abdominal:     General: Bowel sounds are normal. There is no distension or abdominal bruit.     Palpations: Abdomen is soft. There is no hepatomegaly, splenomegaly or mass.     Tenderness: There is no abdominal tenderness. There is no right CVA tenderness, left CVA tenderness, guarding or rebound.  Musculoskeletal:        General: Normal range of motion.     Cervical back: Full passive range of motion without pain, normal range of motion and neck supple. No tenderness. No spinous process tenderness or muscular tenderness.     Right lower leg: No edema.     Left lower  leg: No edema.  Feet:     Right foot:     Toenail Condition: Right toenails are normal.     Left foot:     Toenail Condition: Left toenails are normal.  Lymphadenopathy:     Cervical: No cervical adenopathy.     Upper Body:     Right upper body: No supraclavicular adenopathy.     Left upper body: No supraclavicular adenopathy.  Skin:    General: Skin is warm and dry.     Capillary Refill: Capillary refill takes less than 2 seconds.     Nails: There is no clubbing.  Neurological:     General: No focal deficit present.     Mental Status: He is alert and oriented to person, place, and time.     Cranial Nerves: No cranial nerve deficit.     Sensory: Sensation is intact. No sensory deficit.     Motor: Motor function is intact. No weakness.     Coordination: Coordination is intact. Coordination normal.     Gait: Gait is intact. Gait normal.  Psychiatric:        Attention and Perception: Attention normal.        Mood and Affect: Mood normal.        Speech: Speech normal.        Behavior: Behavior normal. Behavior is cooperative.        Thought Content: Thought content normal.        Cognition and Memory: Cognition and memory normal.        Judgment: Judgment normal.     Results for orders placed or performed in visit on 04/08/22  Comprehensive metabolic panel  Result Value Ref Range   Glucose 88 70 - 99 mg/dL   BUN 13 6 - 24 mg/dL   Creatinine, Ser 1.06 0.76 - 1.27 mg/dL   eGFR 87 >59 mL/min/1.73   BUN/Creatinine Ratio 12 9 - 20   Sodium 140 134 - 144 mmol/L   Potassium 5.3 (H) 3.5 - 5.2 mmol/L   Chloride 101 96 - 106 mmol/L   CO2 24 20 - 29 mmol/L   Calcium 10.3 (H) 8.7 - 10.2 mg/dL   Total Protein 7.8 6.0 - 8.5 g/dL   Albumin 4.9 4.1 - 5.1 g/dL   Globulin, Total 2.9 1.5 - 4.5 g/dL   Albumin/Globulin Ratio 1.7 1.2 - 2.2   Bilirubin Total 0.5 0.0 - 1.2 mg/dL   Alkaline Phosphatase 71 44 - 121 IU/L   AST 16 0 -  40 IU/L   ALT 16 0 - 44 IU/L  Lipid panel  Result Value  Ref Range   Cholesterol, Total 205 (H) 100 - 199 mg/dL   Triglycerides 351 (H) 0 - 149 mg/dL   HDL 29 (L) >39 mg/dL   VLDL Cholesterol Cal 61 (H) 5 - 40 mg/dL   LDL Chol Calc (NIH) 115 (H) 0 - 99 mg/dL   Chol/HDL Ratio 7.1 (H) 0.0 - 5.0 ratio  PSA, total and free  Result Value Ref Range   Prostate Specific Ag, Serum 0.9 0.0 - 4.0 ng/mL   PSA, Free 0.33 N/A ng/mL   PSA, Free Pct 36.7 %  POCT URINALYSIS DIP (CLINITEK)  Result Value Ref Range   Color, UA yellow yellow   Clarity, UA clear clear   Glucose, UA negative negative mg/dL   Bilirubin, UA negative negative   Ketones, POC UA negative negative mg/dL   Spec Grav, UA 1.010 1.010 - 1.025   Blood, UA negative negative   pH, UA 6.5 5.0 - 8.0   POC PROTEIN,UA negative negative, trace   Urobilinogen, UA 0.2 0.2 or 1.0 E.U./dL   Nitrite, UA Negative Negative   Leukocytes, UA Negative Negative      Assessment & Plan:   Problem List Items Addressed This Visit     Essential hypertension - Primary    Blood pressure readings are well-controlled at the time of the visit. No alarm symptoms are present. Currently managed with propranolol.  Plan: - Continue with the current antihypertensive medication regimen. - Advise regular blood pressure monitoring.      Relevant Orders   Comprehensive metabolic panel (Completed)   Lipid panel (Completed)   Sleep apnea, obstructive    He is working on weight loss with the aid of Zepbound and strict exercise routine. He feels that he has been sleeping better and has more energy since losing 10lbs.  Plan: - continue to work on [employer]. You are doing great!      Relevant Orders   Comprehensive metabolic panel (Completed)   Lipid panel (Completed)   Anxiety    Anxiety appears to be well controlled at this time with no concerning symptoms.  Plan: - continue to monitor for signs of anxiety and report these immediately. Exercise is a great way to help maintain mental health.        Relevant Orders   Comprehensive metabolic panel (Completed)   Lipid panel (Completed)   Gastroesophageal reflux disease    Improvement in reflux symptoms has been reported by the patient with pantoprazole Plan: - Continue with the prescribed medication. - Patient should report any worsening of symptoms.       Hypertriglyceridemia    The patient is currently taking Lovaza and plans to switch to over-the-counter omega-3 fatty acids after the prescription ends. Cholesterol and triglyceride levels will be assessed with labs drawn today. Historically these have been very high, but he has been hesitant to start a statin due to other health concerns.  Plan: - Continue with the current medication until the prescription concludes. Continue fenofibrate. - Continue to work on diet and exercise      Relevant Orders   Comprehensive metabolic panel (Completed)   Lipid panel (Completed)   Vitamin D deficiency    The patient has 6-9 doses of vitamin D supplement remaining. Plan: - continue with the current vitamin D supplementation. - Plan to recheck vitamin D levels at a subsequent visit.      Relevant Orders  Comprehensive metabolic panel (Completed)   Lipid panel (Completed)   Fatty liver    Halo is currently working on diet and exercise to help reduce concerns with fatty liver. No alarm symptoms are present.  Plan: - continue the great work.       Relevant Orders   Comprehensive metabolic panel (Completed)   Lipid panel (Completed)   BMI 29.0-29.9,adult    he patient has achieved a 10-pound weight loss and reports increased energy levels while using Zepbound. However, these medications are no longer covered by insurance. We discussed the importance of continuing with diet and exercise.  Plan: - Great job with your weight loss so far! I am proud of you.  - We will monitor changes in insurance coverage and update you accordingly.       Relevant Orders   Comprehensive metabolic  panel (Completed)   Lipid panel (Completed)   Dry eyes, bilateral    The patient is experiencing significant discomfort from allergies. The current allergy medications will be continued, and the patient is advised to follow up if there is no improvement or if symptoms worsen. Plan: - Maintain the use of current allergy medications. - Monitor symptoms and advise the patient to report any exacerbation.      Relevant Medications   Olopatadine HCl 0.2 % SOLN   Other Visit Diagnoses     Urinary urgency       Relevant Orders   POCT URINALYSIS DIP (CLINITEK) (Completed)   PSA, total and free (Completed)        Follow up plan: NEXT PREVENTATIVE PHYSICAL DUE IN 1 YEAR. Return in about 1 year (around 04/08/2023) for CPE.  LABORATORY TESTING:  Health maintenance labs ordered today, if applicable.   PATIENT COUNSELING:   For all adult patients, I recommend A well balanced diet low in saturated fats, cholesterol, and moderation in carbohydrates.   This can be as simple as monitoring portion sizes and cutting back on sugary beverages such as soda and juice to start with.    Daily water consumption of at least 64 ounces.  Physical activity at least 180 minutes per week, if just starting out.   This can be as simple as taking the stairs instead of the elevator and walking 2-3 laps around the office  purposefully every day.   STD protection, partner selection, and regular testing if high risk.  Limited consumption of alcoholic beverages if alcohol is consumed.  For women, I recommend no more than 7 alcoholic beverages per week, spread out throughout the week.  Avoid "binge" drinking or consuming large quantities of alcohol in one setting.   Please let me know if you feel you may need help with reduction or quitting alcohol consumption.   Avoidance of nicotine, if used.  Please let me know if you feel you may need help with reduction or quitting nicotine use.   Daily mental health  attention.  This can be in the form of 5 minute daily meditation, prayer, journaling, yoga, reflection, etc.   Purposeful attention to your emotions and mental state can significantly improve your overall wellbeing  and  Health.  Please know that I am here to help you with all of your health care goals and am happy to work with you to find a solution that works best for you.  The greatest advice I have received with any changes in life are to take it one step at a time, that even means if all you can focus  on is the next 60 seconds, then do that and celebrate your victories.  With any changes in life, you will have set backs, and that is OK. The important thing to remember is, if you have a set back, it is not a failure, it is an opportunity to try again!  Health Maintenance Recommendations Screening Testing Mammogram Every 1 -2 years based on history and risk factors Starting at age 73 Pap Smear Ages 21-39 every 3 years Ages 17-65 every 5 years with HPV testing More frequent testing may be required based on results and history Colon Cancer Screening Every 1-10 years based on test performed, risk factors, and history Starting at age 71 Bone Density Screening Every 2-10 years based on history Starting at age 5 for women Recommendations for men differ based on medication usage, history, and risk factors AAA Screening One time ultrasound Men 89-53 years old who have every smoked Lung Cancer Screening Low Dose Lung CT every 12 months Age 74-80 years with a 30 pack-year smoking history who still smoke or who have quit within the last 15 years  Screening Labs Routine  Labs: Complete Blood Count (CBC), Complete Metabolic Panel (CMP), Cholesterol (Lipid Panel) Every 6-12 months based on history and medications May be recommended more frequently based on current conditions or previous results Hemoglobin A1c Lab Every 3-12 months based on history and previous results Starting at age 72 or  earlier with diagnosis of diabetes, high cholesterol, BMI >26, and/or risk factors Frequent monitoring for patients with diabetes to ensure blood sugar control Thyroid Panel (TSH w/ T3 & T4) Every 6 months based on history, symptoms, and risk factors May be repeated more often if on medication HIV One time testing for all patients 37 and older May be repeated more frequently for patients with increased risk factors or exposure Hepatitis C One time testing for all patients 60 and older May be repeated more frequently for patients with increased risk factors or exposure Gonorrhea, Chlamydia Every 12 months for all sexually active persons 13-24 years Additional monitoring may be recommended for those who are considered high risk or who have symptoms PSA Men 7-71 years old with risk factors Additional screening may be recommended from age 35-69 based on risk factors, symptoms, and history  Vaccine Recommendations Tetanus Booster All adults every 10 years Flu Vaccine All patients 6 months and older every year COVID Vaccine All patients 12 years and older Initial dosing with booster May recommend additional booster based on age and health history HPV Vaccine 2 doses all patients age 12-26 Dosing may be considered for patients over 26 Shingles Vaccine (Shingrix) 2 doses all adults 95 years and older Pneumonia (Pneumovax 23) All adults 61 years and older May recommend earlier dosing based on health history Pneumonia (Prevnar 30) All adults 36 years and older Dosed 1 year after Pneumovax 23  Additional Screening, Testing, and Vaccinations may be recommended on an individualized basis based on family history, health history, risk factors, and/or exposure.

## 2022-04-08 NOTE — Patient Instructions (Addendum)
Finish the Lovaza prescription and then switch to over the counter Omega 3 fatty acids.   Everything looks wonderful today!! I am so proud of you!!  For your sister: Boost Gwyneth Revels is an option to try if she cannot get enough calories, protein, and vitamins. This is like a clear juice that can be better tolerated.

## 2022-04-09 LAB — COMPREHENSIVE METABOLIC PANEL
ALT: 16 IU/L (ref 0–44)
AST: 16 IU/L (ref 0–40)
Albumin/Globulin Ratio: 1.7 (ref 1.2–2.2)
Albumin: 4.9 g/dL (ref 4.1–5.1)
Alkaline Phosphatase: 71 IU/L (ref 44–121)
BUN/Creatinine Ratio: 12 (ref 9–20)
BUN: 13 mg/dL (ref 6–24)
Bilirubin Total: 0.5 mg/dL (ref 0.0–1.2)
CO2: 24 mmol/L (ref 20–29)
Calcium: 10.3 mg/dL — ABNORMAL HIGH (ref 8.7–10.2)
Chloride: 101 mmol/L (ref 96–106)
Creatinine, Ser: 1.06 mg/dL (ref 0.76–1.27)
Globulin, Total: 2.9 g/dL (ref 1.5–4.5)
Glucose: 88 mg/dL (ref 70–99)
Potassium: 5.3 mmol/L — ABNORMAL HIGH (ref 3.5–5.2)
Sodium: 140 mmol/L (ref 134–144)
Total Protein: 7.8 g/dL (ref 6.0–8.5)
eGFR: 87 mL/min/{1.73_m2} (ref >59)

## 2022-04-09 LAB — LIPID PANEL
Chol/HDL Ratio: 7.1 ratio — ABNORMAL HIGH (ref 0.0–5.0)
Cholesterol, Total: 205 mg/dL — ABNORMAL HIGH (ref 100–199)
HDL: 29 mg/dL — ABNORMAL LOW (ref >39)
LDL Chol Calc (NIH): 115 mg/dL — ABNORMAL HIGH (ref 0–99)
Triglycerides: 351 mg/dL — ABNORMAL HIGH (ref 0–149)
VLDL Cholesterol Cal: 61 mg/dL — ABNORMAL HIGH (ref 5–40)

## 2022-04-09 LAB — PSA, TOTAL AND FREE
PSA, Free Pct: 36.7 %
PSA, Free: 0.33 ng/mL
Prostate Specific Ag, Serum: 0.9 ng/mL (ref 0.0–4.0)

## 2022-04-13 ENCOUNTER — Encounter: Payer: Self-pay | Admitting: Nurse Practitioner

## 2022-04-13 ENCOUNTER — Other Ambulatory Visit (HOSPITAL_COMMUNITY): Payer: Self-pay

## 2022-04-13 DIAGNOSIS — H04123 Dry eye syndrome of bilateral lacrimal glands: Secondary | ICD-10-CM | POA: Insufficient documentation

## 2022-04-13 NOTE — Assessment & Plan Note (Signed)
The patient is experiencing significant discomfort from allergies. The current allergy medications will be continued, and the patient is advised to follow up if there is no improvement or if symptoms worsen. Plan: - Maintain the use of current allergy medications. - Monitor symptoms and advise the patient to report any exacerbation.

## 2022-04-13 NOTE — Assessment & Plan Note (Signed)
Anxiety appears to be well controlled at this time with no concerning symptoms.  Plan: - continue to monitor for signs of anxiety and report these immediately. Exercise is a great way to help maintain mental health.

## 2022-04-13 NOTE — Assessment & Plan Note (Signed)
The patient is currently taking Lovaza and plans to switch to over-the-counter omega-3 fatty acids after the prescription ends. Cholesterol and triglyceride levels will be assessed with labs drawn today. Historically these have been very high, but he has been hesitant to start a statin due to other health concerns.  Plan: - Continue with the current medication until the prescription concludes. Continue fenofibrate. - Continue to work on diet and exercise

## 2022-04-13 NOTE — Assessment & Plan Note (Signed)
Improvement in reflux symptoms has been reported by the patient with pantoprazole Plan: - Continue with the prescribed medication. - Patient should report any worsening of symptoms.

## 2022-04-13 NOTE — Assessment & Plan Note (Signed)
Blood pressure readings are well-controlled at the time of the visit. No alarm symptoms are present. Currently managed with propranolol.  Plan: - Continue with the current antihypertensive medication regimen. - Advise regular blood pressure monitoring.

## 2022-04-13 NOTE — Assessment & Plan Note (Signed)
The patient has 6-9 doses of vitamin D supplement remaining. Plan: - continue with the current vitamin D supplementation. - Plan to recheck vitamin D levels at a subsequent visit.

## 2022-04-13 NOTE — Assessment & Plan Note (Signed)
He is working on weight loss with the aid of Zepbound and strict exercise routine. He feels that he has been sleeping better and has more energy since losing 10lbs.  Plan: - continue to work on [employer]. You are doing great!

## 2022-04-13 NOTE — Assessment & Plan Note (Signed)
[name] is currently working on diet and exercise to help reduce concerns with fatty liver. No alarm symptoms are present.  Plan: - continue the great work.

## 2022-04-13 NOTE — Assessment & Plan Note (Signed)
he patient has achieved a 10-pound weight loss and reports increased energy levels while using Zepbound. However, these medications are no longer covered by insurance. We discussed the importance of continuing with diet and exercise.  Plan: - Great job with your weight loss so far! I am proud of you.  - We will monitor changes in insurance coverage and update you accordingly.

## 2022-04-21 ENCOUNTER — Other Ambulatory Visit: Payer: Self-pay

## 2022-04-22 ENCOUNTER — Other Ambulatory Visit (HOSPITAL_COMMUNITY): Payer: Self-pay

## 2022-05-09 ENCOUNTER — Encounter: Payer: Self-pay | Admitting: Nurse Practitioner

## 2022-05-09 ENCOUNTER — Other Ambulatory Visit: Payer: Self-pay

## 2022-05-09 ENCOUNTER — Other Ambulatory Visit: Payer: Self-pay | Admitting: Nurse Practitioner

## 2022-05-09 DIAGNOSIS — E781 Pure hyperglyceridemia: Secondary | ICD-10-CM

## 2022-05-09 MED ORDER — OMEGA-3-ACID ETHYL ESTERS 1 G PO CAPS
1.0000 g | ORAL_CAPSULE | Freq: Two times a day (BID) | ORAL | 5 refills | Status: DC
  Filled 2022-05-09: qty 60, 30d supply, fill #0
  Filled 2022-06-15: qty 60, 30d supply, fill #1
  Filled 2022-07-18 – 2022-07-26 (×2): qty 60, 30d supply, fill #2
  Filled 2022-08-29: qty 60, 30d supply, fill #3
  Filled 2022-10-03: qty 60, 30d supply, fill #4
  Filled 2022-11-01: qty 60, 30d supply, fill #5

## 2022-06-04 ENCOUNTER — Other Ambulatory Visit (HOSPITAL_COMMUNITY): Payer: Self-pay

## 2022-06-09 ENCOUNTER — Other Ambulatory Visit: Payer: Self-pay

## 2022-06-09 ENCOUNTER — Other Ambulatory Visit (HOSPITAL_COMMUNITY): Payer: Self-pay

## 2022-06-09 DIAGNOSIS — E7800 Pure hypercholesterolemia, unspecified: Secondary | ICD-10-CM

## 2022-06-09 DIAGNOSIS — K760 Fatty (change of) liver, not elsewhere classified: Secondary | ICD-10-CM

## 2022-06-09 DIAGNOSIS — E781 Pure hyperglyceridemia: Secondary | ICD-10-CM

## 2022-06-09 DIAGNOSIS — Z6829 Body mass index (BMI) 29.0-29.9, adult: Secondary | ICD-10-CM

## 2022-06-09 DIAGNOSIS — G4733 Obstructive sleep apnea (adult) (pediatric): Secondary | ICD-10-CM

## 2022-06-09 DIAGNOSIS — I10 Essential (primary) hypertension: Secondary | ICD-10-CM

## 2022-06-09 MED ORDER — ZEPBOUND 5 MG/0.5ML ~~LOC~~ SOAJ
5.0000 mg | SUBCUTANEOUS | 0 refills | Status: DC
  Filled 2022-06-09 – 2022-06-20 (×2): qty 2, 28d supply, fill #0

## 2022-06-11 ENCOUNTER — Other Ambulatory Visit (HOSPITAL_COMMUNITY): Payer: Self-pay

## 2022-06-14 ENCOUNTER — Other Ambulatory Visit (HOSPITAL_COMMUNITY): Payer: Self-pay

## 2022-06-14 ENCOUNTER — Ambulatory Visit
Admission: EM | Admit: 2022-06-14 | Discharge: 2022-06-14 | Disposition: A | Discharge status: Home / Self Care | Payer: 59 | Attending: Nurse Practitioner | Admitting: Nurse Practitioner

## 2022-06-14 DIAGNOSIS — J069 Acute upper respiratory infection, unspecified: Secondary | ICD-10-CM | POA: Insufficient documentation

## 2022-06-14 DIAGNOSIS — Z1152 Encounter for screening for COVID-19: Secondary | ICD-10-CM | POA: Diagnosis not present

## 2022-06-14 LAB — POCT INFLUENZA A/B
Influenza A, POC: NEGATIVE
Influenza B, POC: NEGATIVE

## 2022-06-14 MED ORDER — BENZONATATE 100 MG PO CAPS: 100.0000 mg | ORAL_CAPSULE | Freq: Three times a day (TID) | ORAL | 0 refills | Status: DC | PRN

## 2022-06-14 NOTE — Discharge Instructions (Addendum)
You have a viral upper respiratory infection.  Symptoms should improve over the next week to 10 days.  If you develop chest pain or shortness of breath, go to the emergency room.  We have tested you today for COVID-19.  You will see the results in Mychart and we will call you with positive results.  Please stay home and isolate until you are aware of the results.    Some things that can make you feel better are: - Increased rest - Increasing fluid with water/sugar free electrolytes - Acetaminophen and ibuprofen as needed for fever/pain - Salt water gargling, chloraseptic spray and throat lozenges for sore throat - OTC guaifenesin (Mucinex) 600 mg twice daily for congestion - Saline sinus flushes or a neti pot - Humidifying the air -Tessalon Perles as needed for dry cough 

## 2022-06-14 NOTE — ED Provider Notes (Signed)
[facility]    CSN: [id number] Arrival date & time: 06/14/22  1805      History   Chief Complaint No chief complaint on file.   HPI [name] is a 47 y.o. male.   Patient presents today for 2-day history of bodyaches, dry cough, shortness of breath after coughing and with activity, sore throat, headache, left ear pain without drainage, abdominal pain and nausea, decreased appetite, and fatigue.  He denies known fevers, congestion, runny nose, nasal congestion, vomiting, and diarrhea.  No known sick contacts.  Has been taking over-the-counter Alka-Seltzer cold and sinus without much benefit.    Past Medical History:  Diagnosis Date   Allergy    Anxiety    Cervicalgia 04/17/2012   Chronic neck pain    Chronic pain    DDD (degenerative disc disease)    Herniated cervical disc    Hypertension    IBS (irritable bowel syndrome)    Lumbar pain with radiation down left leg 01/30/2014   Migraines    since 2002   Migraines    Nausea 10/30/2020   Renal calculi    Shingles    Sleep apnea    does not wear c-pap   Stressful life events affecting family and household 05/30/2019   Xiphoid pain 05/30/2019    Patient Active Problem List   Diagnosis Date Noted   Dry eyes, bilateral 04/13/2022   BMI 29.0-29.9,adult 01/12/2022   Cholelithiasis without obstruction 01/11/2022   Fatty liver 01/11/2022   Right upper quadrant pain 01/11/2022   Vitamin D deficiency 04/07/2021   Numerous skin moles 04/05/2021   History of nephrolithiasis 04/05/2021   Seborrheic keratosis of scalp 10/30/2020   Encounter for annual physical exam 10/30/2020   Hypertriglyceridemia 10/30/2020   Anxiety 05/30/2019   Gastroesophageal reflux disease 05/30/2019   Essential hypertension 08/11/2016   Sleep apnea, obstructive 08/11/2016   S/P cervical spinal fusion 08/15/2013   Migraine without aura 04/17/2012    Past Surgical History:  Procedure Laterality Date   ANTERIOR CERVICAL  DECOMP/DISCECTOMY FUSION N/A 08/15/2013   Procedure: Anterior Cervical Four-Five/Five-Six/Six-Seven Decompression and Fusion;  Surgeon: Tia Alert, MD;  Location: [facility];  Service: Neurosurgery;  Laterality: N/A;   LITHOTRIPSY     WISDOM TOOTH EXTRACTION         Home Medications    Prior to Admission medications   Medication Sig Start Date End Date Taking? Authorizing Provider  benzonatate (TESSALON) 100 MG capsule Take 1 capsule (100 mg total) by mouth 3 (three) times daily as needed for cough. Do not take with alcohol or while driving or operating heavy machinery.  May cause drowsiness. 06/14/22  Yes Valentino Nose, NP  fenofibrate (TRICOR) 48 MG tablet Take 1 tablet (48 mg total) by mouth daily. 12/03/21   Tollie Eth, NP  naratriptan (AMERGE) 2.5 MG tablet Take 1 tablet at onset of migraine; if returns or does not resolve, may repeat after 4 hours; do not exceed 2 tablets in 24 hours. 01/11/22   Tollie Eth, NP  Olopatadine HCl 0.2 % SOLN Apply 2 drops to eye daily as needed. 04/08/22   Tollie Eth, NP  omega-3 acid ethyl esters (LOVAZA) 1 g capsule Take 1 capsule (1 g total) by mouth 2 (two) times daily. 05/09/22   Tollie Eth, NP  ondansetron (ZOFRAN) 8 MG tablet Take 1 tablet (8 mg total) by mouth every 8 (eight) hours as needed for nausea or vomiting. 01/11/22  Tollie Eth, NP  pantoprazole (PROTONIX) 40 MG tablet Take 1 tablet (40 mg total) by mouth daily. 12/03/21   Tollie Eth, NP  propranolol (INDERAL) 40 MG tablet Take 1 tablet (40 mg total) by mouth 2 (two) times daily. 12/03/21   Tollie Eth, NP  tirzepatide (ZEPBOUND) 10 MG/0.5ML Pen Inject 10 mg into the skin once a week. 03/23/22   Tollie Eth, NP  tirzepatide (ZEPBOUND) 5 MG/0.5ML Pen Inject 5 mg into the skin once a week. 06/09/22   Tollie Eth, NP  tirzepatide (ZEPBOUND) 7.5 MG/0.5ML Pen Inject 7.5 mg into the skin once a week. 03/23/22   Tollie Eth, NP  Vitamin D, Ergocalciferol, (DRISDOL) 1.25  MG (50000 UNIT) CAPS capsule Take 1 capsule (50,000 Units total) by mouth every 7 (seven) days. Take for 6 months. We will then plan to recheck your labs 01/15/22   Early, Sung Amabile, NP    Family History Family History  Problem Relation Age of Onset   Headache Mother    Cancer Father        prostate and kidney cancer   Prostate cancer Father    Kidney cancer Father    Kidney Stones Sister    Migraines Sister     Social History Social History   Tobacco Use   Smoking status: Never   Smokeless tobacco: Never  Vaping Use   Vaping Use: Never used  Substance Use Topics   Alcohol use: No    Comment: rarely   Drug use: No     Allergies   Erythromycin and Lisinopril   Review of Systems Review of Systems Per HPI  Physical Exam Triage Vital Signs ED Triage Vitals [06/14/22 1844]  Enc Vitals Group     BP 103/66     Pulse Rate 84     Resp 20     Temp 98.1 F (36.7 C)     Temp Source Oral     SpO2 95 %     Weight      Height      Head Circumference      Peak Flow      Pain Score 5     Pain Loc      Pain Edu?      Excl. in GC?    No data found.  Updated Vital Signs BP 103/66 (BP Location: Right Arm)   Pulse 84   Temp 98.1 F (36.7 C) (Oral)   Resp 20   SpO2 95%   Visual Acuity Right Eye Distance:   Left Eye Distance:   Bilateral Distance:    Right Eye Near:   Left Eye Near:    Bilateral Near:     Physical Exam Vitals and nursing note reviewed.  Constitutional:      General: He is not in acute distress.    Appearance: Normal appearance. He is ill-appearing. He is not toxic-appearing.  HENT:     Head: Normocephalic and atraumatic.     Right Ear: Tympanic membrane, ear canal and external ear normal.     Left Ear: Tympanic membrane, ear canal and external ear normal.     Nose: No congestion or rhinorrhea.     Mouth/Throat:     Mouth: Mucous membranes are moist.     Pharynx: Oropharynx is clear. Posterior oropharyngeal erythema present. No  oropharyngeal exudate.  Eyes:     General: No scleral icterus.    Extraocular Movements: Extraocular movements intact.  Cardiovascular:  Rate and Rhythm: Normal rate and regular rhythm.  Pulmonary:     Effort: Pulmonary effort is normal. No respiratory distress.     Breath sounds: Normal breath sounds. No wheezing, rhonchi or rales.  Abdominal:     General: Abdomen is flat. Bowel sounds are normal. There is no distension.     Palpations: Abdomen is soft.  Musculoskeletal:     Cervical back: Normal range of motion and neck supple.  Lymphadenopathy:     Cervical: No cervical adenopathy.  Skin:    General: Skin is warm and dry.     Coloration: Skin is not jaundiced or pale.     Findings: No erythema or rash.  Neurological:     Mental Status: He is alert and oriented to person, place, and time.  Psychiatric:        Behavior: Behavior is cooperative.      UC Treatments / Results  Labs (all labs ordered are listed, but only abnormal results are displayed) Labs Reviewed  SARS CORONAVIRUS 2 (TAT 6-24 HRS)  POCT INFLUENZA A/B    EKG   Radiology No results found.  Procedures Procedures (including critical care time)  Medications Ordered in UC Medications - No data to display  Initial Impression / Assessment and Plan / UC Course  I have reviewed the triage vital signs and the nursing notes.  Pertinent labs & imaging results that were available during my care of the patient were reviewed by me and considered in my medical decision making (see chart for details).   Patient is well-appearing, normotensive, afebrile, not tachycardic, not tachypneic, oxygenating well on room air.    1. Viral URI with cough 2. Encounter for screening for COVID-19 Suspect viral etiology Vital signs and exam today reassuring COVID-19 test is pending Influenza test today is negative Supportive care discussed with patient Start cough suppressant ER return precautions discussed Note given  for work  The patient was given the opportunity to ask questions.  All questions answered to their satisfaction.  The patient is in agreement to this plan.    Final Clinical Impressions(s) / UC Diagnoses   Final diagnoses:  Viral URI with cough  Encounter for screening for COVID-19     Discharge Instructions      You have a viral upper respiratory infection.  Symptoms should improve over the next week to 10 days.  If you develop chest pain or shortness of breath, go to the emergency room.  We have tested you today for COVID-19.  You will see the results in Mychart and we will call you with positive results.    Please stay home and isolate until you are aware of the results.    Some things that can make you feel better are: - Increased rest - Increasing fluid with water/sugar free electrolytes - Acetaminophen and ibuprofen as needed for fever/pain - Salt water gargling, chloraseptic spray and throat lozenges for sore throat - OTC guaifenesin (Mucinex) 600 mg twice daily for congestion - Saline sinus flushes or a neti pot - Humidifying the air -Tessalon Perles as needed for dry cough     ED Prescriptions     Medication Sig Dispense Auth. Provider   benzonatate (TESSALON) 100 MG capsule Take 1 capsule (100 mg total) by mouth 3 (three) times daily as needed for cough. Do not take with alcohol or while driving or operating heavy machinery.  May cause drowsiness. 21 capsule Valentino Nose, NP      PDMP not  reviewed this encounter.   Valentino Nose, NP 06/14/22 1924

## 2022-06-14 NOTE — ED Triage Notes (Signed)
Pt reports he has been having a sore throat, chest tightness, dizziness,  cough on and off, headache, vomiting and fatigue x 2 days. Took naratriptan and alkezleter but no relief.

## 2022-06-15 ENCOUNTER — Other Ambulatory Visit (HOSPITAL_COMMUNITY): Payer: Self-pay

## 2022-06-15 LAB — SARS CORONAVIRUS 2 (TAT 6-24 HRS): SARS Coronavirus 2: NEGATIVE

## 2022-06-16 ENCOUNTER — Telehealth: Payer: 59 | Admitting: Nurse Practitioner

## 2022-06-20 ENCOUNTER — Other Ambulatory Visit (HOSPITAL_COMMUNITY): Payer: Self-pay

## 2022-06-20 ENCOUNTER — Encounter: Payer: Self-pay | Admitting: Nurse Practitioner

## 2022-07-05 DIAGNOSIS — G4733 Obstructive sleep apnea (adult) (pediatric): Secondary | ICD-10-CM | POA: Diagnosis not present

## 2022-07-18 ENCOUNTER — Other Ambulatory Visit (HOSPITAL_COMMUNITY): Payer: Self-pay

## 2022-07-19 ENCOUNTER — Encounter: Payer: Self-pay | Admitting: Pharmacist

## 2022-07-19 ENCOUNTER — Other Ambulatory Visit: Payer: Self-pay

## 2022-07-25 ENCOUNTER — Other Ambulatory Visit: Payer: Self-pay

## 2022-07-26 ENCOUNTER — Other Ambulatory Visit (HOSPITAL_COMMUNITY): Payer: Self-pay

## 2022-07-27 ENCOUNTER — Other Ambulatory Visit (HOSPITAL_COMMUNITY): Payer: Self-pay

## 2022-08-04 ENCOUNTER — Encounter: Payer: Self-pay | Admitting: Nurse Practitioner

## 2022-08-04 DIAGNOSIS — G4733 Obstructive sleep apnea (adult) (pediatric): Secondary | ICD-10-CM | POA: Diagnosis not present

## 2022-08-07 ENCOUNTER — Other Ambulatory Visit: Payer: Self-pay | Admitting: Nurse Practitioner

## 2022-08-07 DIAGNOSIS — E559 Vitamin D deficiency, unspecified: Secondary | ICD-10-CM

## 2022-08-08 ENCOUNTER — Other Ambulatory Visit: Payer: Self-pay

## 2022-08-08 ENCOUNTER — Other Ambulatory Visit (HOSPITAL_COMMUNITY): Payer: Self-pay

## 2022-08-08 ENCOUNTER — Ambulatory Visit: Payer: 59 | Admitting: Medical

## 2022-08-08 VITALS — BP 104/70 | HR 44 | Temp 97.7°F | Wt 189.0 lb

## 2022-08-08 DIAGNOSIS — R1013 Epigastric pain: Secondary | ICD-10-CM

## 2022-08-08 DIAGNOSIS — R350 Frequency of micturition: Secondary | ICD-10-CM

## 2022-08-08 DIAGNOSIS — K8020 Calculus of gallbladder without cholecystitis without obstruction: Secondary | ICD-10-CM | POA: Diagnosis not present

## 2022-08-08 DIAGNOSIS — K219 Gastro-esophageal reflux disease without esophagitis: Secondary | ICD-10-CM | POA: Diagnosis not present

## 2022-08-08 LAB — POCT URINALYSIS DIP (PROADVANTAGE DEVICE)
Bilirubin, UA: NEGATIVE
Blood, UA: NEGATIVE
Glucose, UA: NEGATIVE mg/dL
Ketones, POC UA: NEGATIVE mg/dL
Leukocytes, UA: NEGATIVE
Nitrite, UA: NEGATIVE
Protein Ur, POC: NEGATIVE mg/dL
Specific Gravity, Urine: 1.02
Urobilinogen, Ur: NEGATIVE
pH, UA: 6 (ref 5.0–8.0)

## 2022-08-08 MED ORDER — FAMOTIDINE 20 MG PO TABS
20.0000 mg | ORAL_TABLET | Freq: Two times a day (BID) | ORAL | 1 refills | Status: DC
  Filled 2022-08-08: qty 60, 30d supply, fill #0

## 2022-08-08 MED ORDER — VITAMIN D (ERGOCALCIFEROL) 1.25 MG (50000 UNIT) PO CAPS
50000.0000 [IU] | ORAL_CAPSULE | ORAL | 0 refills | Status: DC
Start: 2022-08-08 — End: 2023-05-12
  Filled 2022-08-08: qty 12, 84d supply, fill #0

## 2022-08-08 NOTE — Telephone Encounter (Signed)
Pt. Is seeing you later today at 3:30 may want to recheck his Vit D level.

## 2022-08-08 NOTE — Progress Notes (Signed)
Subjective: Chief Complaint  Patient presents with   Urinary Frequency    Urinary frequency x a couple months and having some stomach pain x a couple months and now very tender and has acid reflux   Here for concerns about abdominal pain and urinary issues.  He has a history of acid reflux and hiatal hernia.  He does take Protonix 40 mg every day first in the morning on empty stomach.  He uses Tums occasionally.  But lately it feels like acid reflux is just prolonged or continuing.  He was curious about building up on Protonix twice a day or doing something different.  He does see gastroenterology, Dr. Loreta Ave.  He had a colonoscopy a few years ago but no upper scope.  No severe pain.  Pain is mainly epigastric.  He also has history of gallstones and did note this is a gallbladder issue.  Denies specifically pain after eating or right upper quadrant pain that is severe after eating.  No vomiting.  No shoulder pain.  He has some concerns about urinary frequency.  For several months he urinates quite often.  He has get 1-2 times per night to urinate.  No odor, no blood in urine, no concern for STD.  Monogamous, married for 25 years.  He does have some urinary dribbling at times.  Alcohol is limited to 1 or 2 times per week  He does drink a lot of water.  Review of systems as in subjective  Past Medical History:  Diagnosis Date   Allergy    Anxiety    Cervicalgia 04/17/2012   Chronic neck pain    Chronic pain    DDD (degenerative disc disease)    Herniated cervical disc    Hypertension    IBS (irritable bowel syndrome)    Lumbar pain with radiation down left leg 01/30/2014   Migraines    since 2002   Migraines    Nausea 10/30/2020   Renal calculi    Shingles    Sleep apnea    does not wear c-pap   Stressful life events affecting family and household 05/30/2019   Xiphoid pain 05/30/2019   Current Outpatient Medications on File Prior to Visit  Medication Sig Dispense Refill    fenofibrate (TRICOR) 48 MG tablet Take 1 tablet (48 mg total) by mouth daily. 90 tablet 3   naratriptan (AMERGE) 2.5 MG tablet Take 1 tablet at onset of migraine; if returns or does not resolve, may repeat after 4 hours; do not exceed 2 tablets in 24 hours. 20 tablet 6   Olopatadine HCl 0.2 % SOLN Apply 2 drops to eye daily as needed. 2.5 mL 6   omega-3 acid ethyl esters (LOVAZA) 1 g capsule Take 1 capsule (1 g total) by mouth 2 (two) times daily. 60 capsule 5   ondansetron (ZOFRAN) 8 MG tablet Take 1 tablet (8 mg total) by mouth every 8 (eight) hours as needed for nausea or vomiting. 20 tablet 2   pantoprazole (PROTONIX) 40 MG tablet Take 1 tablet (40 mg total) by mouth daily. 90 tablet 3   propranolol (INDERAL) 40 MG tablet Take 1 tablet (40 mg total) by mouth 2 (two) times daily. 180 tablet 3   Vitamin D, Ergocalciferol, (DRISDOL) 1.25 MG (50000 UNIT) CAPS capsule Take 1 capsule (50,000 Units total) by mouth every 7 (seven) days. Take for 6 months. We will then plan to recheck your labs 12 capsule 0   No current facility-administered medications on file prior  to visit.   Objective: BP 104/70   Pulse (!) 44   Temp 97.7 F (36.5 C)   Wt 189 lb (85.7 kg)   BMI 27.12 kg/m   General: Well-developed well-nourished no acute distress Abdomen: Positive bowel sounds, soft, mild epigastric tenderness otherwise nontender, no organomegaly, no mass GU: Deferred/declined  Right upper quadrant ultrasound 08/10/2021 IMPRESSION: 1. Cholelithiasis without acute cholecystitis. 2. Hepatic steatosis without focal liver lesions.   CT abdomen pelvis 08/09/2021 IMPRESSION: 1. Cholelithiasis without evidence of acute cholecystitis. 2. Hepatic steatosis.   Aortic Atherosclerosis (ICD10-I70.0).  Assessment: Encounter Diagnoses  Name Primary?   Urine frequency Yes   Epigastric abdominal pain    Calculus of gallbladder without cholecystitis without obstruction    Gastroesophageal reflux disease,  unspecified whether esophagitis present     Plan: Urinary frequency-advise he cut out energy drinks, limit caffeine, avoid beverages within 2 hours of bedtime.  He is going to work on lifestyle changes first.  Urinalysis suggest no infection today.  No concern for STD.  If not improving in next 2 weeks add trial of Flomax or referral to urology or both  Abdominal pain- Current symptoms likely more due to acid reflux and gastritis.  He does have a history of gallstones and I reviewed his imaging from July 2023 in the chart record above.  Avoid GERD triggers, avoid large portions and greasy fatty foods.  Continue Protonix 40 mg in the morning, and famotidine Pepcid for the next 2 weeks at nighttime and can add Pepto-Bismol twice a day for the next 1 to 2 weeks as well.  If not improving in the next 2 weeks then needs to go back and see Dr. Loreta Ave, GI  No sign of gallbladder issue currently.  [name] was seen today for urinary frequency.  Diagnoses and all orders for this visit:  Urine frequency -     POCT Urinalysis DIP (Proadvantage Device)  Epigastric abdominal pain  Calculus of gallbladder without cholecystitis without obstruction  Gastroesophageal reflux disease, unspecified whether esophagitis present  Other orders -     famotidine (PEPCID) 20 MG tablet; Take 1 tablet (20 mg total) by mouth 2 (two) times daily.   F/u 2-3 weeks

## 2022-08-09 ENCOUNTER — Other Ambulatory Visit: Payer: Self-pay

## 2022-08-30 ENCOUNTER — Other Ambulatory Visit: Payer: Self-pay

## 2022-08-30 ENCOUNTER — Telehealth: Payer: 59 | Admitting: Physician Assistant

## 2022-08-30 DIAGNOSIS — J069 Acute upper respiratory infection, unspecified: Secondary | ICD-10-CM

## 2022-08-31 MED ORDER — FLUTICASONE PROPIONATE 50 MCG/ACT NA SUSP
2.0000 | Freq: Every day | NASAL | 0 refills | Status: AC
Start: 2022-08-31 — End: ?

## 2022-08-31 MED ORDER — PROMETHAZINE-DM 6.25-15 MG/5ML PO SYRP
5.0000 mL | ORAL_SOLUTION | Freq: Four times a day (QID) | ORAL | 0 refills | Status: DC | PRN
Start: 2022-08-31 — End: 2022-09-29

## 2022-08-31 MED ORDER — NAPROXEN 500 MG PO TABS: 500.0000 mg | ORAL_TABLET | Freq: Two times a day (BID) | ORAL | 0 refills | Status: DC

## 2022-08-31 NOTE — Progress Notes (Signed)
E-Visit for [employer] Virus / COVID Screening  Your current symptoms could be consistent with COVID.  Please complete a Covid test either at home or check with your local pharmacy to see if they provide testing.    You have tested positive for COVID-19, meaning that you were infected with the novel coronavirus and could give the virus to others.  Most people with COVID-19 have mild illness and can recover at home without medical care. Do not leave your home, except to get medical care. Do not visit public areas and do not go to places where you are unable to wear a mask. It is important that you stay home  to take care for yourself and to help protect other people in your home and community.      Isolation Instructions:   You are to isolate at home until you have been fever free for at least 24 hours without a fever-reducing medication, and symptoms have been steadily improving for 24 hours. At that time,  you can end isolation but need to mask for an additional 5 days.  If you must be around other household members who do not have symptoms, you need to make sure that both you and the family members are masking consistently with a high-quality mask.  If you note any worsening of symptoms despite treatment, please seek an in-person evaluation ASAP. If you note any significant shortness of breath or any chest pain, please seek ER evaluation. Please do not delay care!  Go to the nearest hospital ED for assessment if fever/cough/breathlessness are severe or illness seems like a threat to life.    The following symptoms may appear 2-14 days after exposure: Fever Cough Shortness of breath or difficulty breathing Chills Repeated shaking with chills Muscle pain Headache Sore throat New loss of taste or smell Fatigue Congestion or runny nose Nausea or vomiting Diarrhea  You can use medication such as prescription cough medication called Phenergan DM 6.25 mg/15 mg. You make take one teaspoon / 5 ml  every 4-6 hours as needed for cough, prescription anti-inflammatory called Naprosyn 500 mg. Take twice daily as needed for fever or body aches for 2 weeks, and prescription for Fluticasone nasal spray 2 sprays in each nostril one time per day  You may also take acetaminophen (Tylenol) as needed for fever.  HOME CARE Only take medications as instructed by your medical team. Drink plenty of fluids and get plenty of rest. A steam or ultrasonic humidifier can help if you have congestion.  GET HELP RIGHT AWAY IF YOU HAVE EMERGENCY WARNING SIGNS.  Call 911 or proceed to your closest emergency facility if: You develop worsening high fever. Trouble breathing Bluish lips or face Persistent pain or pressure in the chest New confusion Inability to wake or stay awake You cough up blood. Your symptoms become more severe Inability to hold down food or fluids  This list is not all possible symptoms. Contact your medical provider for any symptoms that are severe or concerning to you.   Your e-visit answers were reviewed by a board certified advanced clinical practitioner to complete your personal care plan.  Depending on the condition, your plan could have included both over the counter or prescription medications.  If there is a problem, please reply once you have received a response from your provider.  Your safety is important to [place].  If you have drug allergies check your prescription carefully.    You can use MyChart to ask questions about  today's visit, request a non-urgent call back, or ask for a work or school excuse for 24 hours related to this e-Visit. If it has been greater than 24 hours you will need to follow up with your provider or enter a new e-Visit to address those concerns. You will get an e-mail in the next two days asking about your experience.  I hope that your e-visit has been valuable and will speed your recovery. Thank you for using e-visits.    I have spent 5 minutes in  review of e-visit questionnaire, review and updating patient chart, medical decision making and response to patient.   Margaretann Loveless, PA-C

## 2022-09-01 ENCOUNTER — Other Ambulatory Visit: Payer: Self-pay | Admitting: Medical

## 2022-09-01 ENCOUNTER — Other Ambulatory Visit (HOSPITAL_COMMUNITY): Payer: Self-pay

## 2022-09-01 ENCOUNTER — Other Ambulatory Visit: Payer: Self-pay

## 2022-09-01 MED ORDER — FAMOTIDINE 20 MG PO TABS
20.0000 mg | ORAL_TABLET | Freq: Two times a day (BID) | ORAL | 2 refills | Status: DC
  Filled 2022-09-01: qty 30, 15d supply, fill #0
  Filled 2022-09-20: qty 30, 15d supply, fill #1
  Filled 2022-10-03: qty 30, 15d supply, fill #2

## 2022-09-04 ENCOUNTER — Encounter: Payer: Self-pay | Admitting: Nurse Practitioner

## 2022-09-04 DIAGNOSIS — G4733 Obstructive sleep apnea (adult) (pediatric): Secondary | ICD-10-CM | POA: Diagnosis not present

## 2022-09-06 ENCOUNTER — Telehealth (INDEPENDENT_AMBULATORY_CARE_PROVIDER_SITE_OTHER): Payer: 59 | Admitting: Nurse Practitioner

## 2022-09-06 ENCOUNTER — Encounter: Payer: Self-pay | Admitting: Nurse Practitioner

## 2022-09-06 VITALS — Wt 193.0 lb

## 2022-09-06 DIAGNOSIS — R052 Subacute cough: Secondary | ICD-10-CM | POA: Diagnosis not present

## 2022-09-06 HISTORY — DX: Subacute cough: R05.2

## 2022-09-06 MED ORDER — PREDNISONE 10 MG PO TABS: 10.0000 mg | ORAL_TABLET | Freq: Every day | ORAL | 0 refills | Status: DC

## 2022-09-06 MED ORDER — AZITHROMYCIN 250 MG PO TABS
ORAL_TABLET | ORAL | 0 refills | Status: AC
Start: 2022-09-06 — End: 2022-09-11

## 2022-09-06 MED ORDER — BUDESONIDE 0.25 MG/2ML IN SUSP
0.2500 mg | Freq: Two times a day (BID) | RESPIRATORY_TRACT | 0 refills | Status: DC | PRN
Start: 2022-09-06 — End: 2023-02-09

## 2022-09-06 NOTE — Patient Instructions (Addendum)
I have sent in a steroid nebulizer treatment for you. You can do this up to two times a day to help keep the inflammation directly in the airways down. You can continue to use the albuterol. Let me know if you need more of this.   I have also sent in azithromycin in case there is a bacterial component. I do think that something aggravated your airways and they just can calm down from this. Sometimes infections can come in with this as opportunistic infections.   I did send a steroid burst. You can hold off on starting this, if you would like and see how you feel with the other treatment. But it is ok to go ahead and start this. This will also help with the chest tenderness.   I do want you to start the mucinex and be sure you are drinking plenty of water to help this work best.   I believe your rib cage is inflamed from the coughing. I put more information on this below. This can take the longest to completely go away, but if it is not showing improvement by next week, please let me know and we wlll get an x-ray.   Costochondritis  Costochondritis is irritation and swelling (inflammation) of the tissue that connects the ribs to the breastbone (sternum). This tissue is called cartilage. This condition causes pain in the front of the chest. The pain often starts slowly. It may be in more than one rib. What are the causes? The cause of this condition is not always known. It can come from stress on the sternum. The cause of this stress could be: Chest injury. Exercise or activity. This may include lifting. Very bad coughing. What increases the risk? Being male. Being 49-72 years old. Starting a new exercise or work activity. Having low levels of vitamin D. Having a condition that makes you cough a lot. What are the signs or symptoms? Chest pain that: Starts slowly. It can be sharp or dull. Gets worse with deep breathing, coughing, or exercise. Gets better with rest. May be worse when  you press on your ribs and breastbone. How is this treated? In most cases, this condition goes away on its own over time. You may need to take an NSAID, such as ibuprofen. This can help reduce pain. You may also need to: Rest and stay away from activities that make pain worse. Put heat or ice on the area that hurts. Do exercises to stretch your chest muscles. If these treatments do not help, your doctor may inject a medicine to numb the area. This can help relieve the pain. Follow these instructions at home: Managing pain, stiffness, and swelling     If told, put ice on the painful area. To do this: Put ice in a plastic bag. Place a towel between your skin and the bag. Leave the ice on for 20 minutes, 2-3 times a day. If told, put heat on the affected area. Do this as often as told by your doctor. Use the heat source that your doctor recommends, such as a moist heat pack or a heating pad. Place a towel between your skin and the heat source. Leave the heat on for 20-30 minutes. If your skin turns bright red, take off the ice or heat right away to prevent skin damage. The risk of skin damage is higher if you cannot feel pain, heat, or cold. Activity Rest as told by your doctor. Do not do things that make  your pain worse. This includes activities that use your chest, belly (abdomen), and side muscles. You may have to avoid lifting. Ask your doctor how much you can safely lift. Return to your normal activities when your doctor says that it is safe. General instructions Take over-the-counter and prescription medicines only as told by your doctor. Contact a doctor if: You have chills or a fever. Your pain does not go away or gets worse. You have a cough that does not go away. Get help right away if: You have a hard time breathing. You have very bad chest pain that does not get better with medicines, heat, or ice. These symptoms may be an emergency. Get help right away. Call 911. Do not  wait to see if the symptoms will go away. Do not drive yourself to the hospital. This information is not intended to replace advice given to you by your health care provider. Make sure you discuss any questions you have with your health care provider. Document Revised: 07/22/2021 Document Reviewed: 07/22/2021 Elsevier Patient Education  2024 [employer].

## 2022-09-06 NOTE — Progress Notes (Signed)
Virtual Visit Encounter mychart visit.   I connected with  [name] on 09/06/22 at 10:30 AM EDT by secure video and audio telemedicine application. I verified that I am speaking with the correct person using two identifiers.   I introduced myself as a Publishing rights manager with the practice. The limitations of evaluation and management by telemedicine discussed with the patient and the availability of in person appointments. The patient expressed verbal understanding and consent to proceed.  Participating parties in this visit include: Myself and patient  The patient is: Patient Location: Home I am: Provider Location: Office/Clinic Subjective:    CC and HPI: [name] is a 47 y.o. year old male presenting for new evaluation and treatment of cough and fatigue. Patient reports the following: Cough [name] reports a deep, noisy, productive cough that has been ongoing for about a week. He reports it started shortly after working in the yard. He tells me he may have inhaled something, but he isn't sure. He is having chest wall pain on the left side below the pectoral muscle. He reports when coughing the pain increases. He has been taking several different OTC medications to help, but nothing seems to be effective. He has used albuterol nebulizer and this has helped, but short term. He has not had any fevers, but has had bouts of chills and sweats. He also reports increased fatigue. He tells me that his mucous production is yellow in color. He had 2 negative covid tests.  Past medical history, Surgical history, Family history not pertinant except as noted below, Social history, Allergies, and medications have been entered into the medical record, reviewed, and corrections made.   Review of Systems:  All review of systems negative except what is listed in the HPI  Objective:    Alert and oriented x 4 Cough present.  Speaking in clear sentences with no shortness of breath. No  distress.  Impression and Recommendations:    Problem List Items Addressed This Visit     Subacute cough - Primary    Symptoms of productive cough with increased fatigue x 1 week. Negative for COVID. I suspect viral or possible allergic etiology initially that has progressed with possible bacterial component given the worsening of symptoms. His chest tenderness appears to be costochondritis related to ongoing coughing.  Plan: - Take mucinex twice a day and increase water intake - Budesonide nebulizer twice a day as needed for cough, ok to continue to use albuteral as needed up to every 4 hours.  - Azithromycin for possible infection. Patient reports he has safely taken this in the past.  - Prednisone burst for inflammation and chest wall pain.  - If chest wall pain show no improvement with symptom improvement, we will get chest x-ray.       Relevant Medications   budesonide (PULMICORT) 0.25 MG/2ML nebulizer solution   predniSONE (DELTASONE) 10 MG tablet   azithromycin (ZITHROMAX) 250 MG tablet    orders and follow up as documented in EMR I discussed the assessment and treatment plan with the patient. The patient was provided an opportunity to ask questions and all were answered. The patient agreed with the plan and demonstrated an understanding of the instructions.   The patient was advised to call back or seek an in-person evaluation if the symptoms worsen or if the condition fails to improve as anticipated.  Follow-Up: prn  I provided 28 minutes of non-face-to-face interaction with this non face-to-face encounter including intake, same-day documentation, and  chart review.   Tollie Eth, NP , DNP, AGNP-c [facility]

## 2022-09-06 NOTE — Assessment & Plan Note (Signed)
Symptoms of productive cough with increased fatigue x 1 week. Negative for COVID. I suspect viral or possible allergic etiology initially that has progressed with possible bacterial component given the worsening of symptoms. His chest tenderness appears to be costochondritis related to ongoing coughing.  Plan: - Take mucinex twice a day and increase water intake - Budesonide nebulizer twice a day as needed for cough, ok to continue to use albuteral as needed up to every 4 hours.  - Azithromycin for possible infection. Patient reports he has safely taken this in the past.  - Prednisone burst for inflammation and chest wall pain.  - If chest wall pain show no improvement with symptom improvement, we will get chest x-ray.

## 2022-09-11 ENCOUNTER — Encounter: Payer: Self-pay | Admitting: Nurse Practitioner

## 2022-09-14 ENCOUNTER — Other Ambulatory Visit: Payer: Self-pay

## 2022-09-14 ENCOUNTER — Other Ambulatory Visit (HOSPITAL_BASED_OUTPATIENT_CLINIC_OR_DEPARTMENT_OTHER): Payer: Self-pay

## 2022-09-14 ENCOUNTER — Encounter: Payer: Self-pay | Admitting: Nurse Practitioner

## 2022-09-14 ENCOUNTER — Other Ambulatory Visit: Payer: Self-pay | Admitting: Nurse Practitioner

## 2022-09-14 ENCOUNTER — Telehealth: Payer: Self-pay

## 2022-09-14 ENCOUNTER — Ambulatory Visit (HOSPITAL_BASED_OUTPATIENT_CLINIC_OR_DEPARTMENT_OTHER)
Admission: RE | Admit: 2022-09-14 | Discharge: 2022-09-14 | Disposition: A | Discharge status: Home / Self Care | Payer: 59 | Source: Ambulatory Visit | Attending: Nurse Practitioner | Admitting: Nurse Practitioner

## 2022-09-14 DIAGNOSIS — R071 Chest pain on breathing: Secondary | ICD-10-CM | POA: Diagnosis not present

## 2022-09-14 DIAGNOSIS — J069 Acute upper respiratory infection, unspecified: Secondary | ICD-10-CM | POA: Diagnosis not present

## 2022-09-14 DIAGNOSIS — R052 Subacute cough: Secondary | ICD-10-CM

## 2022-09-14 DIAGNOSIS — R059 Cough, unspecified: Secondary | ICD-10-CM | POA: Diagnosis not present

## 2022-09-14 MED ORDER — METHOCARBAMOL 750 MG PO TABS: 750.0000 mg | ORAL_TABLET | Freq: Three times a day (TID) | ORAL | 0 refills | Status: DC

## 2022-09-14 MED ORDER — METHOCARBAMOL 750 MG PO TABS
750.0000 mg | ORAL_TABLET | Freq: Three times a day (TID) | ORAL | 0 refills | Status: DC
  Filled 2022-09-14 (×2): qty 15, 5d supply, fill #0

## 2022-09-14 NOTE — Telephone Encounter (Signed)
Pt called requesting rx be sent to the [facility] [address]. Rx sent.

## 2022-09-15 ENCOUNTER — Other Ambulatory Visit: Payer: Self-pay

## 2022-09-15 DIAGNOSIS — J069 Acute upper respiratory infection, unspecified: Secondary | ICD-10-CM

## 2022-09-16 ENCOUNTER — Other Ambulatory Visit (HOSPITAL_COMMUNITY): Payer: Self-pay

## 2022-09-16 MED ORDER — LEVOCETIRIZINE DIHYDROCHLORIDE 5 MG PO TABS
5.0000 mg | ORAL_TABLET | Freq: Every evening | ORAL | 5 refills | Status: DC
  Filled 2022-09-16: qty 30, 30d supply, fill #0
  Filled 2022-10-20: qty 30, 30d supply, fill #1
  Filled 2022-11-17: qty 30, 30d supply, fill #2

## 2022-09-17 ENCOUNTER — Other Ambulatory Visit (HOSPITAL_COMMUNITY): Payer: Self-pay

## 2022-09-20 ENCOUNTER — Other Ambulatory Visit (HOSPITAL_COMMUNITY): Payer: Self-pay

## 2022-09-21 ENCOUNTER — Other Ambulatory Visit: Payer: Self-pay

## 2022-09-26 ENCOUNTER — Encounter: Payer: Self-pay | Admitting: Nurse Practitioner

## 2022-09-29 ENCOUNTER — Other Ambulatory Visit (HOSPITAL_COMMUNITY): Payer: Self-pay

## 2022-09-29 ENCOUNTER — Other Ambulatory Visit: Payer: Self-pay

## 2022-09-29 ENCOUNTER — Other Ambulatory Visit: Payer: Self-pay | Admitting: Nurse Practitioner

## 2022-09-29 DIAGNOSIS — R052 Subacute cough: Secondary | ICD-10-CM

## 2022-09-29 MED ORDER — AMOXICILLIN-POT CLAVULANATE 875-125 MG PO TABS
1.0000 | ORAL_TABLET | Freq: Two times a day (BID) | ORAL | 0 refills | Status: DC
Start: 2022-09-29 — End: 2022-12-21
  Filled 2022-09-29: qty 20, 10d supply, fill #0

## 2022-10-03 ENCOUNTER — Other Ambulatory Visit (HOSPITAL_COMMUNITY): Payer: Self-pay

## 2022-10-07 ENCOUNTER — Other Ambulatory Visit (HOSPITAL_COMMUNITY): Payer: Self-pay

## 2022-10-13 NOTE — Telephone Encounter (Signed)
At this point, I don't know of anything else that we can do from my standpoint. I would ask to see if he can be placed on the pulmonology cancellation list to possibly get in sooner. This is a very unusual situation. I would keep taking the allergy medication in the event post nasal drip could be contributing.

## 2022-10-14 ENCOUNTER — Other Ambulatory Visit (HOSPITAL_COMMUNITY): Payer: Self-pay

## 2022-10-19 DIAGNOSIS — G4733 Obstructive sleep apnea (adult) (pediatric): Secondary | ICD-10-CM | POA: Diagnosis not present

## 2022-10-20 ENCOUNTER — Encounter: Payer: Self-pay | Admitting: Nurse Practitioner

## 2022-10-20 ENCOUNTER — Other Ambulatory Visit: Payer: Self-pay

## 2022-10-20 ENCOUNTER — Other Ambulatory Visit (HOSPITAL_COMMUNITY): Payer: Self-pay

## 2022-10-20 MED ORDER — FAMOTIDINE 20 MG PO TABS
20.0000 mg | ORAL_TABLET | Freq: Two times a day (BID) | ORAL | 2 refills | Status: DC
  Filled 2022-10-20: qty 30, 15d supply, fill #0
  Filled 2022-11-17: qty 30, 15d supply, fill #1

## 2022-11-01 ENCOUNTER — Other Ambulatory Visit (HOSPITAL_COMMUNITY): Payer: Self-pay

## 2022-11-18 ENCOUNTER — Other Ambulatory Visit: Payer: Self-pay

## 2022-11-24 ENCOUNTER — Institutional Professional Consult (permissible substitution): Payer: 59 | Admitting: Pulmonary Disease

## 2022-11-26 ENCOUNTER — Ambulatory Visit
Admission: EM | Admit: 2022-11-26 | Discharge: 2022-11-26 | Disposition: A | Discharge status: Home / Self Care | Payer: 59 | Attending: Internal Medicine | Admitting: Internal Medicine

## 2022-11-26 ENCOUNTER — Telehealth: Payer: 59 | Admitting: Physician Assistant

## 2022-11-26 DIAGNOSIS — J4530 Mild persistent asthma, uncomplicated: Secondary | ICD-10-CM

## 2022-11-26 DIAGNOSIS — N390 Urinary tract infection, site not specified: Secondary | ICD-10-CM

## 2022-11-26 LAB — POCT URINALYSIS DIP (MANUAL ENTRY)
Bilirubin, UA: NEGATIVE
Blood, UA: NEGATIVE
Glucose, UA: NEGATIVE mg/dL
Ketones, POC UA: NEGATIVE mg/dL
Leukocytes, UA: NEGATIVE
Nitrite, UA: NEGATIVE
Protein Ur, POC: NEGATIVE mg/dL
Spec Grav, UA: 1.015 (ref 1.010–1.025)
Urobilinogen, UA: 0.2 U/dL
pH, UA: 7.5 (ref 5.0–8.0)

## 2022-11-26 MED ORDER — PROMETHAZINE-DM 6.25-15 MG/5ML PO SYRP: 5.0000 mL | ORAL_SOLUTION | Freq: Three times a day (TID) | ORAL | 0 refills | Status: DC | PRN

## 2022-11-26 MED ORDER — IPRATROPIUM-ALBUTEROL 0.5-2.5 (3) MG/3ML IN SOLN: 3.0000 mL | Freq: Four times a day (QID) | RESPIRATORY_TRACT | 0 refills | Status: DC | PRN

## 2022-11-26 MED ORDER — PREDNISONE 20 MG PO TABS: ORAL_TABLET | ORAL | 0 refills | Status: DC

## 2022-11-26 NOTE — Progress Notes (Signed)
Because you have ongoing symptoms, had previous episodes, and were referred to a Pulmonologist, I feel your condition warrants further evaluation and I recommend that you be seen in a face to face visit.   NOTE: There will be NO CHARGE for this eVisit   If you are having a true medical emergency please call 911.      For an urgent face to face visit, [place] has eight urgent care centers for your convenience:   NEW!! [facility] Center at [facility] Get Driving Directions [phone] [address] [place], [address]    [facility] Health Urgent Care Center at [facility] Get Driving Directions [phone] [address] [place], [place] [address]   [facility] Health Urgent Care Center [facility]) Get Driving Directions [phone] [address] [place], [place] [address]  [facility] Health Urgent Care Center [facility]) Get Driving Directions [phone] [address] [place],  [place]  [address]  [facility] Health Urgent Care Center [facility] - at [employer]  [phone] [id number] [place],  [place] [address]   [facility] Health Urgent Care at [facility] Get Driving Directions [phone] [address] [place], [place] [address]   [facility] Health Urgent Care at [facility] Get Driving Directions  [phone] [address] [place], [place] [address]   [facility] Health Urgent Care at [facility] Directions [phone] [address] [place], [place] [address]  Your MyChart E-visit questionnaire answers were reviewed by a board certified advanced clinical practitioner to complete your personal care plan based on your specific symptoms.  Thank you for using e-Visits.

## 2022-11-26 NOTE — Progress Notes (Signed)
Note sent to patient via mychart for clarification regarding chest pain.

## 2022-11-26 NOTE — Progress Notes (Signed)
I have spent 5 minutes in review of e-visit questionnaire, review and updating patient chart, medical decision making and response to patient.   Laure Kidney, PA-C

## 2022-11-26 NOTE — ED Provider Notes (Signed)
[facility] - URGENT CARE CENTER  Note:  This document was prepared using Conservation officer, historic buildings and may include unintentional dictation errors.  MRN: [id number] DOB: 08-16-1975  Subjective:   [name] is a 47 y.o. male presenting for 3 day history of persistent coughing. Had an episode of the same about few months ago. Previously had to get multiple treatments over the summer.   No current facility-administered medications for this encounter.  Current Outpatient Medications:    amoxicillin-clavulanate (AUGMENTIN) 875-125 MG tablet, Take 1 tablet by mouth 2 (two) times daily., Disp: 20 tablet, Rfl: 0   budesonide (PULMICORT) 0.25 MG/2ML nebulizer solution, Take 2 mLs (0.25 mg total) by nebulization 2 (two) times daily as needed (cough, wheezing)., Disp: 30 mL, Rfl: 0   famotidine (PEPCID) 20 MG tablet, Take 1 tablet (20 mg) by mouth 2 times daily., Disp: 30 tablet, Rfl: 2   fenofibrate (TRICOR) 48 MG tablet, Take 1 tablet (48 mg total) by mouth daily., Disp: 90 tablet, Rfl: 3   fluticasone (FLONASE) 50 MCG/ACT nasal spray, Place 2 sprays into both nostrils daily., Disp: 16 g, Rfl: 0   levocetirizine (XYZAL) 5 MG tablet, Take 1 tablet (5 mg total) by mouth every evening., Disp: 30 tablet, Rfl: 5   methocarbamol (ROBAXIN-750) 750 MG tablet, Take 1 tablet (750 mg total) by mouth 3 (three) times daily., Disp: 15 tablet, Rfl: 0   naratriptan (AMERGE) 2.5 MG tablet, Take 1 tablet at onset of migraine; if returns or does not resolve, may repeat after 4 hours; do not exceed 2 tablets in 24 hours., Disp: 20 tablet, Rfl: 6   Olopatadine HCl 0.2 % SOLN, Apply 2 drops to eye daily as needed., Disp: 2.5 mL, Rfl: 6   omega-3 acid ethyl esters (LOVAZA) 1 g capsule, Take 1 capsule (1 g total) by mouth 2 (two) times daily., Disp: 60 capsule, Rfl: 5   ondansetron (ZOFRAN) 8 MG tablet, Take 1 tablet (8 mg total) by mouth every 8 (eight) hours as needed for nausea or vomiting. (Patient not  taking: Reported on 09/06/2022), Disp: 20 tablet, Rfl: 2   pantoprazole (PROTONIX) 40 MG tablet, Take 1 tablet (40 mg total) by mouth daily., Disp: 90 tablet, Rfl: 3   propranolol (INDERAL) 40 MG tablet, Take 1 tablet (40 mg total) by mouth 2 (two) times daily., Disp: 180 tablet, Rfl: 3   Vitamin D, Ergocalciferol, (DRISDOL) 1.25 MG (50000 UNIT) CAPS capsule, Take 1 capsule by mouth every 7 days. Take for 6 months. We will then plan to recheck your labs, Disp: 12 capsule, Rfl: 0   Allergies  Allergen Reactions   Erythromycin Nausea And Vomiting   Lisinopril     Headaches, concern about kidney stones. No allergy to the medication.   Other     PECANS, WALNUTS    Past Medical History:  Diagnosis Date   Allergy    Anxiety    Cervicalgia 04/17/2012   Chronic neck pain    Chronic pain    DDD (degenerative disc disease)    Herniated cervical disc    Hypertension    IBS (irritable bowel syndrome)    Lumbar pain with radiation down left leg 01/30/2014   Migraines    since 2002   Migraines    Nausea 10/30/2020   Renal calculi    Shingles    Sleep apnea    does not wear c-pap   Stressful life events affecting family and household 05/30/2019   Xiphoid pain 05/30/2019  Past Surgical History:  Procedure Laterality Date   ANTERIOR CERVICAL DECOMP/DISCECTOMY FUSION N/A 08/15/2013   Procedure: Anterior Cervical Four-Five/Five-Six/Six-Seven Decompression and Fusion;  Surgeon: Tia Alert, MD;  Location: [facility];  Service: Neurosurgery;  Laterality: N/A;   LITHOTRIPSY     WISDOM TOOTH EXTRACTION      Family History  Problem Relation Age of Onset   Headache Mother    Cancer Father        prostate and kidney cancer   Prostate cancer Father    Kidney cancer Father    Kidney Stones Sister    Migraines Sister     Social History   Tobacco Use   Smoking status: Never   Smokeless tobacco: Never  Vaping Use   Vaping status: Never Used  Substance Use Topics   Alcohol use:  No    Comment: rarely   Drug use: No    ROS   Objective:   Vitals: BP 125/74 (BP Location: Left Arm)   Pulse 75   Temp 98.3 F (36.8 C) (Oral)   Resp 16   SpO2 98%   Physical Exam  Results for orders placed or performed during the hospital encounter of 11/26/22 (from the past 24 hour(s))  POCT urinalysis dipstick     Status: None   Collection Time: 11/26/22  3:51 PM  Result Value Ref Range   Color, UA yellow yellow   Clarity, UA clear clear   Glucose, UA negative negative mg/dL   Bilirubin, UA negative negative   Ketones, POC UA negative negative mg/dL   Spec Grav, UA 9.562 1.308 - 1.025   Blood, UA negative negative   pH, UA 7.5 5.0 - 8.0   Protein Ur, POC negative negative mg/dL   Urobilinogen, UA 0.2 0.2 or 1.0 E.U./dL   Nitrite, UA Negative Negative   Leukocytes, UA Negative Negative    Assessment and Plan :   PDMP not reviewed this encounter.  No diagnosis found.

## 2022-11-26 NOTE — ED Triage Notes (Signed)
Pt c/o cough x 3 days-also c/o dark urine x 2 days-NAD-steady gait

## 2022-12-05 ENCOUNTER — Encounter: Payer: Self-pay | Admitting: Nurse Practitioner

## 2022-12-05 ENCOUNTER — Other Ambulatory Visit (HOSPITAL_COMMUNITY): Payer: Self-pay

## 2022-12-05 ENCOUNTER — Other Ambulatory Visit: Payer: Self-pay | Admitting: Nurse Practitioner

## 2022-12-05 ENCOUNTER — Other Ambulatory Visit (HOSPITAL_BASED_OUTPATIENT_CLINIC_OR_DEPARTMENT_OTHER): Payer: Self-pay | Admitting: Nurse Practitioner

## 2022-12-05 DIAGNOSIS — E781 Pure hyperglyceridemia: Secondary | ICD-10-CM

## 2022-12-05 MED ORDER — FENOFIBRATE 48 MG PO TABS
48.0000 mg | ORAL_TABLET | Freq: Every day | ORAL | 1 refills | Status: DC
  Filled 2022-12-05: qty 90, 90d supply, fill #0
  Filled 2023-03-23: qty 90, 90d supply, fill #1

## 2022-12-05 MED ORDER — OMEGA-3-ACID ETHYL ESTERS 1 G PO CAPS
1.0000 g | ORAL_CAPSULE | Freq: Two times a day (BID) | ORAL | 5 refills | Status: DC
  Filled 2022-12-05: qty 60, 30d supply, fill #0
  Filled 2023-01-05: qty 60, 30d supply, fill #1
  Filled 2023-02-25: qty 60, 30d supply, fill #2
  Filled 2023-05-10: qty 60, 30d supply, fill #3
  Filled 2023-06-28: qty 60, 30d supply, fill #4
  Filled 2023-08-09: qty 60, 30d supply, fill #5

## 2022-12-06 ENCOUNTER — Other Ambulatory Visit: Payer: Self-pay

## 2022-12-06 DIAGNOSIS — J069 Acute upper respiratory infection, unspecified: Secondary | ICD-10-CM

## 2022-12-14 NOTE — Telephone Encounter (Signed)
Adam, please reach out to [facility] and let him know:  I don't think it would matter which medication he stops first. Either one should be fine.   I don't know that we can get in with pulmonology any sooner than the appointment that is in place. This is an area that, in most cases, is taking several months due to high demand and emergency situations taking priority for immediate needs. We can reach out to the referral coordinator to see if she has any insight if there are other offices that could see him sooner.   Adam, can you ask Caren if she knows of any other office that may get him in sooner? His appointment is scheduled 01/22.  I truly am at a loss. So far we have tried and failed the treatments for COPD/Asthma, infection, reflux medication, and post nasal drip. He is not on any medications that typically would be expected to trigger a cough. His imaging and labs have been normal.   The only other thing I know to offer at this point is sputum culture and CT of the chest.   If he would like to have this done, please let him know that it will be 2-3 weeks before we have results for imaging and due to the delays in radiology and possibly one week or more for results from the culture due to the time it takes to monitor for growth.    Let me know and I will put the orders in.

## 2022-12-18 ENCOUNTER — Encounter: Payer: Self-pay | Admitting: Nurse Practitioner

## 2022-12-19 ENCOUNTER — Encounter (HOSPITAL_BASED_OUTPATIENT_CLINIC_OR_DEPARTMENT_OTHER): Payer: Self-pay | Admitting: Pulmonary Disease

## 2022-12-20 ENCOUNTER — Other Ambulatory Visit (HOSPITAL_BASED_OUTPATIENT_CLINIC_OR_DEPARTMENT_OTHER): Payer: Self-pay

## 2022-12-20 DIAGNOSIS — R053 Chronic cough: Secondary | ICD-10-CM

## 2022-12-20 NOTE — Telephone Encounter (Signed)
Patient has been scheduled for sooner appointment

## 2022-12-21 ENCOUNTER — Ambulatory Visit (HOSPITAL_BASED_OUTPATIENT_CLINIC_OR_DEPARTMENT_OTHER): Payer: 59 | Admitting: Pulmonary Disease

## 2022-12-21 ENCOUNTER — Other Ambulatory Visit (HOSPITAL_BASED_OUTPATIENT_CLINIC_OR_DEPARTMENT_OTHER): Payer: Self-pay

## 2022-12-21 ENCOUNTER — Other Ambulatory Visit (HOSPITAL_COMMUNITY): Payer: Self-pay

## 2022-12-21 ENCOUNTER — Encounter (HOSPITAL_BASED_OUTPATIENT_CLINIC_OR_DEPARTMENT_OTHER): Payer: Self-pay | Admitting: Pulmonary Disease

## 2022-12-21 ENCOUNTER — Ambulatory Visit (HOSPITAL_BASED_OUTPATIENT_CLINIC_OR_DEPARTMENT_OTHER): Payer: 59

## 2022-12-21 VITALS — BP 118/72 | HR 78 | Resp 18 | Ht 70.0 in | Wt 195.0 lb

## 2022-12-21 DIAGNOSIS — R053 Chronic cough: Secondary | ICD-10-CM | POA: Diagnosis not present

## 2022-12-21 LAB — PULMONARY FUNCTION TEST
FEF 25-75 Post: 1.72 L/s
FEF 25-75 Pre: 2.06 L/s
FEF2575-%Change-Post: -16 %
FEF2575-%Pred-Post: 48 %
FEF2575-%Pred-Pre: 58 %
FEV1-%Change-Post: -4 %
FEV1-%Pred-Post: 65 %
FEV1-%Pred-Pre: 68 %
FEV1-Post: 2.59 L
FEV1-Pre: 2.73 L
FEV1FVC-%Change-Post: -3 %
FEV1FVC-%Pred-Pre: 86 %
FEV6-%Change-Post: 1 %
FEV6-%Pred-Post: 80 %
FEV6-%Pred-Pre: 80 %
FEV6-Post: 3.98 L
FEV6-Pre: 3.94 L
FEV6FVC-%Change-Post: 0 %
FEV6FVC-%Pred-Post: 103 %
FEV6FVC-%Pred-Pre: 103 %
FVC-%Change-Post: -1 %
FVC-%Pred-Post: 78 %
FVC-%Pred-Pre: 79 %
FVC-Post: 3.99 L
FVC-Pre: 4.04 L
Post FEV1/FVC ratio: 65 %
Post FEV6/FVC ratio: 100 %
Pre FEV1/FVC ratio: 67 %
Pre FEV6/FVC Ratio: 100 %

## 2022-12-21 MED ORDER — FLUTICASONE-SALMETEROL 250-50 MCG/ACT IN AEPB
1.0000 | INHALATION_SPRAY | Freq: Two times a day (BID) | RESPIRATORY_TRACT | 1 refills | Status: DC
  Filled 2022-12-21: qty 60, 30d supply, fill #0

## 2022-12-21 NOTE — Progress Notes (Signed)
Subjective:   PATIENT ID: [name] GENDER: male DOB: Dec 22, 1975, MRN: [id number]  Chief Complaint  Patient presents with   Consult    Chronic cough x 2 months, dry and productive nothing helps. Has had thick mucous at times.     Reason for Visit: New consult for cough  [name] is a 47 year old male never smoker with HTN, OSA, anxiety, GERD, s/p cervical spinal fusion who presents for evaluation for chronic cough.  He reports chronic cough that he was initially seen on 09/06/22 with his PCP. Never diagnosed with asthma or COPD. He was referred by his PCP, NP Early, for failure of treatment for cough including inhalers (pulmicort neb, budesonide neb, doxycycline, azithromycin), prednisone, reflux and postnasal drip. He was seen in the urgent visits on 11/26/22 with prednisone course. Reports cough has some thick mucous and chest congestion. Sometimes associated with wheezing but this is not daily.   He reports partial response on nebulizers however cough recurred in the afternoon. Has partial response with prednisone. Has been compliant with protonix 40 mg daily for last 3 months. He reports history of needing allergy shots 10 years ago. Has significant allergy history. May have had aspiration episode while he was outside but no clear object he is aware of. Has pets sleep in his bedroom.  Social History: Never smoker Previously worked in ED in [employer]  I have personally reviewed patient's past medical/family/social history, allergies, current medications.  Past Medical History:  Diagnosis Date   Allergy    Anxiety    Cervicalgia 04/17/2012   Chronic neck pain    Chronic pain    DDD (degenerative disc disease)    Herniated cervical disc    Hypertension    IBS (irritable bowel syndrome)    Lumbar pain with radiation down left leg 01/30/2014   Migraines    since 2002   Migraines    Nausea 10/30/2020   Renal calculi    Shingles    Sleep apnea    does not  wear c-pap   Stressful life events affecting family and household 05/30/2019   Xiphoid pain 05/30/2019     Family History  Problem Relation Age of Onset   Headache Mother    Cancer Father        prostate and kidney cancer   Prostate cancer Father    Kidney cancer Father    Kidney Stones Sister    Migraines Sister      Social History   Occupational History    Employer: [employer]  Tobacco Use   Smoking status: Never   Smokeless tobacco: Never  Vaping Use   Vaping status: Never Used  Substance and Sexual Activity   Alcohol use: No    Comment: rarely   Drug use: No   Sexual activity: Yes    Partners: Female    Allergies  Allergen Reactions   Erythromycin Nausea And Vomiting   Lisinopril     Headaches, concern about kidney stones. No allergy to the medication.   Other     PECANS, WALNUTS     Outpatient Medications Prior to Visit  Medication Sig Dispense Refill   budesonide (PULMICORT) 0.25 MG/2ML nebulizer solution Take 2 mLs (0.25 mg total) by nebulization 2 (two) times daily as needed (cough, wheezing). 30 mL 0   famotidine (PEPCID) 20 MG tablet Take 1 tablet (20 mg) by mouth 2 times daily. 30 tablet 2   fenofibrate (TRICOR) 48 MG tablet Take  1 tablet (48 mg total) by mouth daily. 90 tablet 1   fluticasone (FLONASE) 50 MCG/ACT nasal spray Place 2 sprays into both nostrils daily. 16 g 0   ipratropium-albuterol (DUONEB) 0.5-2.5 (3) MG/3ML SOLN Take 3 mLs by nebulization every 6 (six) hours as needed. 75 mL 0   naratriptan (AMERGE) 2.5 MG tablet Take 1 tablet at onset of migraine; if returns or does not resolve, may repeat after 4 hours; do not exceed 2 tablets in 24 hours. 20 tablet 6   omega-3 acid ethyl esters (LOVAZA) 1 g capsule Take 1 capsule (1 g total) by mouth 2 (two) times daily. 60 capsule 5   pantoprazole (PROTONIX) 40 MG tablet Take 1 tablet (40 mg total) by mouth daily. 90 tablet 3   propranolol (INDERAL) 40 MG tablet Take 1 tablet (40 mg total) by mouth  2 (two) times daily. 180 tablet 3   Vitamin D, Ergocalciferol, (DRISDOL) 1.25 MG (50000 UNIT) CAPS capsule Take 1 capsule by mouth every 7 days. Take for 6 months. We will then plan to recheck your labs 12 capsule 0   levocetirizine (XYZAL) 5 MG tablet Take 1 tablet (5 mg total) by mouth every evening. 30 tablet 5   amoxicillin-clavulanate (AUGMENTIN) 875-125 MG tablet Take 1 tablet by mouth 2 (two) times daily. 20 tablet 0   methocarbamol (ROBAXIN-750) 750 MG tablet Take 1 tablet (750 mg total) by mouth 3 (three) times daily. 15 tablet 0   Olopatadine HCl 0.2 % SOLN Apply 2 drops to eye daily as needed. 2.5 mL 6   ondansetron (ZOFRAN) 8 MG tablet Take 1 tablet (8 mg total) by mouth every 8 (eight) hours as needed for nausea or vomiting. (Patient not taking: Reported on 09/06/2022) 20 tablet 2   predniSONE (DELTASONE) 20 MG tablet Take 2 tablets daily with breakfast. 10 tablet 0   promethazine-dextromethorphan (PROMETHAZINE-DM) 6.25-15 MG/5ML syrup Take 5 mLs by mouth 3 (three) times daily as needed for cough. 200 mL 0   No facility-administered medications prior to visit.    Review of Systems  Constitutional:  Negative for chills, diaphoresis, fever, malaise/fatigue and weight loss.  HENT:  Negative for congestion.   Respiratory:  Negative for cough, hemoptysis, sputum production, shortness of breath and wheezing.   Cardiovascular:  Negative for chest pain, palpitations and leg swelling.     Objective:   Vitals:   12/21/22 1146  BP: 118/72  Pulse: 78  Resp: 18  SpO2: 98%  Weight: 195 lb (88.5 kg)  Height: 5\' 10"  (1.778 m)   SpO2: 98 %  Physical Exam: General: Well-appearing, no acute distress HENT: [place], AT Eyes: EOMI, no scleral icterus Respiratory: Clear to auscultation bilaterally.  No crackles, wheezing or rales Cardiovascular: RRR, -M/R/G, no JVD Extremities:-Edema,-tenderness Neuro: AAO x4, CNII-XII grossly intact Psych: Normal mood, normal affect  Data  Reviewed:  Imaging: CXR 09/14/22 - No infiltrate effusion or edema  PFT: 12/21/22 FVC 3.99 (78%) FEV1 2.59 (65%) Ratio 67   Interpretation: Borderline obstructive defect may be due to coughing during test. Normal F-V loops. No significant bronchodilator response  Labs: CBC    Component Value Date/Time   WBC 6.7 01/11/2022 1153   WBC 7.1 08/09/2021 1920   RBC 5.06 01/11/2022 1153   RBC 5.09 08/09/2021 1920   HGB 15.1 01/11/2022 1153   HCT 43.6 01/11/2022 1153   PLT 323 01/11/2022 1153   MCV 86 01/11/2022 1153   MCH 29.8 01/11/2022 1153   MCH 29.9 08/09/2021 1920  MCHC 34.6 01/11/2022 1153   MCHC 34.8 08/09/2021 1920   RDW 12.4 01/11/2022 1153   LYMPHSABS 1.9 01/11/2022 1153   MONOABS 0.5 12/20/2018 1929   EOSABS 0.1 01/11/2022 1153   BASOSABS 0.1 01/11/2022 1153   Absolute eos 01/11/22 - 100     Assessment & Plan:   Discussion: 47 year old male never smoker with HTN, OSA, anxiety, GERD who presents for evaluation for chronic cough. Has had trial of nebulizers (no maintenance inhalers) and prednisone with partial improvement. No change with PPI challenge.Hx of allergies requiring shots but not currently on any. Suspect this could be post-viral cough variant asthma/chronic bronchitis. After discussion will trial maintenance inhalers with medium dosed ICS/LABA. Will also evaluate with CT chest for parenchymal disease since CXR negative. Could consider bronchoscopy if all above testing/trials fail.   Chronic bronchitis --START Wixela 250-50 mcg ONE puff in the morning and evening. Rinse mouth out after use --CONTINUE Albuterol AS NEEDED for shortness of breath or wheezing or cough --STOP nebulizers  Chronic cough, failed treatment --ORDER CT Chest without contrast --If negative will need to consider bronchoscopy for evaluation  Health Maintenance Immunization History  Administered Date(s) Administered   Influenza,inj,Quad PF,6+ Mos 10/30/2020   Influenza-Unspecified  11/21/2018, 10/18/2019, 10/17/2021, 11/17/2022   Moderna Sars-Covid-2 Vaccination 04/12/2019, 10/18/2019   Tdap 01/11/2022   CT Lung Screen - not qualified  Orders Placed This Encounter  Procedures   CT Chest Wo Contrast    Standing Status:   Future    Standing Expiration Date:   12/21/2023    Order Specific Question:   Preferred imaging location?    Answer:   [facility]   CBC with Differential   IgE   Meds ordered this encounter  Medications   fluticasone-salmeterol (WIXELA INHUB) 250-50 MCG/ACT AEPB    Sig: Inhale 1 puff into the lungs in the morning and at bedtime.    Dispense:  60 each    Refill:  1    Return in about 1 month (around 01/21/2023) for after CT scan.  I have spent a total time of 45-minutes on the day of the appointment reviewing prior documentation, coordinating care and discussing medical diagnosis and plan with the patient/family. Imaging, labs and tests included in this note have been reviewed and interpreted independently by me.  Trevion Hoben Mechele Collin, MD  Pulmonary Critical Care 12/21/2022 12:35 PM

## 2022-12-21 NOTE — Patient Instructions (Signed)
Pre/Post Spirometry performed today.

## 2022-12-21 NOTE — Progress Notes (Signed)
Pre/Post Spirometry performed today.

## 2022-12-21 NOTE — Patient Instructions (Signed)
Chronic bronchitis --START Wixela 250-50 mcg ONE puff in the morning and evening. Rinse mouth out after use --CONTINUE Albuterol AS NEEDED for shortness of breath or wheezing or cough --STOP nebulizers  Chronic cough, failed treatment --ORDER CT Chest without contrast --If negative will need to consider bronchoscopy for evaluation

## 2022-12-22 ENCOUNTER — Telehealth (HOSPITAL_BASED_OUTPATIENT_CLINIC_OR_DEPARTMENT_OTHER): Payer: Self-pay | Admitting: Pulmonary Disease

## 2022-12-23 LAB — CBC WITH DIFFERENTIAL/PLATELET
Basophils Absolute: 0.1 10*3/uL (ref 0.0–0.2)
Basos: 1 %
EOS (ABSOLUTE): 0.1 10*3/uL (ref 0.0–0.4)
Eos: 1 %
Hematocrit: 44.2 % (ref 37.5–51.0)
Hemoglobin: 14.7 g/dL (ref 13.0–17.7)
Immature Grans (Abs): 0 10*3/uL (ref 0.0–0.1)
Immature Granulocytes: 0 %
Lymphocytes Absolute: 2.1 10*3/uL (ref 0.7–3.1)
Lymphs: 33 %
MCH: 29.6 pg (ref 26.6–33.0)
MCHC: 33.3 g/dL (ref 31.5–35.7)
MCV: 89 fL (ref 79–97)
Monocytes Absolute: 0.4 10*3/uL (ref 0.1–0.9)
Monocytes: 7 %
Neutrophils Absolute: 3.5 10*3/uL (ref 1.4–7.0)
Neutrophils: 58 %
Platelets: 370 10*3/uL (ref 150–450)
RBC: 4.96 x10E6/uL (ref 4.14–5.80)
RDW: 12.7 % (ref 11.6–15.4)
WBC: 6.2 10*3/uL (ref 3.4–10.8)

## 2022-12-23 LAB — IGE: IgE (Immunoglobulin E), Serum: 45 [IU]/mL (ref 6–495)

## 2022-12-27 ENCOUNTER — Encounter (HOSPITAL_BASED_OUTPATIENT_CLINIC_OR_DEPARTMENT_OTHER): Payer: Self-pay | Admitting: Pulmonary Disease

## 2022-12-28 MED ORDER — HYDROCOD POLI-CHLORPHE POLI ER 10-8 MG/5ML PO SUER: 5.0000 mL | Freq: Every evening | ORAL | 0 refills | Status: DC | PRN

## 2023-01-05 ENCOUNTER — Other Ambulatory Visit (HOSPITAL_COMMUNITY): Payer: Self-pay

## 2023-01-12 ENCOUNTER — Encounter (HOSPITAL_BASED_OUTPATIENT_CLINIC_OR_DEPARTMENT_OTHER): Payer: Self-pay | Admitting: Pulmonary Disease

## 2023-01-12 ENCOUNTER — Ambulatory Visit (HOSPITAL_BASED_OUTPATIENT_CLINIC_OR_DEPARTMENT_OTHER)
Admission: RE | Admit: 2023-01-12 | Discharge: 2023-01-12 | Disposition: A | Discharge status: Home / Self Care | Payer: 59 | Source: Ambulatory Visit | Attending: Pulmonary Disease | Admitting: Pulmonary Disease

## 2023-01-12 DIAGNOSIS — R053 Chronic cough: Secondary | ICD-10-CM | POA: Diagnosis not present

## 2023-01-12 DIAGNOSIS — R161 Splenomegaly, not elsewhere classified: Secondary | ICD-10-CM | POA: Diagnosis not present

## 2023-01-14 ENCOUNTER — Encounter: Payer: Self-pay | Admitting: Nurse Practitioner

## 2023-02-02 ENCOUNTER — Encounter: Payer: Self-pay | Admitting: Nurse Practitioner

## 2023-02-02 ENCOUNTER — Telehealth (INDEPENDENT_AMBULATORY_CARE_PROVIDER_SITE_OTHER): Payer: 59 | Admitting: Nurse Practitioner

## 2023-02-02 VITALS — Wt 194.0 lb

## 2023-02-02 DIAGNOSIS — R052 Subacute cough: Secondary | ICD-10-CM

## 2023-02-02 DIAGNOSIS — E781 Pure hyperglyceridemia: Secondary | ICD-10-CM

## 2023-02-02 DIAGNOSIS — I10 Essential (primary) hypertension: Secondary | ICD-10-CM

## 2023-02-02 DIAGNOSIS — K760 Fatty (change of) liver, not elsewhere classified: Secondary | ICD-10-CM | POA: Diagnosis not present

## 2023-02-02 DIAGNOSIS — G4733 Obstructive sleep apnea (adult) (pediatric): Secondary | ICD-10-CM

## 2023-02-02 DIAGNOSIS — K219 Gastro-esophageal reflux disease without esophagitis: Secondary | ICD-10-CM

## 2023-02-02 DIAGNOSIS — I700 Atherosclerosis of aorta: Secondary | ICD-10-CM

## 2023-02-02 DIAGNOSIS — Z6829 Body mass index (BMI) 29.0-29.9, adult: Secondary | ICD-10-CM

## 2023-02-02 NOTE — Progress Notes (Signed)
Virtual Visit Encounter mychart visit.   I connected with  [name] on 02/12/23 at  1:30 PM EST by secure video and audio telemedicine application. I verified that I am speaking with the correct person using two identifiers.   I introduced myself as a Publishing rights manager with the practice. The limitations of evaluation and management by telemedicine discussed with the patient and the availability of in person appointments. The patient expressed verbal understanding and consent to proceed.  Participating parties in this visit include: Myself and patient  The patient is: Patient Location: Home I am: Provider Location: Office/Clinic Subjective:    CC and HPI: [name] is a 48 y.o. year old male presenting for follow up of cough. Patient reports the following:  History of Present Illness [name] presented to discuss recent medical visits, imaging, and plans after a prolonged period of feeling unwell, characterized by fatigue and coughing. Despite undergoing a breathing test and CT scan, the patient continued to experience symptoms. The results of these tests were surprisingly normal, showing clear lungs despite the patient's prolonged coughing.  In the interim, the patient was prescribed Wixela, which he reported has provided some relief and improved his overall condition.  In addition to pharmacological intervention, the patient has made significant lifestyle changes, including regular long-distance walking and dietary modifications. He has incorporated mushroom coffee into his daily routine and has significantly reduced his sugar intake. These changes have reportedly led to a decrease in bloating, an increase in energy, and an overall improvement in his general well-being.  The patient also reported a history of esophageal spasms, which had previously led to a misdiagnosis of a heart attack. He has been managing his medications, reducing the doses of several, including propranolol and  fenofibrate, without any noticeable adverse effects.  The patient has two upcoming trips planned, which have motivated him to continue his health improvements. He expressed some anxiety about his high triglyceride levels and the potential for prediabetes, but is committed to maintaining his lifestyle changes and monitoring his health.  He is committed to maintaining his lifestyle changes and monitoring his health. Past medical history, Surgical history, Family history not pertinant except as noted below, Social history, Allergies, and medications have been entered into the medical record, reviewed, and corrections made.   Review of Systems:  All review of systems negative except what is listed in the HPI  Objective:    Alert and oriented x 4 Speaking in clear sentences with no shortness of breath. No distress.  Impression and Recommendations:    Problem List Items Addressed This Visit     Essential hypertension - Primary   No BP reading today. Stable historically on propranolol. No symptoms. COntinue current treatment      Relevant Orders   Ambulatory referral to Cardiology   Gastroesophageal reflux disease   GERD managed with lifestyle modifications. Discontinued Pepcid and Xyzal; occasionally uses Zyrtec. Discussed benefits of continued lifestyle modifications and as-needed Zyrtec for symptom relief. - Continue current management with lifestyle modifications - Use Zyrtec as needed - Continue with lifestyle changes to aid in reduced symptoms      Hypertriglyceridemia   Elevated cholesterol and triglycerides with familial hyperlipidemia. Significant lifestyle changes including diet modification and increased physical activity. Reduced fenofibrate dose; concerned about triglyceride levels. Discussed benefits of returning to full fenofibrate dose, especially during dietary indulgence. Informed consent included potential side effects and importance of monitoring lipid levels. -  Consider returning to full dose of fenofibrate -  Monitor cholesterol and triglyceride levels - Refer to lipid clinic, possibly with Dr. Duke Salvia      Relevant Orders   Ambulatory referral to Cardiology   Fatty liver   Working on diet and exercise to help reduce risks of complications. Discussion on ways to reverse fatty liver today.      Relevant Orders   Ambulatory referral to Cardiology   Subacute cough   Chronic cough with clear lung imaging and normal spirometry. Differential includes postnasal drip and allergies. Upcoming allergist appointment to reassess allergies and consider immunotherapy. Discussed benefits of allergy shots based on past positive response and potential chronic postnasal drip. Informed consent included weekly shots and anticipated symptom relief. - Continue Wixela as prescribed - Attend allergist appointment on February 09, 2023      Sleep apnea, obstructive   Relevant Orders   Ambulatory referral to Cardiology   BMI 29.0-29.9,adult   Relevant Orders   Ambulatory referral to Cardiology   Other Visit Diagnoses       Atherosclerosis of aorta [facility])       Relevant Orders   Ambulatory referral to Cardiology       current treatment plan is effective, no change in therapy I discussed the assessment and treatment plan with the patient. The patient was provided an opportunity to ask questions and all were answered. The patient agreed with the plan and demonstrated an understanding of the instructions.   The patient was advised to call back or seek an in-person evaluation if the symptoms worsen or if the condition fails to improve as anticipated.  Follow-Up: prn  I provided 40 minutes of non-face-to-face interaction with this non face-to-face encounter including intake, same-day documentation, and chart review.   Tollie Eth, NP , DNP, AGNP-c [facility]

## 2023-02-03 ENCOUNTER — Ambulatory Visit (HOSPITAL_BASED_OUTPATIENT_CLINIC_OR_DEPARTMENT_OTHER): Payer: 59 | Admitting: Pulmonary Disease

## 2023-02-08 ENCOUNTER — Institutional Professional Consult (permissible substitution) (HOSPITAL_BASED_OUTPATIENT_CLINIC_OR_DEPARTMENT_OTHER): Payer: 59 | Admitting: Pulmonary Disease

## 2023-02-09 ENCOUNTER — Other Ambulatory Visit: Payer: Self-pay

## 2023-02-09 ENCOUNTER — Ambulatory Visit: Payer: 59 | Admitting: Allergy & Immunology

## 2023-02-09 ENCOUNTER — Encounter: Payer: Self-pay | Admitting: Allergy & Immunology

## 2023-02-09 ENCOUNTER — Encounter (HOSPITAL_BASED_OUTPATIENT_CLINIC_OR_DEPARTMENT_OTHER): Payer: Self-pay | Admitting: Pulmonary Disease

## 2023-02-09 VITALS — BP 124/80 | HR 73 | Temp 97.4°F | Resp 18 | Ht 69.25 in | Wt 190.1 lb

## 2023-02-09 DIAGNOSIS — J4540 Moderate persistent asthma, uncomplicated: Secondary | ICD-10-CM | POA: Diagnosis not present

## 2023-02-09 DIAGNOSIS — J310 Chronic rhinitis: Secondary | ICD-10-CM

## 2023-02-09 NOTE — Patient Instructions (Addendum)
1. Moderate persistent asthma, uncomplicated - Lung testing looks great overall.  - We may consider changing to the [facility] (since there is a $0 copay card). - We are not going to make any changes at this point in time.  - Daily controller medication(s): Advair 250/43mcg one puff twice daily - Prior to physical activity: albuterol 2 puffs 10-15 minutes before physical activity. - Rescue medications: albuterol 4 puffs every 4-6 hours as needed - Asthma control goals:  * Full participation in all desired activities (may need albuterol before activity) * Albuterol use two time or less a week on average (not counting use with activity) * Cough interfering with sleep two time or less a month * Oral steroids no more than once a year * No hospitalizations  2. Chronic rhinitis - Because of insurance stipulations, we cannot do skin testing on the same day as your first visit. - We are all working to fight this, but for now we need to do two separate visits.  - We will know more after we do testing at the next visit.  - The skin testing visit can be squeezed in at your convenience.  - Then we can make a more full plan to address all of your symptoms. - Be sure to stop your antihistamines for 3 days before this appointment.  - You can continue with the Flonase during this time.  - We are going to do a release of information form so we can get those records and review them.  3. Adverse food reaction (walnuts) - This is not an anaphylactic reaction, therefore there is no need for an EpiPen at this point.   4. Return in about 1 week (around 02/16/2023) for SKIN TESTING (1-55).Marland Kitchen You can have the follow up appointment with Dr. Dellis Anes or a Nurse Practicioner (our Nurse Practitioners are excellent and always have Physician oversight!).    Please inform us of any Emergency Department visits, hospitalizations, or changes in symptoms. Call us before going to the ED for breathing or allergy symptoms since  we might be able to fit you in for a sick visit. Feel free to contact us anytime with any questions, problems, or concerns.  It was a pleasure to meet you today!  Websites that have reliable patient information: 1. American Academy of Asthma, Allergy, and Immunology: www.aaaai.org 2. Food Allergy Research and Education (FARE): foodallergy.org 3. Mothers of Asthmatics: http://www.asthmacommunitynetwork.org 4. American College of Allergy, Asthma, and Immunology: www.acaai.org      "Like" [place] on Facebook and Instagram for our latest updates!      A healthy democracy works best when [employer] participate! Make sure you are registered to vote! If you have moved or changed any of your contact information, you will need to get this updated before voting! Scan the QR codes below to learn more!

## 2023-02-09 NOTE — Progress Notes (Signed)
NEW PATIENT  Date of Service/Encounter:  02/09/23  Consult requested by: Tollie Eth, NP   Assessment:   Moderate persistent asthma, uncomplicated - followed by Dr. Everardo All  Chronic rhinitis - planning for skin testing at the next visit  Headaches - with apparent worsening of the   Plan/Recommendations:   1. Moderate persistent asthma, uncomplicated - Lung testing looks great overall.  - We may consider changing to the [facility] (since there is a $0 copay card). - We are not going to make any changes at this point in time since you have remained stable on the current regimen. - Daily controller medication(s): Advair 250/53mcg one puff twice daily - Prior to physical activity: albuterol 2 puffs 10-15 minutes before physical activity. - Rescue medications: albuterol 4 puffs every 4-6 hours as needed - Asthma control goals:  * Full participation in all desired activities (may need albuterol before activity) * Albuterol use two time or less a week on average (not counting use with activity) * Cough interfering with sleep two time or less a month * Oral steroids no more than once a year * No hospitalizations  2. Chronic rhinitis - Because of insurance stipulations, we cannot do skin testing on the same day as your first visit. - We are all working to fight this, but for now we need to do two separate visits.  - We will know more after we do testing at the next visit.  - The skin testing visit can be squeezed in at your convenience.  - Then we can make a more full plan to address all of your symptoms. - Be sure to stop your antihistamines for 3 days before this appointment.  - You can continue with the Flonase during this time.  - We are going to do a release of information form so we can get those records and review them.  3. Adverse food reaction (walnuts) - This is not an anaphylactic reaction, therefore there is no need for an EpiPen at this point.   4. Return in about 1  week (around 02/16/2023) for SKIN TESTING (1-55).Marland Kitchen You can have the follow up appointment with Dr. Dellis Anes or a Nurse Practicioner (our Nurse Practitioners are excellent and always have Physician oversight!).    This note in its entirety was forwarded to the Provider who requested this consultation.  Subjective:   [name] is a 48 y.o. male presenting today for evaluation of  Chief Complaint  Patient presents with   Cough   red and burning eyes    [name] has a history of the following: Patient Active Problem List   Diagnosis Date Noted   Subacute cough 09/06/2022   Dry eyes, bilateral 04/13/2022   BMI 29.0-29.9,adult 01/12/2022   Cholelithiasis without obstruction 01/11/2022   Fatty liver 01/11/2022   Right upper quadrant pain 01/11/2022   Vitamin D deficiency 04/07/2021   Numerous skin moles 04/05/2021   History of nephrolithiasis 04/05/2021   Seborrheic keratosis of scalp 10/30/2020   Encounter for annual physical exam 10/30/2020   Hypertriglyceridemia 10/30/2020   Anxiety 05/30/2019   Gastroesophageal reflux disease 05/30/2019   Essential hypertension 08/11/2016   Sleep apnea, obstructive 08/11/2016   S/P cervical spinal fusion 08/15/2013   Migraine without aura 04/17/2012    History obtained from: chart review and patient.  Discussed the use of AI scribe software for clinical note transcription with the patient and/or guardian, who gave verbal consent to proceed.  [name]  was referred by Early, Sung Amabile, NP.     [name] is a 48 y.o. male presenting for an evaluation of asthma and allergies .  Asthma/Respiratory Symptom History: [name] reports persistent coughing episodes. These episodes began in August and continued until the end of the year. Despite multiple medications, including antibiotics and steroids, the coughing persisted.  [name] was referred to a pulmonologist, Dr. Everardo All, who conducted pulmonary function tests and prescribed Wixela. He saw  Dr. Everardo All and got some PFTs performed. Wixela seems to be helping.  This was started in December 2024.  Coughing and wheezing has improved.  A chest CT was done in December 2024 and showed no consolidation.  There were scattered vascular calcifications along the coronary arteries.  He was sent to a cardiologist specializing in lipids.  He also had fatty infiltration of his liver as well as mild splenomegaly. He has been prescribed prednisone for his breathing twice in the past six months. The patient denies any smoking history.    Allergic Rhinitis Symptom History: He has sneezing and itchy watery eyes. This is year round. He will have a lot of symptoms when he outdoors. He occasionally has the symptoms even in the winter times. [name] also reports environmental allergies, which cause sneezing and watery eyes. These symptoms occur throughout the year, but are less severe in the winter. The patient previously saw an allergist at [facility] and received injections for a year, which seemed to help with both the allergies and migraines.  [name] has a history of cervical fusion surgery around 2016 and has undergone two lithotripsies.  He has 3 children.  1 is in the [employer], 1 is a IT sales professional, and 1 is in high school and enjoys volleyball.  Otherwise, there is no history of other atopic diseases, including drug allergies, stinging insect allergies, or contact dermatitis. There is no significant infectious history. 6Vaccinations are up to date.    Past Medical History: Patient Active Problem List   Diagnosis Date Noted   Subacute cough 09/06/2022   Dry eyes, bilateral 04/13/2022   BMI 29.0-29.9,adult 01/12/2022   Cholelithiasis without obstruction 01/11/2022   Fatty liver 01/11/2022   Right upper quadrant pain 01/11/2022   Vitamin D deficiency 04/07/2021   Numerous skin moles 04/05/2021   History of nephrolithiasis 04/05/2021   Seborrheic keratosis of scalp 10/30/2020   Encounter for  annual physical exam 10/30/2020   Hypertriglyceridemia 10/30/2020   Anxiety 05/30/2019   Gastroesophageal reflux disease 05/30/2019   Essential hypertension 08/11/2016   Sleep apnea, obstructive 08/11/2016   S/P cervical spinal fusion 08/15/2013   Migraine without aura 04/17/2012    Medication List:  Allergies as of 02/09/2023       Reactions   Erythromycin Nausea And Vomiting   Lisinopril    Headaches, concern about kidney stones. No allergy to the medication.   Other    PECANS, WALNUTS        Medication List        Accurate as of February 09, 2023  9:21 AM. If you have any questions, ask your nurse or doctor.          STOP taking these medications    budesonide 0.25 MG/2ML nebulizer solution Commonly known as: PULMICORT Stopped by: Alfonse Spruce   chlorpheniramine-HYDROcodone 10-8 MG/5ML Commonly known as: TUSSIONEX Stopped by: Alfonse Spruce   famotidine 20 MG tablet Commonly known as: PEPCID Stopped by: Alfonse Spruce   ipratropium-albuterol 0.5-2.5 (3) MG/3ML Soln Commonly known  as: DUONEB Stopped by: Alfonse Spruce       TAKE these medications    fenofibrate 48 MG tablet Commonly known as: Tricor Take 1 tablet (48 mg total) by mouth daily.   fluticasone 50 MCG/ACT nasal spray Commonly known as: FLONASE Place 2 sprays into both nostrils daily.   fluticasone-salmeterol 250-50 MCG/ACT Aepb Commonly known as: Wixela Inhub Inhale 1 puff into the lungs in the morning and at bedtime.   naratriptan 2.5 MG tablet Commonly known as: AMERGE Take 1 tablet by mouth at onset of migraine; may repeat dose after 4 hours if migraine returns or does not resolve. Max 2 doses in 24 hours. (Take 1 tablet at onset of migraine; if returns or does not resolve, may repeat after 4 hours; do not exceed 2 tablets in 24 hours.)   omega-3 acid ethyl esters 1 g capsule Commonly known as: Lovaza Take 1 capsule (1 g total) by mouth 2 (two) times  daily.   ondansetron 8 MG tablet Commonly known as: ZOFRAN Take 8 mg by mouth every 8 (eight) hours as needed for nausea or vomiting.   pantoprazole 40 MG tablet Commonly known as: PROTONIX Take 1 tablet (40 mg total) by mouth daily.   propranolol 40 MG tablet Commonly known as: INDERAL Take 1 tablet (40 mg total) by mouth 2 (two) times daily.   Vitamin D (Ergocalciferol) 1.25 MG (50000 UNIT) Caps capsule Commonly known as: DRISDOL Take 1 capsule by mouth every 7 days. Take for 6 months. We will then plan to recheck your labs        Birth History: non-contributory  Developmental History: non-contributory  Past Surgical History: Past Surgical History:  Procedure Laterality Date   ANTERIOR CERVICAL DECOMP/DISCECTOMY FUSION N/A 08/15/2013   Procedure: Anterior Cervical Four-Five/Five-Six/Six-Seven Decompression and Fusion;  Surgeon: Tia Alert, MD;  Location: [facility];  Service: Neurosurgery;  Laterality: N/A;   LITHOTRIPSY     WISDOM TOOTH EXTRACTION       Family History: Family History  Problem Relation Age of Onset   Headache Mother    Cancer Father        prostate and kidney cancer   Prostate cancer Father    Kidney cancer Father    Kidney Stones Sister    Migraines Sister      Social History: [name] lives at home with his family.  He has in a house that is 50 years old.  There is hardwood in [place] in the areas and carpeting in the bedroom.  He has dogs, fish, and birds inside of the home.  There are no dust mite covers on the bedding.  There is no tobacco exposure.  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  There is no fume, chemical, or dust exposure. 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 He and his wife have been together for 25 years.   Review of systems otherwise negative other than that mentioned in the HPI.    Objective:   Blood pressure 124/80, pulse 73, temperature (!) 97.4 F (36.3  C), temperature source Temporal, resp. rate 18, height 5' 9.25" (1.759 m), weight 190 lb 1.6 oz (86.2 kg), SpO2 96%. Body mass index is 27.87 kg/m.     Physical Exam Vitals reviewed.  Constitutional:      Appearance: He is well-developed.     Comments: Pleasant. Cooperative with the exam.   HENT:  Head: Normocephalic and atraumatic.     Right Ear: Tympanic membrane, ear canal and external ear normal. No drainage, swelling or tenderness. Tympanic membrane is not injected, scarred, erythematous, retracted or bulging.     Left Ear: Tympanic membrane, ear canal and external ear normal. No drainage, swelling or tenderness. Tympanic membrane is not injected, scarred, erythematous, retracted or bulging.     Nose: No nasal deformity, septal deviation, mucosal edema or rhinorrhea.     Right Turbinates: Enlarged, swollen and pale.     Left Turbinates: Enlarged, swollen and pale.     Right Sinus: No maxillary sinus tenderness or frontal sinus tenderness.     Left Sinus: No maxillary sinus tenderness or frontal sinus tenderness.     Comments: No nasal polyps noted.     Mouth/Throat:     Mouth: Mucous membranes are not pale and not dry.     Pharynx: Uvula midline.  Eyes:     General:        Right eye: No discharge.        Left eye: No discharge.     Conjunctiva/sclera: Conjunctivae normal.     Right eye: Right conjunctiva is not injected. No chemosis.    Left eye: Left conjunctiva is not injected. No chemosis.    Pupils: Pupils are equal, round, and reactive to light.  Cardiovascular:     Rate and Rhythm: Normal rate and regular rhythm.     Heart sounds: Normal heart sounds.  Pulmonary:     Effort: Pulmonary effort is normal. No tachypnea, accessory muscle usage or respiratory distress.     Breath sounds: Normal breath sounds. No wheezing, rhonchi or rales.     Comments: Moving air well in all lung fields. No increased work of breathing noted.  Chest:     Chest wall: No tenderness.   Abdominal:     Tenderness: There is no abdominal tenderness. There is no guarding or rebound.  Lymphadenopathy:     Head:     Right side of head: No submandibular, tonsillar or occipital adenopathy.     Left side of head: No submandibular, tonsillar or occipital adenopathy.     Cervical: No cervical adenopathy.  Skin:    General: Skin is warm.     Capillary Refill: Capillary refill takes less than 2 seconds.     Coloration: Skin is not pale.     Findings: No abrasion, erythema, petechiae or rash. Rash is not papular, urticarial or vesicular.  Neurological:     Mental Status: He is alert.  Psychiatric:        Behavior: Behavior is cooperative.      Diagnostic studies:    Spirometry: results normal (FEV1: 2.95/76%, FVC: 3.92/80%, FEV1/FVC: 75%).    Spirometry consistent with normal pattern.    Allergy Studies: deferred due to insurance stipulations that require a separate visit for testing           Malachi Bonds, MD [facility]

## 2023-02-10 ENCOUNTER — Encounter: Payer: Self-pay | Admitting: Nurse Practitioner

## 2023-02-10 ENCOUNTER — Other Ambulatory Visit: Payer: Self-pay

## 2023-02-10 ENCOUNTER — Other Ambulatory Visit (HOSPITAL_COMMUNITY): Payer: Self-pay

## 2023-02-10 MED ORDER — BREZTRI AEROSPHERE 160-9-4.8 MCG/ACT IN AERO
2.0000 | INHALATION_SPRAY | Freq: Two times a day (BID) | RESPIRATORY_TRACT | 1 refills | Status: AC
  Filled 2023-02-10: qty 21.4, 60d supply, fill #0

## 2023-02-12 ENCOUNTER — Encounter: Payer: Self-pay | Admitting: Nurse Practitioner

## 2023-02-12 NOTE — Assessment & Plan Note (Signed)
GERD managed with lifestyle modifications. Discontinued Pepcid and Xyzal; occasionally uses Zyrtec. Discussed benefits of continued lifestyle modifications and as-needed Zyrtec for symptom relief. - Continue current management with lifestyle modifications - Use Zyrtec as needed - Continue with lifestyle changes to aid in reduced symptoms

## 2023-02-12 NOTE — Assessment & Plan Note (Signed)
Working on diet and exercise to help reduce risks of complications. Discussion on ways to reverse fatty liver today.

## 2023-02-12 NOTE — Assessment & Plan Note (Signed)
Elevated cholesterol and triglycerides with familial hyperlipidemia. Significant lifestyle changes including diet modification and increased physical activity. Reduced fenofibrate dose; concerned about triglyceride levels. Discussed benefits of returning to full fenofibrate dose, especially during dietary indulgence. Informed consent included potential side effects and importance of monitoring lipid levels. - Consider returning to full dose of fenofibrate - Monitor cholesterol and triglyceride levels - Refer to lipid clinic, possibly with Dr. Duke Salvia

## 2023-02-12 NOTE — Assessment & Plan Note (Signed)
No BP reading today. Stable historically on propranolol. No symptoms. COntinue current treatment

## 2023-02-12 NOTE — Assessment & Plan Note (Signed)
Chronic cough with clear lung imaging and normal spirometry. Differential includes postnasal drip and allergies. Upcoming allergist appointment to reassess allergies and consider immunotherapy. Discussed benefits of allergy shots based on past positive response and potential chronic postnasal drip. Informed consent included weekly shots and anticipated symptom relief. - Continue Wixela as prescribed - Attend allergist appointment on February 09, 2023

## 2023-02-16 ENCOUNTER — Ambulatory Visit: Payer: 59 | Admitting: Allergy & Immunology

## 2023-02-25 ENCOUNTER — Other Ambulatory Visit (HOSPITAL_BASED_OUTPATIENT_CLINIC_OR_DEPARTMENT_OTHER): Payer: Self-pay | Admitting: Nurse Practitioner

## 2023-02-25 DIAGNOSIS — I10 Essential (primary) hypertension: Secondary | ICD-10-CM

## 2023-02-26 ENCOUNTER — Other Ambulatory Visit: Payer: Self-pay

## 2023-02-27 ENCOUNTER — Other Ambulatory Visit (HOSPITAL_COMMUNITY): Payer: Self-pay

## 2023-02-27 ENCOUNTER — Other Ambulatory Visit: Payer: Self-pay

## 2023-02-27 MED ORDER — PROPRANOLOL HCL 40 MG PO TABS
40.0000 mg | ORAL_TABLET | Freq: Two times a day (BID) | ORAL | 1 refills | Status: DC
  Filled 2023-02-27: qty 180, 90d supply, fill #0

## 2023-03-08 ENCOUNTER — Telehealth: Payer: 59 | Admitting: Nurse Practitioner

## 2023-03-08 ENCOUNTER — Encounter: Payer: Self-pay | Admitting: Nurse Practitioner

## 2023-03-08 ENCOUNTER — Telehealth: Payer: Self-pay

## 2023-03-08 ENCOUNTER — Other Ambulatory Visit (HOSPITAL_COMMUNITY): Payer: Self-pay

## 2023-03-08 VITALS — Wt 192.0 lb

## 2023-03-08 DIAGNOSIS — G43701 Chronic migraine without aura, not intractable, with status migrainosus: Secondary | ICD-10-CM

## 2023-03-08 DIAGNOSIS — J0140 Acute pansinusitis, unspecified: Secondary | ICD-10-CM

## 2023-03-08 DIAGNOSIS — T753XXA Motion sickness, initial encounter: Secondary | ICD-10-CM | POA: Insufficient documentation

## 2023-03-08 DIAGNOSIS — F40243 Fear of flying: Secondary | ICD-10-CM

## 2023-03-08 DIAGNOSIS — R052 Subacute cough: Secondary | ICD-10-CM | POA: Diagnosis not present

## 2023-03-08 HISTORY — DX: Motion sickness, initial encounter: T75.3XXA

## 2023-03-08 HISTORY — DX: Acute pansinusitis, unspecified: J01.40

## 2023-03-08 MED ORDER — AMOXICILLIN-POT CLAVULANATE 875-125 MG PO TABS: 1.0000 | ORAL_TABLET | Freq: Two times a day (BID) | ORAL | 0 refills | Status: DC

## 2023-03-08 MED ORDER — ALPRAZOLAM 0.25 MG PO TABS: ORAL_TABLET | ORAL | 0 refills | Status: DC

## 2023-03-08 MED ORDER — SCOPOLAMINE 1 MG/3DAYS TD PT72: 1.0000 | MEDICATED_PATCH | TRANSDERMAL | 1 refills | Status: DC

## 2023-03-08 MED ORDER — HYDROCODONE BIT-HOMATROP MBR 5-1.5 MG/5ML PO SOLN: 5.0000 mL | Freq: Every evening | ORAL | 0 refills | Status: DC | PRN

## 2023-03-08 MED ORDER — NURTEC 75 MG PO TBDP
ORAL_TABLET | ORAL | 3 refills | Status: AC
  Filled 2023-12-29: qty 30, 60d supply, fill #0

## 2023-03-08 NOTE — Assessment & Plan Note (Signed)
 Currently using Wixela but has Clinical cytogeneticist available. Breztri recommended during illness due to additional medication benefits. Both contain inhaled steroids, which can cause oral thrush. Emphasized rinsing mouth after use to prevent thrush. - Switch to Breztri during illness - Rinse mouth after using Breztri - Resume Wixela post-illness

## 2023-03-08 NOTE — Telephone Encounter (Signed)
 Pharmacy Patient Advocate Encounter   Received notification from CoverMyMeds that prior authorization for Nurtec 75MG  dispersible tablets  is required/requested.   Insurance verification completed.   The patient is insured through [facility] .   Per test claim: PA required; PA submitted to above mentioned insurance via CoverMyMeds Key/confirmation #/EOC Key: [id number]    Status is pending

## 2023-03-08 NOTE — Assessment & Plan Note (Signed)
 Anxious about potential motion sickness during upcoming cruise and flight. Scopolamine patch recommended for prevention. Discussed application timing and potential side effects, including temporary vision changes. - Prescribe scopolamine patch - Apply patch 12 hours before flight and 24 hours before cruise - Replace patch every three days

## 2023-03-08 NOTE — Progress Notes (Signed)
 Virtual Visit Encounter mychart visit.   I connected with  [name] on 03/08/23 at 11:15 AM EST by secure video and audio telemedicine application. I verified that I am speaking with the correct person using two identifiers.   I introduced myself as a Publishing rights manager with the practice. The limitations of evaluation and management by telemedicine discussed with the patient and the availability of in person appointments. The patient expressed verbal understanding and consent to proceed.  Participating parties in this visit include: Myself and patient  The patient is: Patient Location: Home I am: Provider Location: Office/Clinic Subjective:    CC and HPI: [name] is a 48 y.o. year old male presenting for new evaluation and treatment of sinus symptoms and up coming travel.  History of Present Illness [name] is a 48 year old male who presents with symptoms of a possible sinus infection before a planned cruise trip.  He is experiencing symptoms suggestive of a sinus infection, including a scratchy throat, congestion with thick mucus, and a mild cough. These symptoms began a couple of days ago. No fever, chills, or body aches are present. He is concerned about these symptoms due to an upcoming cruise with his family, which includes flying and being in close quarters on a ship.  He has been using honey and honey drops to soothe his throat and has been drinking warm tea to help with the congestion. He is also using Wixela, an inhaler, which he feels helps somewhat. He has [name] available but has not yet tried it. He is anxious about flying and the potential for illness on the cruise, and he is taking precautions such as bringing wipes and sanitizers. He is currently taking a weekly dose of vitamin D and plans to take vitamin C and zinc to boost his immune system before the trip.  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 He is considering taking additional measures to manage this anxiety during the trip.  He has a history of migraines and is currently using naratriptan (Amerge) for management. He experiences approximately six to ten migraines per month, often preemptively treating them to prevent full-blown episodes. He has tried Relpax and Imitrex in the past.  He is concerned about motion sickness during the cruise and is considering using a scopolamine patch.  His daughter, [name], experienced a sore throat recently but felt better throughout the day. No other family members are currently sick.   Past medical history, Surgical history, Family history not pertinant except as noted below, Social history, Allergies, and medications have been entered into the medical record, reviewed, and corrections made.   Review of Systems:  All review of systems negative except what is listed in the HPI  Objective:    Alert and oriented x 4 Audibly congested Speaking in clear sentences with no shortness of breath. No distress.  Impression and Recommendations:    Problem List Items Addressed This Visit     Chronic migraine without aura with status migrainosus, not intractable   Experiences about 10 migraines per month. Currently on naratriptan (Amerge), but having difficulty breaking about 1/2 of these with the medication. Interested in Nurtec for both preventative and acute treatment. Discussed prior authorization process. Nurtec is well-tolerated with minimal side effects, can be taken every other day as preventative or as needed for breakthrough migraines. He has been on relpax, imitrex, amerge, and propranolol without improvement.  -  Submit prior authorization for Nurtec - Instruct on Nurtec usage: one tablet every other day as preventative, can take on non-dose day if migraine occurs but skip next scheduled dose - Continue Amerge for breakthrough migraines if needed while awaiting      Relevant Medications    Rimegepant Sulfate (NURTEC) 75 MG TBDP   Anxiety with flying   Significant anxiety related to flying and upcoming cruise. Xanax offered for situational anxiety management during travel. Discussed usage as needed. - Prescribe Xanax for use during flights and cruise      Relevant Medications   ALPRAZolam (XANAX) 0.25 MG tablet   Subacute cough   Currently using Wixela but has Clinical cytogeneticist available. Breztri recommended during illness due to additional medication benefits. Both contain inhaled steroids, which can cause oral thrush. Emphasized rinsing mouth after use to prevent thrush. - Switch to Breztri during illness - Rinse mouth after using Breztri - Resume Wixela post-illness      Acute non-recurrent pansinusitis - Primary   Symptoms include scratchy throat, thick mucus, and mild cough. No fever, chills, or body aches, suggesting sinus infection rather than flu. Discussed Augmentin benefits for bacterial sinus infections and importance of completing the full course. Advised on warm tea with honey to soothe throat and break up chest congestion. OK to continue mucinex.  - Prescribe Augmentin - Recommend warm tea with honey      Relevant Medications   amoxicillin-clavulanate (AUGMENTIN) 875-125 MG tablet   HYDROcodone bit-homatropine (HYCODAN) 5-1.5 MG/5ML syrup   Motion sickness   Anxious about potential motion sickness during upcoming cruise and flight. Scopolamine patch recommended for prevention. Discussed application timing and potential side effects, including temporary vision changes. - Prescribe scopolamine patch - Apply patch 12 hours before flight and 24 hours before cruise - Replace patch every three days      Relevant Medications   scopolamine (TRANSDERM-SCOP) 1 MG/3DAYS   General Health Maintenance Advised to boost immune system with additional vitamin C and zinc during travel. Discussed appropriate dosages and benefits. - Continue weekly vitamin D - Take vitamin C  daily - Take zinc 15-30 mg daily orders and follow up as documented in EMR I discussed the assessment and treatment plan with the patient. The patient was provided an opportunity to ask questions and all were answered. The patient agreed with the plan and demonstrated an understanding of the instructions.   The patient was advised to call back or seek an in-person evaluation if the symptoms worsen or if the condition fails to improve as anticipated.  Follow-Up: prn  I provided 33 minutes of non-face-to-face interaction with this non face-to-face encounter including intake, same-day documentation, and chart review.   Tollie Eth, NP , DNP, AGNP-c [facility]

## 2023-03-08 NOTE — Assessment & Plan Note (Signed)
 Symptoms include scratchy throat, thick mucus, and mild cough. No fever, chills, or body aches, suggesting sinus infection rather than flu. Discussed Augmentin benefits for bacterial sinus infections and importance of completing the full course. Advised on warm tea with honey to soothe throat and break up chest congestion. OK to continue mucinex.  - Prescribe Augmentin - Recommend warm tea with honey

## 2023-03-08 NOTE — Assessment & Plan Note (Signed)
 Experiences about 10 migraines per month. Currently on naratriptan (Amerge), but having difficulty breaking about 1/2 of these with the medication. Interested in Nurtec for both preventative and acute treatment. Discussed prior authorization process. Nurtec is well-tolerated with minimal side effects, can be taken every other day as preventative or as needed for breakthrough migraines. He has been on relpax, imitrex, amerge, and propranolol without improvement.  - Submit prior authorization for Nurtec - Instruct on Nurtec usage: one tablet every other day as preventative, can take on non-dose day if migraine occurs but skip next scheduled dose - Continue Amerge for breakthrough migraines if needed while awaiting

## 2023-03-08 NOTE — Patient Instructions (Addendum)
 VISIT SUMMARY:  Today, we addressed your symptoms of a possible sinus infection, your asthma management, migraine prevention, concerns about motion sickness, and anxiety related to your upcoming cruise trip. We also discussed general health maintenance to boost your immune system during travel.  YOUR PLAN:  -SINUS INFECTION: A sinus infection is an inflammation of the sinuses, often causing symptoms like a scratchy throat, thick mucus, and a mild cough. We have prescribed Augmentin to treat the infection and recommended continuing with warm tea and honey to soothe your throat and help with congestion.  -ASTHMA: Asthma is a condition where your airways narrow and swell, producing extra mucus. We recommend switching to [facility] during your illness for its additional benefits and to rinse your mouth after use to prevent oral thrush. You can resume using Wixela after your illness.  -MIGRAINE: Migraines are severe headaches often accompanied by nausea and sensitivity to light and sound. We will submit a prior authorization for Nurtec, which can be used both as a preventative and acute treatment. You should take one tablet every other day as a preventative measure and continue using Amerge for breakthrough migraines if needed.  -MOTION SICKNESS: Motion sickness is a condition that causes nausea and dizziness when traveling. We have prescribed a scopolamine patch to prevent motion sickness. Apply the patch 12 hours before your flight and 24 hours before the cruise, and replace it every three days.  -ANXIETY: Anxiety is a feeling of worry or fear that can be particularly intense in certain situations. We have prescribed Xanax to help manage your situational anxiety during flights and the cruise. Use it as needed.  -GENERAL HEALTH MAINTENANCE: To boost your immune system during travel, continue taking your weekly vitamin D, and add daily doses of vitamin C and zinc (15-30 mg).  INSTRUCTIONS:  We have sent  your prescriptions to [facility] on 2/20. Please follow the instructions for each medication and contact us if you have any questions or concerns.

## 2023-03-08 NOTE — Assessment & Plan Note (Signed)
 Significant anxiety related to flying and upcoming cruise. Xanax offered for situational anxiety management during travel. Discussed usage as needed. - Prescribe Xanax for use during flights and cruise

## 2023-03-08 NOTE — Telephone Encounter (Signed)
 Pharmacy Patient Advocate Encounter  Received notification from [employer] that Prior Authorization for Nurtec 75MG  dispersible tablets has been APPROVED from 2.19.25 to 8.19.25. Ran test claim, Copay is $19.97. This test claim was processed through [facility]- copay amounts may vary at other pharmacies due to pharmacy/plan contracts, or as the patient moves through the different stages of their insurance plan.   PA #/Case ID/Reference #: Key: [id number]

## 2023-03-23 ENCOUNTER — Other Ambulatory Visit (HOSPITAL_BASED_OUTPATIENT_CLINIC_OR_DEPARTMENT_OTHER): Payer: Self-pay | Admitting: Nurse Practitioner

## 2023-03-23 ENCOUNTER — Other Ambulatory Visit (HOSPITAL_COMMUNITY): Payer: Self-pay

## 2023-03-23 ENCOUNTER — Encounter: Payer: Self-pay | Admitting: Nurse Practitioner

## 2023-03-23 ENCOUNTER — Other Ambulatory Visit: Payer: Self-pay

## 2023-03-23 DIAGNOSIS — K219 Gastro-esophageal reflux disease without esophagitis: Secondary | ICD-10-CM

## 2023-03-23 MED ORDER — PANTOPRAZOLE SODIUM 40 MG PO TBEC
40.0000 mg | DELAYED_RELEASE_TABLET | Freq: Every day | ORAL | 1 refills | Status: DC
Start: 2023-03-23 — End: 2023-05-16
  Filled 2023-03-23: qty 90, 90d supply, fill #0

## 2023-03-29 ENCOUNTER — Encounter: Payer: Self-pay | Admitting: Nurse Practitioner

## 2023-04-05 DIAGNOSIS — G4733 Obstructive sleep apnea (adult) (pediatric): Secondary | ICD-10-CM | POA: Diagnosis not present

## 2023-04-10 ENCOUNTER — Other Ambulatory Visit (HOSPITAL_COMMUNITY): Payer: Self-pay

## 2023-04-12 ENCOUNTER — Encounter: Payer: Self-pay | Admitting: Nurse Practitioner

## 2023-04-14 ENCOUNTER — Encounter: Payer: 59 | Admitting: Nurse Practitioner

## 2023-04-15 ENCOUNTER — Other Ambulatory Visit (HOSPITAL_BASED_OUTPATIENT_CLINIC_OR_DEPARTMENT_OTHER): Payer: Self-pay

## 2023-04-24 NOTE — Progress Notes (Unsigned)
  Cardiology Office Note:  .   Date:  04/25/2023  ID:  [name], DOB 1975-09-21, MRN [id number] PCP: Tollie Eth, NP  [facility] Providers Cardiologist:  None    History of Present Illness: .   [name] is a 48 y.o. male who is referred to [place] for further evaluation of some episodes of CP   Seen with wife Victorino Dike .   Has had years of chest pressure .   He is a chest pain in the past.  He had a Myoview study in July, 2010 which was normal.   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 Hx Father had HTN Mother - healthy Sisters do not have any heart disease          ROS:   Studies Reviewed: Marland Kitchen   EKG Interpretation Date/Time:  Tuesday April 25 2023 10:11:16 EDT Ventricular Rate:  64 PR Interval:  182 QRS Duration:  96 QT Interval:  372 QTC Calculation: 383 R Axis:   -4  Text Interpretation: Normal sinus rhythm Normal ECG When compared with ECG of 10-Aug-2021 01:02, the Inferior Q waves are no longer seen Confirmed by Kristeen Miss ([id number]) on 04/25/2023 10:17:34 AM     Risk Assessment/Calculations:             Physical Exam:   VS:  BP 108/82   Pulse 70   Ht 5' 9.25" (1.759 m)   Wt 189 lb 9.6 oz (86 kg)   SpO2 97%   BMI 27.80 kg/m    Wt Readings from Last 3 Encounters:  04/25/23 189 lb 9.6 oz (86 kg)  03/08/23 192 lb (87.1 kg)  02/09/23 190 lb 1.6 oz (86.2 kg)    GEN: Well nourished, well developed in no acute distress NECK: No JVD; No carotid bruits CARDIAC: RRR, no murmurs, rubs, gallops RESPIRATORY:  Clear to auscultation without rales, wheezing or rhonchi  ABDOMEN: Soft, non-tender,  non-distended EXTREMITIES:  No edema; No deformity   ASSESSMENT AND PLAN: .   1.  Chest pains: His chest pain sound completely atypical.  He has been told that he has gallstones.  His chest pains occur randomly and occasionally.  They are not associated with exercise.  Will get a coronary calcium score for further evaluation.  He does not have a strong family history of cardiac disease.  He will be getting lipid levels from his primary medical doctor very soon.  I have recommended that he start a regular exercise program.  I have given him a goal of walking between 4 and 5 miles 3 days a week to 4 days a week.  Of asked him to look into getting a book called good energy by Baird Lyons and [employer].    Return to see [place] in 6 months.       Dispo: 6 months    Signed, Kristeen Miss, MD

## 2023-04-25 ENCOUNTER — Encounter: Payer: Self-pay | Admitting: Cardiovascular Disease

## 2023-04-25 ENCOUNTER — Ambulatory Visit: Payer: Self-pay | Attending: Cardiology | Admitting: Cardiovascular Disease

## 2023-04-25 VITALS — BP 108/82 | HR 70 | Ht 69.25 in | Wt 189.6 lb

## 2023-04-25 DIAGNOSIS — R079 Chest pain, unspecified: Secondary | ICD-10-CM

## 2023-04-25 DIAGNOSIS — Z7689 Persons encountering health services in other specified circumstances: Secondary | ICD-10-CM | POA: Diagnosis not present

## 2023-04-25 NOTE — Patient Instructions (Signed)
 Medication Instructions:  Your physician recommends that you continue on your current medications as directed. Please refer to the Current Medication list given to you today.  *If you need a refill on your cardiac medications before your next appointment, please call your pharmacy*  Lab Work: None ordered today. If you have labs (blood work) drawn today and your tests are completely normal, you will receive your results only by: MyChart Message (if you have MyChart) OR A paper copy in the mail If you have any lab test that is abnormal or we need to change your treatment, we will call you to review the results.  Testing/Procedures: Your physician has requested that you have a coronary calcium score performed. This is not covered by insurance and will be an out-of-pocket cost of approximately $99.   Follow-Up: At [facility], you and your health needs are our priority.  As part of our continuing mission to provide you with exceptional heart care, we have created designated Provider Care Teams.  These Care Teams include your primary Cardiologist (physician) and Advanced Practice Providers (APPs -  Physician Assistants and Nurse Practitioners) who all work together to provide you with the care you need, when you need it.  Your next appointment:   6 month(s)  The format for your next appointment:   In Person  Provider: Available Provider   Other Instructions    1st Floor: - Lobby - Registration  - Pharmacy  - Lab - Cafe  2nd Floor: - PV Lab - Diagnostic Testing (echo, CT, nuclear med)  3rd Floor: - Vacant  4th Floor: - TCTS (cardiothoracic surgery) - AFib Clinic - Structural Heart Clinic - Vascular Surgery  - Vascular Ultrasound  5th Floor: - HeartCare Cardiology (general and EP) - Clinical Pharmacy for coumadin, hypertension, lipid, weight-loss medications, and med management appointments    Valet parking services will be available as well.

## 2023-05-10 ENCOUNTER — Other Ambulatory Visit (HOSPITAL_COMMUNITY): Payer: Self-pay

## 2023-05-12 ENCOUNTER — Ambulatory Visit: Admitting: Nurse Practitioner

## 2023-05-12 ENCOUNTER — Encounter: Payer: Self-pay | Admitting: Nurse Practitioner

## 2023-05-12 VITALS — BP 128/82 | HR 72 | Ht 70.0 in | Wt 190.4 lb

## 2023-05-12 DIAGNOSIS — E781 Pure hyperglyceridemia: Secondary | ICD-10-CM | POA: Diagnosis not present

## 2023-05-12 DIAGNOSIS — I10 Essential (primary) hypertension: Secondary | ICD-10-CM

## 2023-05-12 DIAGNOSIS — K760 Fatty (change of) liver, not elsewhere classified: Secondary | ICD-10-CM

## 2023-05-12 DIAGNOSIS — R109 Unspecified abdominal pain: Secondary | ICD-10-CM | POA: Insufficient documentation

## 2023-05-12 DIAGNOSIS — Z0000 Encounter for general adult medical examination without abnormal findings: Secondary | ICD-10-CM

## 2023-05-12 DIAGNOSIS — E559 Vitamin D deficiency, unspecified: Secondary | ICD-10-CM | POA: Diagnosis not present

## 2023-05-12 DIAGNOSIS — R110 Nausea: Secondary | ICD-10-CM | POA: Insufficient documentation

## 2023-05-12 DIAGNOSIS — F418 Other specified anxiety disorders: Secondary | ICD-10-CM | POA: Diagnosis not present

## 2023-05-12 DIAGNOSIS — R140 Abdominal distension (gaseous): Secondary | ICD-10-CM | POA: Insufficient documentation

## 2023-05-12 DIAGNOSIS — R1310 Dysphagia, unspecified: Secondary | ICD-10-CM | POA: Insufficient documentation

## 2023-05-12 MED ORDER — PROPRANOLOL HCL 40 MG PO TABS: 20.0000 mg | ORAL_TABLET | Freq: Every day | ORAL | 1 refills | Status: DC

## 2023-05-12 MED ORDER — FENOFIBRATE 48 MG PO TABS: 24.0000 mg | ORAL_TABLET | Freq: Every day | ORAL | 1 refills | Status: DC

## 2023-05-12 NOTE — Progress Notes (Signed)
 Dell Fennel, DNP, AGNP-c [facility] [address] [place], [place] [address] Main Office [phone]  BP 128/82   Pulse 72   Ht 5\' 10"  (1.778 m)   Wt 190 lb 6.4 oz (86.4 kg)   BMI 27.32 kg/m    Subjective:    Patient ID: [name], male    DOB: 04/26/1975, 48 y.o.   MRN: [id number]  HPI: [name] is a 48 y.o. male presenting on 05/12/2023 for comprehensive medical examination.   History of Present Illness [name] is a 48 year old male who presents for a routine follow-up and discussion of supplements and medications.  He has been using ibuprofen  frequently and is considering adjusting his medication regimen. He brought supplements to discuss their safety, including turmeric, vitamin D , magnesium  with lignite, and a probiotic. He is particularly interested in magnesium  for headaches and sleep, and a probiotic for gut health. He has not started any of these supplements yet.  He has a history of heart issues and is awaiting a cardiac CT scoring in June to assess calcification. He has met with a cardiologist who is following up on this matter.  He is experiencing significant family stress due to his daughter's recent disclosure of a sensitive situation involving a family member. His daughter is receiving counseling, and he is trying to support his family while managing his own emotional health. He has been experiencing anxiety and is trying to manage it without letting it progress to depression. He has alprazolam  available for acute anxiety episodes but is cautious about using it regularly. He declines daily medication at this time but is aware that this is an option.   He is focusing on maintaining a healthy lifestyle through diet and exercise, incorporating superfoods and cruciferous vegetables into his diet, and has been cutting his blood pressure medication in half while monitoring his health closely.  Pertinent items are noted in HPI.   Most  Recent Depression Screen:     05/12/2023    1:39 PM 04/08/2022    8:18 AM 04/05/2021   11:49 AM 10/30/2020    9:31 AM 04/03/2020    9:42 AM  Depression screen PHQ 2/9  Decreased Interest 0 0 0 0 0  Down, Depressed, Hopeless 0 0 0 0 0  PHQ - 2 Score 0 0 0 0 0  Altered sleeping    0   Tired, decreased energy    1   Change in appetite    1   Feeling bad or failure about yourself     0   Trouble concentrating    0   Moving slowly or fidgety/restless    0   Suicidal thoughts    0   PHQ-9 Score    2   Difficult doing work/chores    Not difficult at all    Most Recent Anxiety Screen:     10/30/2020    9:32 AM  GAD 7 : Generalized Anxiety Score  Nervous, Anxious, on Edge 1  Control/stop worrying 0  Worry too much - different things 0  Trouble relaxing 0  Restless 0  Easily annoyed or irritable 1  Afraid - awful might happen 0  Total GAD 7 Score 2  Anxiety Difficulty Not difficult at all   Most Recent Fall Screen:    05/12/2023    1:38 PM 04/08/2022    8:18 AM 04/05/2021   11:49 AM 10/30/2020    9:31 AM 08/11/2016    8:49  AM  Fall Risk   Falls in the past year? 0 0 0 0 Yes  Number falls in past yr: 0 0 0 0   Injury with Fall? 0 0 0 0   Risk for fall due to : No Fall Risks No Fall Risks No Fall Risks No Fall Risks   Follow up Falls evaluation completed Falls evaluation completed Falls evaluation completed Falls evaluation completed     Past medical history, surgical history, medications, allergies, family history and social history reviewed with patient today and changes made to appropriate areas of the chart.  Past Medical History:  Past Medical History:  Diagnosis Date   Acute non-recurrent pansinusitis 03/08/2023   Allergy    Anxiety    Cervicalgia 04/17/2012   Chronic neck pain    Chronic pain    DDD (degenerative disc disease)    Herniated cervical disc    Hypertension    IBS (irritable bowel syndrome)    Lumbar pain with radiation down left leg 01/30/2014    Migraines    since 2002   Migraines    Motion sickness 03/08/2023   Nausea 10/30/2020   Renal calculi    Right upper quadrant pain 01/11/2022   Shingles    Sleep apnea    does not wear c-pap   Stressful life events affecting family and household 05/30/2019   Subacute cough 09/06/2022   Xiphoid pain 05/30/2019   Medications:  Current Outpatient Medications on File Prior to Visit  Medication Sig   Budeson-Glycopyrrol-Formoterol  (BREZTRI  AEROSPHERE) 160-9-4.8 MCG/ACT AERO Inhale 2 puffs into the lungs in the morning and at bedtime.   fluticasone  (FLONASE ) 50 MCG/ACT nasal spray Place 2 sprays into both nostrils daily.   omega-3 acid ethyl esters (LOVAZA ) 1 g capsule Take 1 capsule (1 g total) by mouth 2 (two) times daily.   ondansetron  (ZOFRAN ) 8 MG tablet Take 8 mg by mouth every 8 (eight) hours as needed for nausea or vomiting.   Rimegepant Sulfate (NURTEC) 75 MG TBDP Take 1 tablet every other day for migraine prevention.   ALPRAZolam  (XANAX ) 0.25 MG tablet Take 1 dose 30 minutes before flying. May repeat 1 time if symptoms persist. (Patient not taking: Reported on 05/12/2023)   No current facility-administered medications on file prior to visit.   Surgical History:  Past Surgical History:  Procedure Laterality Date   ANTERIOR CERVICAL DECOMP/DISCECTOMY FUSION N/A 08/15/2013   Procedure: Anterior Cervical Four-Five/Five-Six/Six-Seven Decompression and Fusion;  Surgeon: Isadora Mar, MD;  Location: [facility];  Service: Neurosurgery;  Laterality: N/A;   LITHOTRIPSY     WISDOM TOOTH EXTRACTION     Allergies:  Allergies  Allergen Reactions   Erythromycin Nausea And Vomiting   Lisinopril      Headaches, concern about kidney stones. No allergy to the medication.   Other     PECANS, WALNUTS   Family History:  Family History  Problem Relation Age of Onset   Headache Mother    Cancer Father        prostate and kidney cancer   Prostate cancer Father    Kidney cancer Father     Kidney Stones Sister    Migraines Sister        Objective:    BP 128/82   Pulse 72   Ht 5\' 10"  (1.778 m)   Wt 190 lb 6.4 oz (86.4 kg)   BMI 27.32 kg/m   Wt Readings from Last 3 Encounters:  05/12/23 190 lb 6.4 oz (86.4 kg)  04/25/23 189 lb 9.6 oz (86 kg)  03/08/23 192 lb (87.1 kg)    Physical Exam Vitals and nursing note reviewed.  Constitutional:      Appearance: Normal appearance.  HENT:     Head: Normocephalic and atraumatic.     Right Ear: Hearing, tympanic membrane, ear canal and external ear normal.     Left Ear: Hearing, tympanic membrane, ear canal and external ear normal.     Nose: Nose normal.     Right Sinus: No maxillary sinus tenderness or frontal sinus tenderness.     Left Sinus: No maxillary sinus tenderness or frontal sinus tenderness.     Mouth/Throat:     Lips: Pink.     Mouth: Mucous membranes are moist.     Pharynx: Oropharynx is clear.  Eyes:     General: Lids are normal. Vision grossly intact.     Extraocular Movements: Extraocular movements intact.     Pupils: Pupils are equal, round, and reactive to light.     Funduscopic exam:    Right eye: No hemorrhage. Red reflex present.        Left eye: No hemorrhage. Red reflex present.    Visual Fields: Right eye visual fields normal and left eye visual fields normal.  Neck:     Thyroid: No thyromegaly.     Vascular: No carotid bruit or JVD.  Cardiovascular:     Rate and Rhythm: Normal rate and regular rhythm.     Chest Wall: PMI is not displaced.     Pulses: Normal pulses.          Dorsalis pedis pulses are 2+ on the right side and 2+ on the left side.       Posterior tibial pulses are 2+ on the right side and 2+ on the left side.     Heart sounds: Normal heart sounds. No murmur heard. Pulmonary:     Effort: Pulmonary effort is normal. No respiratory distress.     Breath sounds: Normal breath sounds.  Chest:  Breasts:    Breasts are symmetrical.  Abdominal:     General: Bowel sounds are  normal. There is no distension or abdominal bruit.     Palpations: Abdomen is soft. There is no hepatomegaly, splenomegaly or mass.     Tenderness: There is no abdominal tenderness. There is no right CVA tenderness, left CVA tenderness, guarding or rebound.  Musculoskeletal:        General: Normal range of motion.     Cervical back: Full passive range of motion without pain and neck supple. No tenderness. No spinous process tenderness or muscular tenderness.     Right lower leg: No edema.     Left lower leg: No edema.  Feet:     Right foot:     Toenail Condition: Right toenails are normal.     Left foot:     Toenail Condition: Left toenails are normal.  Lymphadenopathy:     Cervical: No cervical adenopathy.     Upper Body:     Right upper body: No supraclavicular adenopathy.     Left upper body: No supraclavicular adenopathy.  Skin:    General: Skin is warm and dry.     Capillary Refill: Capillary refill takes less than 2 seconds.     Nails: There is no clubbing.  Neurological:     General: No focal deficit present.     Mental Status: He is alert and oriented to person, place, and time.  Cranial Nerves: No cranial nerve deficit.     Sensory: Sensation is intact. No sensory deficit.     Motor: Motor function is intact. No weakness.     Coordination: Coordination is intact. Coordination normal.     Gait: Gait is intact. Gait normal.  Psychiatric:        Attention and Perception: Attention normal.        Mood and Affect: Mood normal. Affect is tearful.        Speech: Speech normal.        Behavior: Behavior normal. Behavior is cooperative.        Cognition and Memory: Cognition and memory normal.      Results for orders placed or performed in visit on 05/12/23  CBC with Differential/Platelet   Collection Time: 05/12/23  2:43 PM  Result Value Ref Range   WBC 6.0 3.4 - 10.8 x10E3/uL   RBC 5.41 4.14 - 5.80 x10E6/uL   Hemoglobin 16.1 13.0 - 17.7 g/dL   Hematocrit 16.1 09.6  - 51.0 %   MCV 86 79 - 97 fL   MCH 29.8 26.6 - 33.0 pg   MCHC 34.5 31.5 - 35.7 g/dL   RDW 04.5 40.9 - 81.1 %   Platelets 344 150 - 450 x10E3/uL   Neutrophils 54 Not Estab. %   Lymphs 36 Not Estab. %   Monocytes 7 Not Estab. %   Eos 1 Not Estab. %   Basos 1 Not Estab. %   Neutrophils Absolute 3.3 1.4 - 7.0 x10E3/uL   Lymphocytes Absolute 2.2 0.7 - 3.1 x10E3/uL   Monocytes Absolute 0.4 0.1 - 0.9 x10E3/uL   EOS (ABSOLUTE) 0.1 0.0 - 0.4 x10E3/uL   Basophils Absolute 0.1 0.0 - 0.2 x10E3/uL   Immature Granulocytes 1 Not Estab. %   Immature Grans (Abs) 0.0 0.0 - 0.1 x10E3/uL  CMP14+EGFR   Collection Time: 05/12/23  2:43 PM  Result Value Ref Range   Glucose 84 70 - 99 mg/dL   BUN 15 6 - 24 mg/dL   Creatinine, Ser 9.14 0.76 - 1.27 mg/dL   eGFR 782 >95 AO/ZHY/8.65   BUN/Creatinine Ratio 17 9 - 20   Sodium 138 134 - 144 mmol/L   Potassium 4.2 3.5 - 5.2 mmol/L   Chloride 99 96 - 106 mmol/L   CO2 21 20 - 29 mmol/L   Calcium  10.0 8.7 - 10.2 mg/dL   Total Protein 7.9 6.0 - 8.5 g/dL   Albumin 4.8 4.1 - 5.1 g/dL   Globulin, Total 3.1 1.5 - 4.5 g/dL   Bilirubin Total 0.6 0.0 - 1.2 mg/dL   Alkaline Phosphatase 80 44 - 121 IU/L   AST 17 0 - 40 IU/L   ALT 17 0 - 44 IU/L  Hemoglobin A1c   Collection Time: 05/12/23  2:43 PM  Result Value Ref Range   Hgb A1c MFr Bld 5.4 4.8 - 5.6 %   Est. average glucose Bld gHb Est-mCnc 108 mg/dL  Lipid panel   Collection Time: 05/12/23  2:43 PM  Result Value Ref Range   Cholesterol, Total 229 (H) 100 - 199 mg/dL   Triglycerides 784 (H) 0 - 149 mg/dL   HDL 30 (L) >69 mg/dL   VLDL Cholesterol Cal 75 (H) 5 - 40 mg/dL   LDL Chol Calc (NIH) 629 (H) 0 - 99 mg/dL   Chol/HDL Ratio 7.6 (H) 0.0 - 5.0 ratio  VITAMIN D  25 Hydroxy (Vit-D Deficiency, Fractures)   Collection Time: 05/12/23  2:43 PM  Result Value Ref Range   Vit D, 25-Hydroxy 22.9 (L) 30.0 - 100.0 ng/mL       Assessment & Plan:   Problem List Items Addressed This Visit     Essential  hypertension   Hypertension is managed with lifestyle modifications and medication. He has been halving his blood pressure medication and compensating with diet and exercise. He is incorporating dietary changes to manage cholesterol and inflammation. - Continue current blood pressure medication regimen - Monitor blood pressure regularly      Relevant Medications   fenofibrate  (TRICOR ) 48 MG tablet   propranolol  (INDERAL ) 40 MG tablet   Situational anxiety   Anxiety related to recent family stressors, including a traumatic family event. He uses alprazolam  as needed for acute anxiety episodes. Discussed the importance of managing anxiety to prevent progression to depression. Encouraged open communication and seeking support when needed. - Use alprazolam  as needed for acute anxiety - Consider additional support or medication if anxiety becomes overwhelming      Encounter for annual physical exam   Routine wellness visit. Discussed dietary habits, including the incorporation of superfoods like blueberries, turmeric, broccoli, and artichokes. Discussed the use of supplements such as vitamin D , magnesium , and probiotics. He is incorporating lifestyle changes to manage health, including exercise and diet modifications. Approved the use of vitamin D , magnesium , and probiotics as safe and beneficial for overall health. - Continue current dietary habits and exercise regimen - Approve use of vitamin D , magnesium , and probiotics      Hypertriglyceridemia   Repeat labs today for evaluation. No changes in medications at this time.       Relevant Medications   fenofibrate  (TRICOR ) 48 MG tablet   propranolol  (INDERAL ) 40 MG tablet   Other Relevant Orders   Lipid panel (Completed)   Vitamin D  deficiency - Primary   Repeat labs. Recommend continued supplementation for optimal control      Relevant Orders   VITAMIN D  25 Hydroxy (Vit-D Deficiency, Fractures) (Completed)   Fatty liver   Working on  diet and exercise for weight management with good results. Continue with current plan.       Relevant Orders   CBC with Differential/Platelet (Completed)   CMP14+EGFR (Completed)   Hemoglobin A1c (Completed)      Follow up plan: Return in about 1 year (around 05/11/2024) for CPE.  NEXT PREVENTATIVE PHYSICAL DUE IN 1 YEAR.  PATIENT COUNSELING PROVIDED FOR ALL ADULT PATIENTS: A well balanced diet low in saturated fats, cholesterol, and moderation in carbohydrates.  This can be as simple as monitoring portion sizes and cutting back on sugary beverages such as soda and juice to start with.    Daily water consumption of at least 64 ounces.  Physical activity at least 180 minutes per week.  If just starting out, start 10 minutes a day and work your way up.   This can be as simple as taking the stairs instead of the elevator and walking 2-3 laps around the office  purposefully every day.   STD protection, partner selection, and regular testing if high risk.  Limited consumption of alcoholic beverages if alcohol is consumed. For men, I recommend no more than 14 alcoholic beverages per week, spread out throughout the week (max 2 per day). Avoid "binge" drinking or consuming large quantities of alcohol in one setting.  Please let me know if you feel you may need help with reduction or quitting alcohol consumption.   Avoidance of nicotine, if used. Please let  me know if you feel you may need help with reduction or quitting nicotine use.   Daily mental health attention. This can be in the form of 5 minute daily meditation, prayer, journaling, yoga, reflection, etc.  Purposeful attention to your emotions and mental state can significantly improve your overall wellbeing  and  Health.  Please know that I am here to help you with all of your health care goals and am happy to work with you to find a solution that works best for you.  The greatest advice I have received with any changes in life  are to take it one step at a time, that even means if all you can focus on is the next 60 seconds, then do that and celebrate your victories.  With any changes in life, you will have set backs, and that is OK. The important thing to remember is, if you have a set back, it is not a failure, it is an opportunity to try again! Screening Testing Mammogram Every 1 -2 years based on history and risk factors Starting at age 71 Pap Smear Ages 21-39 every 3 years Ages 59-65 every 5 years with HPV testing More frequent testing may be required based on results and history Colon Cancer Screening Every 1-10 years based on test performed, risk factors, and history Starting at age 26 Bone Density Screening Every 2-10 years based on history Starting at age 43 for women Recommendations for men differ based on medication usage, history, and risk factors AAA Screening One time ultrasound Men 79-50 years old who have every smoked Lung Cancer Screening Low Dose Lung CT every 12 months Age 22-80 years with a 30 pack-year smoking history who still smoke or who have quit within the last 15 years   Screening Labs Routine  Labs: Complete Blood Count (CBC), Complete Metabolic Panel (CMP), Cholesterol (Lipid Panel) Every 6-12 months based on history and medications May be recommended more frequently based on current conditions or previous results Hemoglobin A1c Lab Every 3-12 months based on history and previous results Starting at age 51 or earlier with diagnosis of diabetes, high cholesterol, BMI >26, and/or risk factors Frequent monitoring for patients with diabetes to ensure blood sugar control Thyroid Panel (TSH) Every 6 months based on history, symptoms, and risk factors May be repeated more often if on medication HIV One time testing for all patients 67 and older May be repeated more frequently for patients with increased risk factors or exposure Hepatitis C One time testing for all patients 58  and older May be repeated more frequently for patients with increased risk factors or exposure Gonorrhea, Chlamydia Every 12 months for all sexually active persons 13-24 years Additional monitoring may be recommended for those who are considered high risk or who have symptoms Every 12 months for any woman on birth control, regardless of sexual activity PSA Men 47-44 years old with risk factors Additional screening may be recommended from age 25-69 based on risk factors, symptoms, and history  Vaccine Recommendations Tetanus Booster All adults every 10 years Flu Vaccine All patients 6 months and older every year COVID Vaccine All patients 12 years and older Initial dosing with booster May recommend additional booster based on age and health history HPV Vaccine 2 doses all patients age 48-26 Dosing may be considered for patients over 26 Shingles Vaccine (Shingrix) 2 doses all adults 55 years and older Pneumonia (Pneumovax 23) All adults 65 years and older May recommend earlier dosing based on health history  One year apart from Prevnar 13 Pneumonia (Prevnar 29) All adults 65 years and older Dosed 1 year after Pneumovax 23 Pneumonia (Prevnar 20) One time alternative to the two dosing of 13 and 23 For all adults with initial dose of 23, 20 is recommended 1 year later For all adults with initial dose of 13, 23 is still recommended as second option 1 year later

## 2023-05-12 NOTE — Patient Instructions (Addendum)
 Use the alprazolam  as you need it for anxiety and panic.   I will let you know if there is anything on the labs that is concerning.   I have sent in medication refills for you. Let me know if you need anything else.

## 2023-05-13 LAB — CMP14+EGFR
ALT: 17 IU/L (ref 0–44)
AST: 17 IU/L (ref 0–40)
Albumin: 4.8 g/dL (ref 4.1–5.1)
Alkaline Phosphatase: 80 IU/L (ref 44–121)
BUN/Creatinine Ratio: 17 (ref 9–20)
BUN: 15 mg/dL (ref 6–24)
Bilirubin Total: 0.6 mg/dL (ref 0.0–1.2)
CO2: 21 mmol/L (ref 20–29)
Calcium: 10 mg/dL (ref 8.7–10.2)
Chloride: 99 mmol/L (ref 96–106)
Creatinine, Ser: 0.87 mg/dL (ref 0.76–1.27)
Globulin, Total: 3.1 g/dL (ref 1.5–4.5)
Glucose: 84 mg/dL (ref 70–99)
Potassium: 4.2 mmol/L (ref 3.5–5.2)
Sodium: 138 mmol/L (ref 134–144)
Total Protein: 7.9 g/dL (ref 6.0–8.5)
eGFR: 106 mL/min/{1.73_m2} (ref >59)

## 2023-05-13 LAB — CBC WITH DIFFERENTIAL/PLATELET
Basophils Absolute: 0.1 10*3/uL (ref 0.0–0.2)
Basos: 1 %
EOS (ABSOLUTE): 0.1 10*3/uL (ref 0.0–0.4)
Eos: 1 %
Hematocrit: 46.6 % (ref 37.5–51.0)
Hemoglobin: 16.1 g/dL (ref 13.0–17.7)
Immature Grans (Abs): 0 10*3/uL (ref 0.0–0.1)
Immature Granulocytes: 1 %
Lymphocytes Absolute: 2.2 10*3/uL (ref 0.7–3.1)
Lymphs: 36 %
MCH: 29.8 pg (ref 26.6–33.0)
MCHC: 34.5 g/dL (ref 31.5–35.7)
MCV: 86 fL (ref 79–97)
Monocytes Absolute: 0.4 10*3/uL (ref 0.1–0.9)
Monocytes: 7 %
Neutrophils Absolute: 3.3 10*3/uL (ref 1.4–7.0)
Neutrophils: 54 %
Platelets: 344 10*3/uL (ref 150–450)
RBC: 5.41 x10E6/uL (ref 4.14–5.80)
RDW: 12.5 % (ref 11.6–15.4)
WBC: 6 10*3/uL (ref 3.4–10.8)

## 2023-05-13 LAB — LIPID PANEL
Chol/HDL Ratio: 7.6 ratio — ABNORMAL HIGH (ref 0.0–5.0)
Cholesterol, Total: 229 mg/dL — ABNORMAL HIGH (ref 100–199)
HDL: 30 mg/dL — ABNORMAL LOW (ref >39)
LDL Chol Calc (NIH): 124 mg/dL — ABNORMAL HIGH (ref 0–99)
Triglycerides: 421 mg/dL — ABNORMAL HIGH (ref 0–149)
VLDL Cholesterol Cal: 75 mg/dL — ABNORMAL HIGH (ref 5–40)

## 2023-05-13 LAB — HEMOGLOBIN A1C
Est. average glucose Bld gHb Est-mCnc: 108 mg/dL
Hgb A1c MFr Bld: 5.4 % (ref 4.8–5.6)

## 2023-05-13 LAB — VITAMIN D 25 HYDROXY (VIT D DEFICIENCY, FRACTURES): Vit D, 25-Hydroxy: 22.9 ng/mL — ABNORMAL LOW (ref 30.0–100.0)

## 2023-05-15 ENCOUNTER — Encounter: Payer: Self-pay | Admitting: Nurse Practitioner

## 2023-05-16 ENCOUNTER — Encounter: Payer: Self-pay | Admitting: Nurse Practitioner

## 2023-05-16 NOTE — Assessment & Plan Note (Signed)
 Working on diet and exercise for weight management with good results. Continue with current plan.

## 2023-05-16 NOTE — Assessment & Plan Note (Signed)
 Repeat labs. Recommend continued supplementation for optimal control

## 2023-05-16 NOTE — Assessment & Plan Note (Signed)
 Anxiety related to recent family stressors, including a traumatic family event. He uses alprazolam  as needed for acute anxiety episodes. Discussed the importance of managing anxiety to prevent progression to depression. Encouraged open communication and seeking support when needed. - Use alprazolam  as needed for acute anxiety - Consider additional support or medication if anxiety becomes overwhelming

## 2023-05-16 NOTE — Assessment & Plan Note (Signed)
 Repeat labs today for evaluation. No changes in medications at this time.

## 2023-05-16 NOTE — Assessment & Plan Note (Signed)
 Routine wellness visit. Discussed dietary habits, including the incorporation of superfoods like blueberries, turmeric, broccoli, and artichokes. Discussed the use of supplements such as vitamin D , magnesium , and probiotics. He is incorporating lifestyle changes to manage health, including exercise and diet modifications. Approved the use of vitamin D , magnesium , and probiotics as safe and beneficial for overall health. - Continue current dietary habits and exercise regimen - Approve use of vitamin D , magnesium , and probiotics

## 2023-05-16 NOTE — Assessment & Plan Note (Signed)
 Hypertension is managed with lifestyle modifications and medication. He has been halving his blood pressure medication and compensating with diet and exercise. He is incorporating dietary changes to manage cholesterol and inflammation. - Continue current blood pressure medication regimen - Monitor blood pressure regularly

## 2023-05-17 ENCOUNTER — Encounter: Payer: Self-pay | Admitting: Nurse Practitioner

## 2023-06-11 ENCOUNTER — Ambulatory Visit
Admission: EM | Admit: 2023-06-11 | Discharge: 2023-06-11 | Disposition: A | Discharge status: Home / Self Care | Attending: Family Medicine | Admitting: Family Medicine

## 2023-06-11 DIAGNOSIS — G500 Trigeminal neuralgia: Secondary | ICD-10-CM | POA: Diagnosis not present

## 2023-06-11 MED ORDER — GABAPENTIN 100 MG PO CAPS: 100.0000 mg | ORAL_CAPSULE | Freq: Three times a day (TID) | ORAL | 0 refills | Status: DC

## 2023-06-11 MED ORDER — CARBAMAZEPINE ER 100 MG PO CP12: 100.0000 mg | ORAL_CAPSULE | Freq: Two times a day (BID) | ORAL | 0 refills | Status: DC

## 2023-06-11 NOTE — ED Provider Notes (Signed)
 [facility]  Note:  This document was prepared using Conservation officer, historic buildings and may include unintentional dictation errors.  MRN: [id number] DOB: 17-May-1975  Subjective:   [name] is a 48 y.o. male presenting for 2-week history of persistent right-sided facial pain that radiates into the ear and down his right jaw.  Has been worked up by his Sports coach and had negative workup for a dental source of his symptoms.  He has been undergoing a course of amoxicillin  and prednisone  with tramadol with minimal relief.  He is still taking all these medications.  Was advised by his dental specialist to seek a recheck with us .  No fever, ear pain, ear drainage, dizziness, tinnitus, sinus symptoms, sore throat, cough, chest pain, shortness of breath, wheezing, nausea, vomiting, abdominal pain, rashes.  Over the years, he has had similar flareups but is never seen a neurologist for this.  He also has a history of chronic migraines.  No migraines at the moment.  No current facility-administered medications for this encounter.  Current Outpatient Medications:    ALPRAZolam  (XANAX ) 0.25 MG tablet, Take 1 dose 30 minutes before flying. May repeat 1 time if symptoms persist. (Patient not taking: Reported on 05/12/2023), Disp: 10 tablet, Rfl: 0   Budeson-Glycopyrrol-Formoterol  (BREZTRI  AEROSPHERE) 160-9-4.8 MCG/ACT AERO, Inhale 2 puffs into the lungs in the morning and at bedtime., Disp: 21.4 g, Rfl: 1   fenofibrate  (TRICOR ) 48 MG tablet, Take 0.5 tablets (24 mg total) by mouth daily., Disp: 90 tablet, Rfl: 1   fluticasone  (FLONASE ) 50 MCG/ACT nasal spray, Place 2 sprays into both nostrils daily., Disp: 16 g, Rfl: 0   omega-3 acid ethyl esters (LOVAZA ) 1 g capsule, Take 1 capsule (1 g total) by mouth 2 (two) times daily., Disp: 60 capsule, Rfl: 5   ondansetron  (ZOFRAN ) 8 MG tablet, Take 8 mg by mouth every 8 (eight) hours as needed for nausea or vomiting., Disp: , Rfl:     propranolol  (INDERAL ) 40 MG tablet, Take 0.5 tablets (20 mg total) by mouth daily., Disp: 90 tablet, Rfl: 1   Rimegepant Sulfate (NURTEC) 75 MG TBDP, Take 1 tablet every other day for migraine prevention., Disp: 30 tablet, Rfl: 3   Allergies  Allergen Reactions   Erythromycin Nausea And Vomiting   Lisinopril      Headaches, concern about kidney stones. No allergy to the medication.   Other     PECANS, WALNUTS    Past Medical History:  Diagnosis Date   Acute non-recurrent pansinusitis 03/08/2023   Allergy    Anxiety    Cervicalgia 04/17/2012   Chronic neck pain    Chronic pain    DDD (degenerative disc disease)    Herniated cervical disc    Hypertension    IBS (irritable bowel syndrome)    Lumbar pain with radiation down left leg 01/30/2014   Migraines    since 2002   Migraines    Motion sickness 03/08/2023   Nausea 10/30/2020   Renal calculi    Right upper quadrant pain 01/11/2022   Shingles    Sleep apnea    does not wear c-pap   Stressful life events affecting family and household 05/30/2019   Subacute cough 09/06/2022   Xiphoid pain 05/30/2019     Past Surgical History:  Procedure Laterality Date   ANTERIOR CERVICAL DECOMP/DISCECTOMY FUSION N/A 08/15/2013   Procedure: Anterior Cervical Four-Five/Five-Six/Six-Seven Decompression and Fusion;  Surgeon: Isadora Mar, MD;  Location: [facility];  Service: Neurosurgery;  Laterality: N/A;   LITHOTRIPSY     WISDOM TOOTH EXTRACTION      Family History  Problem Relation Age of Onset   Headache Mother    Cancer Father        prostate and kidney cancer   Prostate cancer Father    Kidney cancer Father    Kidney Stones Sister    Migraines Sister     Social History   Tobacco Use   Smoking status: Never   Smokeless tobacco: Never  Vaping Use   Vaping status: Never Used  Substance Use Topics   Alcohol use: No   Drug use: No    ROS   Objective:   Vitals: BP 107/72 (BP Location: Left Arm)   Pulse 68    Temp 97.8 F (36.6 C) (Oral)   Resp 18   SpO2 94%   Physical Exam Constitutional:      General: He is not in acute distress.    Appearance: Normal appearance. He is well-developed and normal weight. He is not ill-appearing, toxic-appearing or diaphoretic.  HENT:     Head: Normocephalic and atraumatic.      Right Ear: Tympanic membrane, ear canal and external ear normal. No drainage, swelling or tenderness. No middle ear effusion. There is no impacted cerumen. Tympanic membrane is not erythematous or bulging.     Left Ear: Tympanic membrane, ear canal and external ear normal. No drainage, swelling or tenderness.  No middle ear effusion. There is no impacted cerumen. Tympanic membrane is not erythematous or bulging.     Nose: Nose normal. No congestion or rhinorrhea.     Mouth/Throat:     Mouth: Mucous membranes are moist.     Pharynx: Oropharynx is clear. No oropharyngeal exudate or posterior oropharyngeal erythema.  Eyes:     General: No scleral icterus.       Right eye: No discharge.        Left eye: No discharge.     Extraocular Movements: Extraocular movements intact.     Conjunctiva/sclera: Conjunctivae normal.  Cardiovascular:     Rate and Rhythm: Normal rate.  Pulmonary:     Effort: Pulmonary effort is normal.  Musculoskeletal:     Cervical back: Normal range of motion and neck supple. No rigidity. No muscular tenderness.  Neurological:     General: No focal deficit present.     Mental Status: He is alert and oriented to person, place, and time.     Cranial Nerves: No cranial nerve deficit.     Motor: No weakness.     Coordination: Coordination normal.     Gait: Gait normal.  Psychiatric:        Mood and Affect: Mood normal.        Behavior: Behavior normal.        Thought Content: Thought content normal.        Judgment: Judgment normal.     Assessment and Plan :   PDMP not reviewed this encounter.  1. Trigeminal neuralgia of right side of face    I do not  suspect shingles.  High suspicion for trigeminal neuralgia.  Recommend starting carbamazepine, gabapentin.  Placed a referral to neurology both for his trigeminal neuralgia but also his migraine headaches.  Counseled patient on potential for adverse effects with medications prescribed/recommended today, ER and return-to-clinic precautions discussed, patient verbalized understanding.    Adolph Hoop, [place] 06/11/23 1423

## 2023-06-11 NOTE — ED Triage Notes (Signed)
 Pt c/o right side face pain x 1 week-with hx of same-was seen by endodontist x 2 this week/last being today and was seen by dentist-started taking amoxil , prednisone  and tramadol from this week-NAD-steady gait

## 2023-06-13 ENCOUNTER — Encounter: Payer: Self-pay | Admitting: Neurology

## 2023-06-13 ENCOUNTER — Encounter: Payer: Self-pay | Admitting: Nurse Practitioner

## 2023-06-15 ENCOUNTER — Encounter: Payer: Self-pay | Admitting: Neurology

## 2023-06-15 ENCOUNTER — Ambulatory Visit (INDEPENDENT_AMBULATORY_CARE_PROVIDER_SITE_OTHER): Admitting: Neurology

## 2023-06-15 ENCOUNTER — Other Ambulatory Visit (HOSPITAL_BASED_OUTPATIENT_CLINIC_OR_DEPARTMENT_OTHER): Payer: Self-pay

## 2023-06-15 ENCOUNTER — Telehealth: Payer: Self-pay | Admitting: Neurology

## 2023-06-15 VITALS — BP 126/77 | HR 76 | Ht 70.0 in | Wt 190.2 lb

## 2023-06-15 DIAGNOSIS — G500 Trigeminal neuralgia: Secondary | ICD-10-CM

## 2023-06-15 DIAGNOSIS — R1310 Dysphagia, unspecified: Secondary | ICD-10-CM

## 2023-06-15 DIAGNOSIS — G509 Disorder of trigeminal nerve, unspecified: Secondary | ICD-10-CM

## 2023-06-15 DIAGNOSIS — C479 Malignant neoplasm of peripheral nerves and autonomic nervous system, unspecified: Secondary | ICD-10-CM

## 2023-06-15 DIAGNOSIS — M2420 Disorder of ligament, unspecified site: Secondary | ICD-10-CM | POA: Diagnosis not present

## 2023-06-15 MED ORDER — BACLOFEN 10 MG PO TABS
10.0000 mg | ORAL_TABLET | Freq: Three times a day (TID) | ORAL | 3 refills | Status: DC | PRN
  Filled 2023-06-15 (×2): qty 90, 30d supply, fill #0

## 2023-06-15 MED ORDER — CARBAMAZEPINE ER 200 MG PO CP12
200.0000 mg | ORAL_CAPSULE | Freq: Two times a day (BID) | ORAL | 6 refills | Status: DC
  Filled 2023-06-15: qty 60, 30d supply, fill #0

## 2023-06-15 MED ORDER — GABAPENTIN 300 MG PO CAPS
300.0000 mg | ORAL_CAPSULE | Freq: Three times a day (TID) | ORAL | 11 refills | Status: DC
  Filled 2023-06-15: qty 90, 30d supply, fill #0

## 2023-06-15 NOTE — Patient Instructions (Addendum)
 MRI trigeminal nerve CT neck   Increase carbatrol to 200mg  twice a day. Then in a few days if tolerated and needed can increase to 300mg  twice a day Increase Gabapentin 300mg  three times a day as needed Baclofen at bedtime I will give FMLA or short-term disability  If needs percocet Dr Tresia Fruit will prescribe or tramadol  Baclofen Tablets What is this medication? BACLOFEN (BAK loe fen) treats muscle spasms. It works by relaxing your muscles, which reduces muscle stiffness. It belongs to a group of medications called muscle relaxants. This medicine may be used for other purposes; ask your health care provider or pharmacist if you have questions. COMMON BRAND NAME(S): ED Baclofen, Lioresal What should I tell my care team before I take this medication? They need to know if you have any of these conditions: High blood pressure History of stroke Kidney disease Mental health conditions Ovarian cysts Seizures An unusual or allergic reaction to baclofen, other medications, foods, dyes, or preservatives Pregnant or trying to get pregnant Breastfeeding How should I use this medication? Take this medication by mouth. Take it as directed on the prescription label. Keep taking it unless your care team tells you to stop. Stopping it too quickly can cause serious side effects. It can also make your condition worse. Talk to your care team about the use of this medication in children. While it may be prescribed for children as young as 12 years for selected conditions, precautions do apply. Overdosage: If you think you have taken too much of this medicine contact a poison control center or emergency room at once. NOTE: This medicine is only for you. Do not share this medicine with others. What if I miss a dose? If you miss a dose, take it as soon as you can. If it is almost time for your next dose, take only that dose. Do not take double or extra doses. What may interact with this medication? Do not  take this medication with any of the following: Opioid medications for cough This medication may also interact with the following: Alcohol Antihistamines for allergy, cough, and cold Certain medications for anxiety or sleep Certain medications for depression, such as amitriptyline, fluoxetine, or sertraline Certain medications for seizures, such as phenobarbital or primidone General anesthetics, such as halothane, isoflurane, methoxyflurane, or propofol  Opioid medications for pain Other medications that relax muscles Phenothiazines, such as chlorpromazine, mesoridazine, prochlorperazine , thioridazine This list may not describe all possible interactions. Give your health care provider a list of all the medicines, herbs, non-prescription drugs, or dietary supplements you use. Also tell them if you smoke, drink alcohol, or use illegal drugs. Some items may interact with your medicine. What should I watch for while using this medication? Visit your care team for regular checks on your progress. Tell your care team if your symptoms do not start to get better or if they get worse. Do not suddenly stop taking this medication. You may develop a severe reaction. Your care team will tell you how much medication to take. If your care team wants you to stop the medication, the dose may be slowly lowered over time to avoid any side effects. This medication may affect your coordination, reaction time, or judgment. Do not drive or operate machinery until you know how this medication affects you. Sit up or stand slowly to reduce the risk of dizzy or fainting spells. Drinking alcohol with this medication can increase the risk of these side effects. If you take other medications that also  cause drowsiness such as other opioid pain medications, benzodiazepines, or other medications for sleep, you may have more side effects. Give your care team a list of all medications you use. They will tell you how much medication  to take. Do not take more medication than directed. Get emergency help right away if you have trouble breathing or are unusually tired or sleepy. What side effects may I notice from receiving this medication? Side effects that you should report to your care team as soon as possible: Allergic reactions--skin rash, itching, hives, swelling of the face, lips, tongue, or throat CNS depression--slow or shallow breathing, shortness of breath, feeling faint, dizziness, confusion, trouble staying awake Side effects that usually do not require medical attention (report to your care team if they continue or are bothersome): Dizziness Drowsiness Headache Muscle weakness Nausea This list may not describe all possible side effects. Call your doctor for medical advice about side effects. You may report side effects to FDA at 1-800-FDA-1088. Where should I keep my medication? Keep out of the reach of children and pets. Store at room temperature between 20 and 25 degrees C (68 and 77 degrees F). Keep container tightly closed. Get rid of any unused medication after the expiration date. To get rid of medications that are no longer needed or have expired: Take the medication to a medication take-back program. Check with your pharmacy or law enforcement to find a location. If you cannot return the medication, check the label or package insert to see if the medication should be thrown out in the garbage or flushed down the toilet. If you are not sure, ask your care team. If it is safe to put it in the trash, take the medication out of the container. Mix the medication with cat litter, dirt, coffee grounds, or other unwanted substance. Seal the mixture in a bag or container. Put it in the trash. NOTE: This sheet is a summary. It may not cover all possible information. If you have questions about this medicine, talk to your doctor, pharmacist, or health care provider.  2024 Elsevier/Gold Standard (2021-07-26  00:00:00)Gabapentin Capsules or Tablets What is this medication? GABAPENTIN (GA ba pen tin) treats nerve pain. It may also be used to prevent and control seizures in people with epilepsy. It works by calming overactive nerves in your body. This medicine may be used for other purposes; ask your health care provider or pharmacist if you have questions. COMMON BRAND NAME(S): Active-PAC with Gabapentin, Gabarone, Gralise, Neurontin What should I tell my care team before I take this medication? They need to know if you have any of these conditions: Kidney disease Lung or breathing disease Substance use disorder Suicidal thoughts, plans, or attempt by you or a family member An unusual or allergic reaction to gabapentin, other medications, foods, dyes, or preservatives Pregnant or trying to get pregnant Breastfeeding How should I use this medication? Take this medication by mouth with a glass of water. Follow the directions on the prescription label. You can take it with or without food. If it upsets your stomach, take it with food. Take your medication at regular intervals. Do not take it more often than directed. Do not stop taking except on your care team's advice. If you are directed to break the 600 or 800 mg tablets in half as part of your dose, the extra half tablet should be used for the next dose. If you have not used the extra half tablet within 28 days, it should be  thrown away. A special MedGuide will be given to you by the pharmacist with each prescription and refill. Be sure to read this information carefully each time. Talk to your care team about the use of this medication in children. While this medication may be prescribed for children as young as 3 years for selected conditions, precautions do apply. Overdosage: If you think you have taken too much of this medicine contact a poison control center or emergency room at once. NOTE: This medicine is only for you. Do not share this  medicine with others. What if I miss a dose? If you miss a dose, take it as soon as you can. If it is almost time for your next dose, take only that dose. Do not take double or extra doses. What may interact with this medication? Alcohol Antihistamines for allergy, cough, and cold Certain medications for anxiety or sleep Certain medications for depression like amitriptyline, fluoxetine, sertraline Certain medications for seizures like phenobarbital, primidone Certain medications for stomach problems General anesthetics like halothane, isoflurane, methoxyflurane, propofol  Local anesthetics like lidocaine , pramoxine, tetracaine Medications that relax muscles for surgery Opioid medications for pain Phenothiazines like chlorpromazine, mesoridazine, prochlorperazine , thioridazine This list may not describe all possible interactions. Give your health care provider a list of all the medicines, herbs, non-prescription drugs, or dietary supplements you use. Also tell them if you smoke, drink alcohol, or use illegal drugs. Some items may interact with your medicine. What should I watch for while using this medication? Visit your care team for regular checks on your progress. You may want to keep a record at home of how you feel your condition is responding to treatment. You may want to share this information with your care team at each visit. You should contact your care team if your seizures get worse or if you have any new types of seizures. Do not stop taking this medication or any of your seizure medications unless instructed by your care team. Stopping your medication suddenly can increase your seizures or their severity. This medication may cause serious skin reactions. They can happen weeks to months after starting the medication. Contact your care team right away if you notice fevers or flu-like symptoms with a rash. The rash may be red or purple and then turn into blisters or peeling of the skin.  Or, you might notice a red rash with swelling of the face, lips or lymph nodes in your neck or under your arms. Wear a medical identification bracelet or chain if you are taking this medication for seizures. Carry a card that lists all your medications. This medication may affect your coordination, reaction time, or judgment. Do not drive or operate machinery until you know how this medication affects you. Sit up or stand slowly to reduce the risk of dizzy or fainting spells. Drinking alcohol with this medication can increase the risk of these side effects. Your mouth may get dry. Chewing sugarless gum or sucking hard candy, and drinking plenty of water may help. Watch for new or worsening thoughts of suicide or depression. This includes sudden changes in mood, behaviors, or thoughts. These changes can happen at any time but are more common in the beginning of treatment or after a change in dose. Call your care team right away if you experience these thoughts or worsening depression. If you become pregnant while using this medication, you may enroll in the [place] [employer] Antiepileptic Drug Pregnancy Registry by calling [phone]. This registry collects information about the safety  of antiepileptic medication use during pregnancy. What side effects may I notice from receiving this medication? Side effects that you should report to your care team as soon as possible: Allergic reactions or angioedema--skin rash, itching, hives, swelling of the face, eyes, lips, tongue, arms, or legs, trouble swallowing or breathing Rash, fever, and swollen lymph nodes Thoughts of suicide or self harm, worsening mood, feelings of depression Trouble breathing Unusual changes in mood or behavior in children after use such as difficulty concentrating, hostility, or restlessness Side effects that usually do not require medical attention (report to your care team if they continue or are  bothersome): Dizziness Drowsiness Nausea Swelling of ankles, feet, or hands Vomiting This list may not describe all possible side effects. Call your doctor for medical advice about side effects. You may report side effects to FDA at 1-800-FDA-1088. Where should I keep my medication? Keep out of reach of children and pets. Store at room temperature between 15 and 30 degrees C (59 and 86 degrees F). Get rid of any unused medication after the expiration date. This medication may cause accidental overdose and death if taken by other adults, children, or pets. To get rid of medications that are no longer needed or have expired: Take the medication to a medication take-back program. Check with your pharmacy or law enforcement to find a location. If you cannot return the medication, check the label or package insert to see if the medication should be thrown out in the garbage or flushed down the toilet. If you are not sure, ask your care team. If it is safe to put it in the trash, empty the medication out of the container. Mix the medication with cat litter, dirt, coffee grounds, or other unwanted substance. Seal the mixture in a bag or container. Put it in the trash. NOTE: This sheet is a summary. It may not cover all possible information. If you have questions about this medicine, talk to your doctor, pharmacist, or health care provider.  2024 Elsevier/Gold Standard (2021-10-19 00:00:00)Carbamazepine  Extended-Release Capsules What is this medication? CARBAMAZEPINE  (kar ba MAZ e peen) prevents and controls seizures in people with epilepsy. It may also be used to treat nerve pain. It works by calming overactive nerves in your body. This medicine may be used for other purposes; ask your health care provider or pharmacist if you have questions. COMMON BRAND NAME(S): Carbatrol  What should I tell my care team before I take this medication? They need to know if you have any of these conditions: Asian  ancestry Bone marrow disease Glaucoma Heart disease Irregular heartbeat or rhythm Kidney disease Liver disease Low blood cell levels (white cells, red cells, or platelets) Mental health conditions Porphyria Suicidal thoughts, plans, or attempt by you or a family member An unusual or allergic reaction to carbamazepine , other medications, foods, dyes, or preservatives Pregnant or trying to get pregnant Breastfeeding How should I use this medication? Take this medication by mouth with a glass of water. Follow the directions on the prescription label. Do not cut, crush or chew this medication. Take this medication with or without food. The capsules can be opened and the beads sprinkled over food such as applesauce or other similar food product. Take your doses at regular intervals. Do not take your medication more often than directed. Do not stop taking except on your care team's advice. A special MedGuide will be given to you by the pharmacist with each prescription and refill. Be sure to read this information carefully each  time. Talk to your care team about the use of this medication in children. While this medication may be prescribed for selected conditions, precautions do apply. Overdosage: If you think you have taken too much of this medicine contact a poison control center or emergency room at once. NOTE: This medicine is only for you. Do not share this medicine with others. What if I miss a dose? If you miss a dose, take it as soon as you can. If it is almost time for your next dose, take only that dose. Do not take double or extra doses. What may interact with this medication? Do not take this medication with any of the following: Certain medications for HIV or AIDS that are given in combination with cobicistat Delavirdine MAOIs, such as Carbex, Eldepryl, Marplan, Nardil, and Parnate Nefazodone Oxcarbazepine This medication may also interact with the  following: Acetaminophen  Acetazolamide Barbiturate medications for inducing sleep or treating seizures, such as phenobarbital Certain antibiotics, such as clarithromycin, erythromycin, or troleandomycin Cimetidine Cyclosporine Danazol Dicumarol Doxycycline  Estrogen or progestin hormones Grapefruit juice Isoniazid, INH Levothyroxine and other thyroid hormones Lithium Loratadine Medications for angina or blood pressure Medications for cancer Medications for depression, anxiety, or other mental health conditions Medications for fungal infections, such as fluconazole, itraconazole, or ketoconazole Medications for HIV or AIDS Medications for seizures Medications that help you fall asleep Methadone Niacinamide Praziquantel Propoxyphene Rifampin or rifabutin Steroid medications, such as prednisone  or cortisone Theophylline Tramadol Warfarin This list may not describe all possible interactions. Give your health care provider a list of all the medicines, herbs, non-prescription drugs, or dietary supplements you use. Also tell them if you smoke, drink alcohol, or use illegal drugs. Some items may interact with your medicine. What should I watch for while using this medication? Visit your care team for regular checks on your progress. Do not change brands or dosage forms of this medication without discussing it with your care team. If you are taking this medication for epilepsy (seizures), do not stop taking it suddenly. This increases the risk of seizures. Wear a Arboriculturist or necklace. Carry an identification card with information about your condition, medications, and care team. This medication may cause serious skin reactions. They can happen weeks to months after starting the medication. Contact your care team right away if you notice fevers or flu-like symptoms with a rash. The rash may be red or purple and then turn into blisters or peeling of the skin. You may also notice a  red rash with swelling of the face, lips, or lymph nodes in your neck or under your arms. This medication may affect your coordination, reaction time, or judgment. Do not drive or operate machinery until you know how this medication affects you. Sit up or stand slowly to reduce the risk of dizzy or fainting spells. Drinking alcohol with this medication can increase the risk of these side effects. Estrogen and progestin hormones may not work as well while you are taking this medication. A barrier contraceptive, such as a condom or diaphragm, is recommended if you are using these hormones for contraception. Talk to your care team about effective forms of contraception. This medication can make you more sensitive to the sun. Keep out of the sun. If you cannot avoid being in the sun, wear protective clothing and sunscreen. Do not use sun lamps, tanning beds, or tanning booths. This medication may cause thoughts of suicide or depression. This includes sudden changes in mood, behaviors, or thoughts. These changes  can happen at any time but are more common in the beginning of treatment or after a change in dose. Call your care team right away if you experience these thoughts or worsening depression. Women who become pregnant while using this medication may enroll in the [place] Antiepileptic Drug Pregnancy Registry by calling [phone]. This registry collects information about the safety of antiepileptic medication use during pregnancy. This medication may cause a decrease in vitamin D  and folic acid. You should make sure that you get enough vitamins while you are taking this medication. Discuss the foods you eat and the vitamins you take with your care team. What side effects may I notice from receiving this medication? Side effects that you should report to your care team as soon as possible: Allergic reactions--skin rash, itching, hives, swelling of the face, lips, tongue, or throat Aplastic  anemia--unusual weakness or fatigue, dizziness, headache, trouble breathing, increased bleeding or bruising Change in vision Heart rhythm changes--fast or irregular heartbeat, dizziness, feeling faint or lightheaded, chest pain, trouble breathing Infection--fever, chills, cough, or sore throat Liver injury--right upper belly pain, loss of appetite, nausea, light-colored stool, dark yellow or brown urine, yellowing skin or eyes, unusual weakness or fatigue Low sodium level--muscle weakness, fatigue, dizziness, headache, confusion Rash, fever, and swollen lymph nodes Redness, blistering, peeling or loosening of the skin, including inside the mouth Thoughts of suicide or self-harm, worsening mood, feelings of depression Side effects that usually do not require medical attention (report to your care team if they continue or are bothersome): Dizziness Drowsiness Loss of balance or coordination Nausea Vomiting This list may not describe all possible side effects. Call your doctor for medical advice about side effects. You may report side effects to FDA at 1-800-FDA-1088. Where should I keep my medication? Keep out of reach of children. Store at room temperature between 15 and 30 degrees C (59 and 86 degrees F). Keep container tightly closed. Protect from moisture. Throw away any unused medication after the expiration date. NOTE: This sheet is a summary. It may not cover all possible information. If you have questions about this medicine, talk to your doctor, pharmacist, or health care provider.  2024 Elsevier/Gold Standard (2021-07-27 00:00:00)

## 2023-06-15 NOTE — Telephone Encounter (Signed)
 MRI trigeminal nerve CT neck    Increase carbatrol  to 200mg  twice a day. Then in a few days if tolerated and needed can increase to 300mg  twice a day Increase Gabapentin  300mg  three times a day as needed Baclofen  at bedtime I will give FMLA or short-term disability  If needs percocet Dr Tresia Fruit will prescribe or tramadol

## 2023-06-15 NOTE — Progress Notes (Signed)
 [facility]    Provider:  Dr Tresia Fruit Requesting Provider: Adolph Hoop, PA-C Primary Care Provider:  Annella Kief, NP  CC:  Trigeminal neuralgia  HPI:  [name] is a 48 y.o. male here as requested by Adolph Hoop, PA-C for acute urgent visit for trigeminal neuralgia, severe pain and acute onset. has Chronic migraine without aura with status migrainosus, not intractable; S/P cervical spinal fusion; Essential hypertension; Sleep apnea, obstructive; Situational anxiety; Gastroesophageal reflux disease; Seborrheic keratosis of scalp; Encounter for annual physical exam; Numerous skin moles; History of nephrolithiasis; Hypertriglyceridemia; Vitamin D  deficiency; Cholelithiasis without obstruction; Fatty liver; BMI 29.0-29.9,adult; Dry eyes, bilateral; Abdominal bloating; Abdominal pain; Dysphagia; and Nausea on their problem list.   A week ago pain in the right side of the face he was out of the office and had more localized cheek and jaw no injuries deep aching pain. Saw his endodontist and not dental. Amox and a steroid has not helped. Over the weekend worsening severe pain shooting from the jaw down the cheek and to the eye severe shooting pain brief then reprieve then again. Eating trigger. Temp trigger, not opening mouth. Hurts to swallow, hard to open jaw. Met the endodontist again on Sunday all day long on and off sto her in racks. Tearing of the eye no weakness. Went to urgent care no shingles but trigeminal neuralgia. 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 No tick bite. No rash. No lesions. No other focal neurologic deficits, associated symptoms, inciting events or modifiable factors.  Reviewed notes, labs and imaging from outside physicians, which showed:  Recent Results (from the past 2160 hours)  CBC with Differential/Platelet     Status: None   Collection Time: 05/12/23  2:43 PM  Result Value Ref Range   WBC 6.0 3.4 - 10.8 x10E3/uL   RBC  5.41 4.14 - 5.80 x10E6/uL   Hemoglobin 16.1 13.0 - 17.7 g/dL   Hematocrit 16.1 09.6 - 51.0 %   MCV 86 79 - 97 fL   MCH 29.8 26.6 - 33.0 pg   MCHC 34.5 31.5 - 35.7 g/dL   RDW 04.5 40.9 - 81.1 %   Platelets 344 150 - 450 x10E3/uL   Neutrophils 54 Not Estab. %   Lymphs 36 Not Estab. %   Monocytes 7 Not Estab. %   Eos 1 Not Estab. %   Basos 1 Not Estab. %   Neutrophils Absolute 3.3 1.4 - 7.0 x10E3/uL   Lymphocytes Absolute 2.2 0.7 - 3.1 x10E3/uL   Monocytes Absolute 0.4 0.1 - 0.9 x10E3/uL   EOS (ABSOLUTE) 0.1 0.0 - 0.4 x10E3/uL   Basophils Absolute 0.1 0.0 - 0.2 x10E3/uL   Immature Granulocytes 1 Not Estab. %   Immature Grans (Abs) 0.0 0.0 - 0.1 x10E3/uL  CMP14+EGFR     Status: None   Collection Time: 05/12/23  2:43 PM  Result Value Ref Range   Glucose 84 70 - 99 mg/dL   BUN 15 6 - 24 mg/dL   Creatinine, Ser 9.14 0.76 - 1.27 mg/dL   eGFR 782 >95 AO/ZHY/8.65   BUN/Creatinine Ratio 17 9 - 20   Sodium 138 134 - 144 mmol/L   Potassium 4.2 3.5 - 5.2 mmol/L   Chloride 99 96 - 106 mmol/L   CO2 21 20 - 29 mmol/L   Calcium  10.0 8.7 - 10.2 mg/dL   Total Protein 7.9 6.0 - 8.5 g/dL   Albumin 4.8 4.1 - 5.1 g/dL   Globulin, Total 3.1 1.5 -  4.5 g/dL   Bilirubin Total 0.6 0.0 - 1.2 mg/dL   Alkaline Phosphatase 80 44 - 121 IU/L   AST 17 0 - 40 IU/L   ALT 17 0 - 44 IU/L  Hemoglobin A1c     Status: None   Collection Time: 05/12/23  2:43 PM  Result Value Ref Range   Hgb A1c MFr Bld 5.4 4.8 - 5.6 %    Comment:          Prediabetes: 5.7 - 6.4          Diabetes: >6.4          Glycemic control for adults with diabetes: <7.0    Est. average glucose Bld gHb Est-mCnc 108 mg/dL  Lipid panel     Status: Abnormal   Collection Time: 05/12/23  2:43 PM  Result Value Ref Range   Cholesterol, Total 229 (H) 100 - 199 mg/dL   Triglycerides 191 (H) 0 - 149 mg/dL   HDL 30 (L) >47 mg/dL   VLDL Cholesterol Cal 75 (H) 5 - 40 mg/dL   LDL Chol Calc (NIH) 829 (H) 0 - 99 mg/dL   Chol/HDL Ratio 7.6 (H) 0.0 -  5.0 ratio    Comment:                                   T. Chol/HDL Ratio                                             Men  Women                               1/2 Avg.Risk  3.4    3.3                                   Avg.Risk  5.0    4.4                                2X Avg.Risk  9.6    7.1                                3X Avg.Risk 23.4   11.0   VITAMIN D  25 Hydroxy (Vit-D Deficiency, Fractures)     Status: Abnormal   Collection Time: 05/12/23  2:43 PM  Result Value Ref Range   Vit D, 25-Hydroxy 22.9 (L) 30.0 - 100.0 ng/mL    Comment: Vitamin D  deficiency has been defined by the Institute of Medicine and an Endocrine Society practice guideline as a level of serum 25-OH vitamin D  less than 20 ng/mL (1,2). The Endocrine Society went on to further define vitamin D  insufficiency as a level between 21 and 29 ng/mL (2). 1. IOM (Institute of Medicine). 2010. Dietary reference    intakes for calcium  and D. [place]: The    [employer]. 2. Holick MF, Binkley Merrill, Bischoff-Ferrari HA, et al.    Evaluation, treatment, and prevention of vitamin D     deficiency: an Endocrine Society clinical practice    guideline. JCEM.  2011 Jul; 96(7):1911-30.      CT head 04/05/2012:  showed No acute intracranial abnormalities including mass lesion or mass effect, hydrocephalus, extra-axial fluid collection, midline shift, hemorrhage, or acute infarction, large ischemic events (personally reviewed images)   Review of Systems: Patient complains of symptoms per HPI as well as the following symptoms migraine. Pertinent negatives and positives per HPI. All others negative.   Social History   Socioeconomic History   Marital status: Married    Spouse name: Bridgette Campus   Number of children: 3   Years of education: Not on file   Highest education level: Bachelor's degree (e.g., BA, AB, BS)  Occupational History    Employer: [employer]  Tobacco Use   Smoking status: Never   Smokeless  tobacco: Never  Vaping Use   Vaping status: Never Used  Substance and Sexual Activity   Alcohol use: No   Drug use: No   Sexual activity: Yes    Partners: Female  Other Topics Concern   Not on file  Social History Narrative   Caffeine -Pt drinks 2 cups of caffeine  daily.   Social Drivers of Corporate investment banker Strain: Low Risk  (05/10/2023)   Overall Financial Resource Strain (CARDIA)    Difficulty of Paying Living Expenses: Not very hard  Food Insecurity: No Food Insecurity (05/10/2023)   Hunger Vital Sign    Worried About Running Out of Food in the Last Year: Never true    Ran Out of Food in the Last Year: Never true  Transportation Needs: No Transportation Needs (05/10/2023)   PRAPARE - Administrator, Civil Service (Medical): No    Lack of Transportation (Non-Medical): No  Physical Activity: Insufficiently Active (05/10/2023)   Exercise Vital Sign    Days of Exercise per Week: 3 days    Minutes of Exercise per Session: 20 min  Stress: No Stress Concern Present (05/10/2023)   [employer] of Occupational Health - Occupational Stress Questionnaire    Feeling of Stress : Only a little  Social Connections: Unknown (05/10/2023)   Social Connection and Isolation Panel [NHANES]    Frequency of Communication with Friends and Family: More than three times a week    Frequency of Social Gatherings with Friends and Family: Patient declined    Attends Religious Services: Patient declined    Database administrator or Organizations: No    Attends Engineer, structural: Not on file    Marital Status: Married  Catering manager Violence: Not on file    Family History  Problem Relation Age of Onset   Headache Mother    Cancer Father        prostate and kidney cancer   Prostate cancer Father    Kidney cancer Father    Kidney Stones Sister    Migraines Sister     Past Medical History:  Diagnosis Date   Acute non-recurrent pansinusitis 03/08/2023    Allergy    Anxiety    Cervicalgia 04/17/2012   Chronic neck pain    Chronic pain    DDD (degenerative disc disease)    Herniated cervical disc    Hypertension    IBS (irritable bowel syndrome)    Lumbar pain with radiation down left leg 01/30/2014   Migraines    since 2002   Migraines    Motion sickness 03/08/2023   Nausea 10/30/2020   Renal calculi    Right upper quadrant pain 01/11/2022   Shingles    Sleep  apnea    does not wear c-pap   Stressful life events affecting family and household 05/30/2019   Subacute cough 09/06/2022   Xiphoid pain 05/30/2019    Patient Active Problem List   Diagnosis Date Noted   Abdominal bloating 05/12/2023   Abdominal pain 05/12/2023   Dysphagia 05/12/2023   Nausea 05/12/2023   Dry eyes, bilateral 04/13/2022   BMI 29.0-29.9,adult 01/12/2022   Cholelithiasis without obstruction 01/11/2022   Fatty liver 01/11/2022   Vitamin D  deficiency 04/07/2021   Numerous skin moles 04/05/2021   History of nephrolithiasis 04/05/2021   Seborrheic keratosis of scalp 10/30/2020   Encounter for annual physical exam 10/30/2020   Hypertriglyceridemia 10/30/2020   Situational anxiety 05/30/2019   Gastroesophageal reflux disease 05/30/2019   Essential hypertension 08/11/2016   Sleep apnea, obstructive 08/11/2016   S/P cervical spinal fusion 08/15/2013   Chronic migraine without aura with status migrainosus, not intractable 04/17/2012    Past Surgical History:  Procedure Laterality Date   ANTERIOR CERVICAL DECOMP/DISCECTOMY FUSION N/A 08/15/2013   Procedure: Anterior Cervical Four-Five/Five-Six/Six-Seven Decompression and Fusion;  Surgeon: Isadora Mar, MD;  Location: [facility];  Service: Neurosurgery;  Laterality: N/A;   LITHOTRIPSY     WISDOM TOOTH EXTRACTION      Current Outpatient Medications  Medication Sig Dispense Refill   baclofen  (LIORESAL ) 10 MG tablet Take 1 tablet (10 mg total) by mouth 3 (three) times daily as needed for muscle  spasms. May cause sedation may want to take it at bedtime to start 90 each 3   Budeson-Glycopyrrol-Formoterol  (BREZTRI  AEROSPHERE) 160-9-4.8 MCG/ACT AERO Inhale 2 puffs into the lungs in the morning and at bedtime. 21.4 g 1   carbamazepine  (CARBATROL ) 100 MG 12 hr capsule Take 1 capsule (100 mg total) by mouth 2 (two) times daily. 60 capsule 0   carbamazepine  (CARBATROL ) 200 MG 12 hr capsule Take 1 capsule (200 mg total) by mouth 2 (two) times daily. 60 capsule 6   fenofibrate  (TRICOR ) 48 MG tablet Take 0.5 tablets (24 mg total) by mouth daily. 90 tablet 1   fluticasone  (FLONASE ) 50 MCG/ACT nasal spray Place 2 sprays into both nostrils daily. 16 g 0   gabapentin  (NEURONTIN ) 100 MG capsule Take 1 capsule (100 mg total) by mouth 3 (three) times daily. 90 capsule 0   gabapentin  (NEURONTIN ) 300 MG capsule Take 1 capsule (300 mg total) by mouth 3 (three) times daily. 90 capsule 11   omega-3 acid ethyl esters (LOVAZA ) 1 g capsule Take 1 capsule (1 g total) by mouth 2 (two) times daily. 60 capsule 5   ondansetron  (ZOFRAN ) 8 MG tablet Take 8 mg by mouth every 8 (eight) hours as needed for nausea or vomiting.     oxyCODONE -acetaminophen  (PERCOCET) 10-325 MG tablet Take 1-2 tablets by mouth every 6 (six) hours as needed for pain. 56 tablet 0   propranolol  (INDERAL ) 40 MG tablet Take 0.5 tablets (20 mg total) by mouth daily. 90 tablet 1   Rimegepant Sulfate (NURTEC) 75 MG TBDP Take 1 tablet every other day for migraine prevention. 30 tablet 3   No current facility-administered medications for this visit.    Allergies as of 06/15/2023 - Review Complete 06/15/2023  Allergen Reaction Noted   Erythromycin Nausea And Vomiting 03/04/2010   Lisinopril   03/26/2020   Other  11/26/2022    Vitals: BP 126/77 (BP Location: Right Arm, Patient Position: Sitting, Cuff Size: Normal)   Pulse 76   Ht 5\' 10"  (1.778 m)   Wt 190  lb 3.2 oz (86.3 kg)   BMI 27.29 kg/m  Last Weight:  Wt Readings from Last 1 Encounters:   06/15/23 190 lb 3.2 oz (86.3 kg)   Last Height:   Ht Readings from Last 1 Encounters:  06/15/23 5\' 10"  (1.778 m)     Physical exam: Exam: Gen: NAD, conversant, well nourised, obese, well groomed                     CV: RRR, no MRG. No Carotid Bruits. No peripheral edema, warm, nontender Eyes: Conjunctivae clear without exudates or hemorrhage Pain on palpation of right-side face, preauricular and submandibular pain and possibly enlarged lymph nodes, pain on palpation of hyoid ligament  Neuro: Detailed Neurologic Exam  Speech:    Speech is normal; fluent and spontaneous with normal comprehension.  Cognition:    The patient is oriented to person, place, and time;     recent and remote memory intact;     language fluent;     normal attention, concentration,     fund of knowledge Cranial Nerves:    The pupils are equal, round, and reactive to light. The fundi are normal and spontaneous venous pulsations are present. Visual fields are full to finger confrontation. Extraocular movements are intact. Trigeminal sensation is intact and the muscles of mastication are normal. The face is symmetric. The palate elevates in the midline. Hearing intact. Voice is normal. Shoulder shrug is normal. The tongue has normal motion without fasciculations.   Coordination:    Normal finger to nose and heel to shin. Normal rapid alternating movements.   Gait:    Heel-toe and tandem gait are normal.   Motor Observation:    No asymmetry, no atrophy, and no involuntary movements noted. Tone:    Normal muscle tone.    Posture:    Posture is normal. normal erect    Strength:    Strength is V/V in the upper and lower limbs.      Sensation: intact to LT     Reflex Exam:  DTR's:    Deep tendon reflexes in the upper and lower extremities are symmetrical bilaterally.   Toes:    The toes are downgoing bilaterally.   Clonus:    Clonus is absent.    Assessment/Plan:  Acute onset severe facial  pain; Pain on palpation of right-side face, preauricular and submandibular pain and possibly enlarged lymph nodes, pain on palpation of hyoid ligament  MRI trigeminal nerve for vascular compression or compressive mass, H&N cancer or other to cause facial pain, trigeminal neuralgia CT neck for eval of dysphagia, H&N cancer with perineural spread, eagle syndrome  Increase carbatrol  to 200mg  twice a day. Then in a few days if tolerated and needed can increase to 300mg  twice a day (has 100mg  pills from prior prescription) Increase Gabapentin  300mg  three times a day as needed Baclofen  at bedtime I will give FMLA or short-term disability  If needs percocet Dr Tresia Fruit will prescribe or tramadol  Orders Placed This Encounter  Procedures   MR FACE/TRIGEMINAL WO/W CM   CT CERVICAL SPINE WO CONTRAST   MR BRAIN W WO CONTRAST   Meds ordered this encounter  Medications   carbamazepine  (CARBATROL ) 200 MG 12 hr capsule    Sig: Take 1 capsule (200 mg total) by mouth 2 (two) times daily.    Dispense:  60 capsule    Refill:  6   gabapentin  (NEURONTIN ) 300 MG capsule    Sig: Take 1 capsule (  300 mg total) by mouth 3 (three) times daily.    Dispense:  90 capsule    Refill:  11   baclofen  (LIORESAL ) 10 MG tablet    Sig: Take 1 tablet (10 mg total) by mouth 3 (three) times daily as needed for muscle spasms. May cause sedation may want to take it at bedtime to start    Dispense:  90 each    Refill:  3   oxyCODONE -acetaminophen  (PERCOCET) 10-325 MG tablet    Sig: Take 1-2 tablets by mouth every 6 (six) hours as needed for pain.    Dispense:  56 tablet    Refill:  0    Cc: Jaunita Messier,  Early, Adriane Albe, NP  Aldona Amel, MD  [facility] [address] [place], [place] [address]  Phone [phone] Fax [phone]  I spent over 60 minutes of face-to-face and non-face-to-face time with patient on the  1. Trigeminal neuralgia of right side of face   2.  Neuropathic trigeminal sensory nerve   3. Dysphagia, unspecified type   4. screen for Malignant peripheral nerve sheath tumor with perineural differentiation (HCC)   5. 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  This included previsit chart review, lab review, study review, order entry, electronic health record documentation, patient education on the different diagnostic and therapeutic options, counseling and coordination of care, risks and benefits of management, compliance, or risk factor reduction

## 2023-06-15 NOTE — Telephone Encounter (Signed)
 Received FMLA Forms from [employer], patient paid form fee at checkout. Bringing to P[facility] to review and complete

## 2023-06-15 NOTE — Telephone Encounter (Signed)
 Pt called  requesting the Pain medication he was offered today  at appt . Pt states he would like to move forward in getting a Rx  for pain medication .

## 2023-06-17 ENCOUNTER — Other Ambulatory Visit (HOSPITAL_BASED_OUTPATIENT_CLINIC_OR_DEPARTMENT_OTHER): Payer: Self-pay

## 2023-06-17 ENCOUNTER — Encounter: Payer: Self-pay | Admitting: Neurology

## 2023-06-17 MED ORDER — OXYCODONE-ACETAMINOPHEN 10-325 MG PO TABS
1.0000 | ORAL_TABLET | Freq: Four times a day (QID) | ORAL | 0 refills | Status: AC | PRN
  Filled 2023-06-17: qty 56, 7d supply, fill #0

## 2023-06-19 ENCOUNTER — Other Ambulatory Visit (HOSPITAL_BASED_OUTPATIENT_CLINIC_OR_DEPARTMENT_OTHER): Payer: Self-pay

## 2023-06-19 NOTE — Telephone Encounter (Signed)
 Sent him a Clinical cytogeneticist and ordered, fyi thanks

## 2023-06-19 NOTE — Telephone Encounter (Signed)
 Adding MRI brain to his appointment on June 10

## 2023-06-20 ENCOUNTER — Telehealth (INDEPENDENT_AMBULATORY_CARE_PROVIDER_SITE_OTHER): Admitting: Neurology

## 2023-06-20 DIAGNOSIS — G500 Trigeminal neuralgia: Secondary | ICD-10-CM | POA: Diagnosis not present

## 2023-06-20 NOTE — Progress Notes (Addendum)
 [facility]    Provider:  Dr Tresia Fruit Requesting Provider: Early, Adriane Albe, NP Primary Care Provider:  Annella Kief, NP  CC:  Trigeminal neuralgia  Virtual Visit via Video Note  I connected with [name] on 06/20/2023 at  7:30 AM EDT by a video enabled telemedicine application and verified that I am speaking with the correct person using two identifiers.  Location: Patient: home Provider: office   I discussed the limitations of evaluation and management by telemedicine and the availability of in person appointments. The patient expressed understanding and agreed to proceed.   Follow Up Instructions:    I discussed the assessment and treatment plan with the patient. The patient was provided an opportunity to ask questions and all were answered. The patient agreed with the plan and demonstrated an understanding of the instructions.   The patient was advised to call back or seek an in-person evaluation if the symptoms worsen or if the condition fails to improve as anticipated.  I provided 14 minutes of non-face-to-face time during this encounter.   Glory Larsen, MD   06/20/2023: Was having dizziness. Discussed Stop the gabapentin  and continue the carbatrol . Shooting pains much less. Go back to the carbatrol  100mg  once a day.  If the pain is still tolerable we can consider stopping or if the pain comes back go back to twice a day. Pending MRI brain along with brain trigeminal protocol.  Patient complains of symptoms per HPI as well as the following symptoms: none . Pertinent negatives and positives per HPI. All others negative   HPI:  [name] is a 48 y.o. male here as requested by Early, Adriane Albe, NP for acute urgent visit for trigeminal neuralgia, severe pain and acute onset. has Chronic migraine without aura with status migrainosus, not intractable; S/P cervical spinal fusion; Essential hypertension; Sleep apnea, obstructive; Situational anxiety;  Gastroesophageal reflux disease; Seborrheic keratosis of scalp; Encounter for annual physical exam; Numerous skin moles; History of nephrolithiasis; Hypertriglyceridemia; Vitamin D  deficiency; Cholelithiasis without obstruction; Fatty liver; BMI 29.0-29.9,adult; Dry eyes, bilateral; Abdominal bloating; Abdominal pain; Dysphagia; Nausea; and Trigeminal neuralgia on their problem list.   A week ago pain in the right side of the face he was out of the office and had more localized cheek and jaw no injuries deep aching pain. Saw his endodontist and not dental. Amox and a steroid has not helped. Over the weekend worsening severe pain shooting from the jaw down the cheek and to the eye severe shooting pain brief then reprieve then again. Eating trigger. Temp trigger, not opening mouth. Hurts to swallow, hard to open jaw. Met the endodontist again on Sunday all day long on and off sto her in racks. Tearing of the eye no weakness. Went to urgent care no shingles but trigeminal neuralgia. 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 No tick bite. No rash. No lesions. No other focal neurologic deficits, associated symptoms, inciting events or modifiable factors.  Reviewed notes, labs and imaging from outside physicians, which showed:  Recent Results (from the past 2160 hours)  CBC with Differential/Platelet     Status: None   Collection Time: 05/12/23  2:43 PM  Result Value Ref Range   WBC 6.0 3.4 - 10.8 x10E3/uL   RBC 5.41 4.14 - 5.80 x10E6/uL   Hemoglobin 16.1 13.0 - 17.7 g/dL   Hematocrit 16.1 09.6 - 51.0 %   MCV 86 79 - 97 fL   MCH 29.8 26.6 -  33.0 pg   MCHC 34.5 31.5 - 35.7 g/dL   RDW 16.1 09.6 - 04.5 %   Platelets 344 150 - 450 x10E3/uL   Neutrophils 54 Not Estab. %   Lymphs 36 Not Estab. %   Monocytes 7 Not Estab. %   Eos 1 Not Estab. %   Basos 1 Not Estab. %   Neutrophils Absolute 3.3 1.4 - 7.0 x10E3/uL   Lymphocytes Absolute 2.2 0.7 - 3.1 x10E3/uL   Monocytes Absolute  0.4 0.1 - 0.9 x10E3/uL   EOS (ABSOLUTE) 0.1 0.0 - 0.4 x10E3/uL   Basophils Absolute 0.1 0.0 - 0.2 x10E3/uL   Immature Granulocytes 1 Not Estab. %   Immature Grans (Abs) 0.0 0.0 - 0.1 x10E3/uL  CMP14+EGFR     Status: None   Collection Time: 05/12/23  2:43 PM  Result Value Ref Range   Glucose 84 70 - 99 mg/dL   BUN 15 6 - 24 mg/dL   Creatinine, Ser 4.09 0.76 - 1.27 mg/dL   eGFR 811 >91 YN/WGN/5.62   BUN/Creatinine Ratio 17 9 - 20   Sodium 138 134 - 144 mmol/L   Potassium 4.2 3.5 - 5.2 mmol/L   Chloride 99 96 - 106 mmol/L   CO2 21 20 - 29 mmol/L   Calcium  10.0 8.7 - 10.2 mg/dL   Total Protein 7.9 6.0 - 8.5 g/dL   Albumin 4.8 4.1 - 5.1 g/dL   Globulin, Total 3.1 1.5 - 4.5 g/dL   Bilirubin Total 0.6 0.0 - 1.2 mg/dL   Alkaline Phosphatase 80 44 - 121 IU/L   AST 17 0 - 40 IU/L   ALT 17 0 - 44 IU/L  Hemoglobin A1c     Status: None   Collection Time: 05/12/23  2:43 PM  Result Value Ref Range   Hgb A1c MFr Bld 5.4 4.8 - 5.6 %    Comment:          Prediabetes: 5.7 - 6.4          Diabetes: >6.4          Glycemic control for adults with diabetes: <7.0    Est. average glucose Bld gHb Est-mCnc 108 mg/dL  Lipid panel     Status: Abnormal   Collection Time: 05/12/23  2:43 PM  Result Value Ref Range   Cholesterol, Total 229 (H) 100 - 199 mg/dL   Triglycerides 130 (H) 0 - 149 mg/dL   HDL 30 (L) >86 mg/dL   VLDL Cholesterol Cal 75 (H) 5 - 40 mg/dL   LDL Chol Calc (NIH) 578 (H) 0 - 99 mg/dL   Chol/HDL Ratio 7.6 (H) 0.0 - 5.0 ratio    Comment:                                   T. Chol/HDL Ratio                                             Men  Women                               1/2 Avg.Risk  3.4    3.3  Avg.Risk  5.0    4.4                                2X Avg.Risk  9.6    7.1                                3X Avg.Risk 23.4   11.0   VITAMIN D  25 Hydroxy (Vit-D Deficiency, Fractures)     Status: Abnormal   Collection Time: 05/12/23  2:43 PM  Result Value  Ref Range   Vit D, 25-Hydroxy 22.9 (L) 30.0 - 100.0 ng/mL    Comment: Vitamin D  deficiency has been defined by the Institute of Medicine and an Endocrine Society practice guideline as a level of serum 25-OH vitamin D  less than 20 ng/mL (1,2). The Endocrine Society went on to further define vitamin D  insufficiency as a level between 21 and 29 ng/mL (2). 1. IOM (Institute of Medicine). 2010. Dietary reference    intakes for calcium  and D. [place]: The    [employer]. 2. Holick MF, Binkley [place], Bischoff-Ferrari HA, et al.    Evaluation, treatment, and prevention of vitamin D     deficiency: an Endocrine Society clinical practice    guideline. JCEM. 2011 Jul; 96(7):1911-30.      CT head 04/05/2012:  showed No acute intracranial abnormalities including mass lesion or mass effect, hydrocephalus, extra-axial fluid collection, midline shift, hemorrhage, or acute infarction, large ischemic events (personally reviewed images)   Review of Systems: Patient complains of symptoms per HPI as well as the following symptoms migraine. Pertinent negatives and positives per HPI. All others negative.   Social History   Socioeconomic History   Marital status: Married    Spouse name: Bridgette Campus   Number of children: 3   Years of education: Not on file   Highest education level: Bachelor's degree (e.g., BA, AB, BS)  Occupational History    Employer: [employer]  Tobacco Use   Smoking status: Never   Smokeless tobacco: Never  Vaping Use   Vaping status: Never Used  Substance and Sexual Activity   Alcohol use: No   Drug use: No   Sexual activity: Yes    Partners: Female  Other Topics Concern   Not on file  Social History Narrative   Caffeine -Pt drinks 2 cups of caffeine  daily.   Social Drivers of Corporate investment banker Strain: Low Risk  (05/10/2023)   Overall Financial Resource Strain (CARDIA)    Difficulty of Paying Living Expenses: Not very hard  Food Insecurity: No  Food Insecurity (05/10/2023)   Hunger Vital Sign    Worried About Running Out of Food in the Last Year: Never true    Ran Out of Food in the Last Year: Never true  Transportation Needs: No Transportation Needs (05/10/2023)   PRAPARE - Administrator, Civil Service (Medical): No    Lack of Transportation (Non-Medical): No  Physical Activity: Insufficiently Active (05/10/2023)   Exercise Vital Sign    Days of Exercise per Week: 3 days    Minutes of Exercise per Session: 20 min  Stress: No Stress Concern Present (05/10/2023)   [employer] of Occupational Health - Occupational Stress Questionnaire    Feeling of Stress : Only a little  Social Connections: Unknown (05/10/2023)   Social Connection and Isolation Panel [NHANES]  Frequency of Communication with Friends and Family: More than three times a week    Frequency of Social Gatherings with Friends and Family: Patient declined    Attends Religious Services: Patient declined    Database administrator or Organizations: No    Attends Engineer, structural: Not on file    Marital Status: Married  Catering manager Violence: Not on file    Family History  Problem Relation Age of Onset   Headache Mother    Cancer Father        prostate and kidney cancer   Prostate cancer Father    Kidney cancer Father    Kidney Stones Sister    Migraines Sister     Past Medical History:  Diagnosis Date   Acute non-recurrent pansinusitis 03/08/2023   Allergy    Anxiety    Cervicalgia 04/17/2012   Chronic neck pain    Chronic pain    DDD (degenerative disc disease)    Herniated cervical disc    Hypertension    IBS (irritable bowel syndrome)    Lumbar pain with radiation down left leg 01/30/2014   Migraines    since 2002   Migraines    Motion sickness 03/08/2023   Nausea 10/30/2020   Renal calculi    Right upper quadrant pain 01/11/2022   Shingles    Sleep apnea    does not wear c-pap   Stressful life events  affecting family and household 05/30/2019   Subacute cough 09/06/2022   Xiphoid pain 05/30/2019    Patient Active Problem List   Diagnosis Date Noted   Trigeminal neuralgia 06/22/2023   Abdominal bloating 05/12/2023   Abdominal pain 05/12/2023   Dysphagia 05/12/2023   Nausea 05/12/2023   Dry eyes, bilateral 04/13/2022   BMI 29.0-29.9,adult 01/12/2022   Cholelithiasis without obstruction 01/11/2022   Fatty liver 01/11/2022   Vitamin D  deficiency 04/07/2021   Numerous skin moles 04/05/2021   History of nephrolithiasis 04/05/2021   Seborrheic keratosis of scalp 10/30/2020   Encounter for annual physical exam 10/30/2020   Hypertriglyceridemia 10/30/2020   Situational anxiety 05/30/2019   Gastroesophageal reflux disease 05/30/2019   Essential hypertension 08/11/2016   Sleep apnea, obstructive 08/11/2016   S/P cervical spinal fusion 08/15/2013   Chronic migraine without aura with status migrainosus, not intractable 04/17/2012    Past Surgical History:  Procedure Laterality Date   ANTERIOR CERVICAL DECOMP/DISCECTOMY FUSION N/A 08/15/2013   Procedure: Anterior Cervical Four-Five/Five-Six/Six-Seven Decompression and Fusion;  Surgeon: Isadora Mar, MD;  Location: [facility];  Service: Neurosurgery;  Laterality: N/A;   LITHOTRIPSY     WISDOM TOOTH EXTRACTION      Current Outpatient Medications  Medication Sig Dispense Refill   baclofen  (LIORESAL ) 10 MG tablet Take 1 tablet (10 mg total) by mouth 3 (three) times daily as needed for muscle spasms. May cause sedation may want to take it at bedtime to start 90 each 3   Budeson-Glycopyrrol-Formoterol  (BREZTRI  AEROSPHERE) 160-9-4.8 MCG/ACT AERO Inhale 2 puffs into the lungs in the morning and at bedtime. 21.4 g 1   carbamazepine  (CARBATROL ) 100 MG 12 hr capsule Take 1 capsule (100 mg total) by mouth 2 (two) times daily. 60 capsule 0   carbamazepine  (CARBATROL ) 200 MG 12 hr capsule Take 1 capsule (200 mg total) by mouth 2 (two) times  daily. 60 capsule 6   fenofibrate  (TRICOR ) 48 MG tablet Take 0.5 tablets (24 mg total) by mouth daily. 90 tablet 1   fluticasone  (FLONASE ) 50  MCG/ACT nasal spray Place 2 sprays into both nostrils daily. 16 g 0   gabapentin  (NEURONTIN ) 100 MG capsule Take 1 capsule (100 mg total) by mouth 3 (three) times daily. 90 capsule 0   gabapentin  (NEURONTIN ) 300 MG capsule Take 1 capsule (300 mg total) by mouth 3 (three) times daily. 90 capsule 11   omega-3 acid ethyl esters (LOVAZA ) 1 g capsule Take 1 capsule (1 g total) by mouth 2 (two) times daily. 60 capsule 5   ondansetron  (ZOFRAN ) 8 MG tablet Take 8 mg by mouth every 8 (eight) hours as needed for nausea or vomiting.     oxyCODONE -acetaminophen  (PERCOCET) 10-325 MG tablet Take 1-2 tablets by mouth every 6 (six) hours as needed for pain. 56 tablet 0   propranolol  (INDERAL ) 40 MG tablet Take 0.5 tablets (20 mg total) by mouth daily. 90 tablet 1   Rimegepant Sulfate (NURTEC) 75 MG TBDP Take 1 tablet every other day for migraine prevention. 30 tablet 3   No current facility-administered medications for this visit.    Allergies as of 06/20/2023 - Review Complete 06/17/2023  Allergen Reaction Noted   Erythromycin Nausea And Vomiting 03/04/2010   Lisinopril   03/26/2020   Other  11/26/2022    Vitals: There were no vitals taken for this visit. Last Weight:  Wt Readings from Last 1 Encounters:  06/15/23 190 lb 3.2 oz (86.3 kg)   Last Height:   Ht Readings from Last 1 Encounters:  06/15/23 5\' 10"  (1.778 m)    Physical exam: Exam: Gen: NAD, conversant      CV: No palpitations or chest pain or SOB. VS: Breathing at a normal rate. Weight appears within normal limits. Not febrile. Eyes: Conjunctivae clear without exudates or hemorrhage  Neuro: Detailed Neurologic Exam  Speech:    Speech is normal; fluent and spontaneous with normal comprehension.  Cognition:    The patient is oriented to person, place, and time;     recent and remote memory  intact;     language fluent;     normal attention, concentration, fund of knowledge Cranial Nerves:    The pupils are equal, round, and reactive to light. Visual fields are full Extraocular movements are intact.  The face is symmetric with normal sensation. The palate elevates in the midline. Hearing intact. Voice is normal. Shoulder shrug is normal. The tongue has normal motion without fasciculations.   Coordination: normal  Gait:    No abnormalities noted or reported  Motor Observation:   no involuntary movements noted. Tone:    Appears normal  Posture:    Posture is normal. normal erect    Strength:    Strength is anti-gravity and symmetric in the upper and lower limbs.      Sensation: intact to LT, no reports of numbness or tingling or paresthesias         Assessment/Plan:  Acute onset severe facial pain; Pain on palpation of right-side face, preauricular and submandibular pain and possibly enlarged lymph nodes, pain on palpation of hyoid ligament  MRI trigeminal nerve for vascular compression or compressive mass, H&N cancer or other to cause facial pain, trigeminal neuralgia CT neck for eval of dysphagia, H&N cancer with perineural spread, eagle syndrome Have been working on the CPT codes with him to hep him see the codes and call his insurance  Increased carbatrol  to 200mg  twice a day. And increase Gabapentin  300mg  three times a day as needed but felt dizzy. Is better on Carbatrol  100mg  alone now so  resume that and can go to bid if worsens and no further dizziness Baclofen  at bedtime I will give FMLA or short-term disability  If needs percocet Dr Tresia Fruit will prescribe or tramadol temporarily  Cc: Early, Adriane Albe, NP,  Early, Adriane Albe, NP  Aldona Amel, MD  [facility] [address] [place], [place] [address]  Phone [phone] Fax [phone]

## 2023-06-21 ENCOUNTER — Telehealth: Payer: Self-pay | Admitting: *Deleted

## 2023-06-21 NOTE — Telephone Encounter (Signed)
FMLA form completed, signed, and sent to medical records for processing.  ?

## 2023-06-22 ENCOUNTER — Encounter: Payer: Self-pay | Admitting: Neurology

## 2023-06-22 DIAGNOSIS — G500 Trigeminal neuralgia: Secondary | ICD-10-CM | POA: Insufficient documentation

## 2023-06-22 NOTE — Telephone Encounter (Signed)
 Received completed form back from clinical staff. Form faxed. Copy and original given back to medical records.

## 2023-06-23 ENCOUNTER — Ambulatory Visit (HOSPITAL_BASED_OUTPATIENT_CLINIC_OR_DEPARTMENT_OTHER)
Admission: RE | Admit: 2023-06-23 | Discharge: 2023-06-23 | Disposition: A | Discharge status: Home / Self Care | Payer: Self-pay | Source: Ambulatory Visit | Attending: Cardiovascular Disease | Admitting: Cardiovascular Disease

## 2023-06-23 DIAGNOSIS — Z7689 Persons encountering health services in other specified circumstances: Secondary | ICD-10-CM | POA: Insufficient documentation

## 2023-06-25 ENCOUNTER — Ambulatory Visit: Payer: Self-pay | Admitting: Cardiovascular Disease

## 2023-06-25 DIAGNOSIS — I10 Essential (primary) hypertension: Secondary | ICD-10-CM

## 2023-06-25 DIAGNOSIS — K760 Fatty (change of) liver, not elsewhere classified: Secondary | ICD-10-CM

## 2023-06-25 DIAGNOSIS — Z79899 Other long term (current) drug therapy: Secondary | ICD-10-CM

## 2023-06-26 ENCOUNTER — Encounter: Payer: Self-pay | Admitting: Nurse Practitioner

## 2023-06-27 ENCOUNTER — Ambulatory Visit

## 2023-06-27 ENCOUNTER — Other Ambulatory Visit: Payer: Self-pay

## 2023-06-27 ENCOUNTER — Other Ambulatory Visit (HOSPITAL_BASED_OUTPATIENT_CLINIC_OR_DEPARTMENT_OTHER): Payer: Self-pay

## 2023-06-27 DIAGNOSIS — G500 Trigeminal neuralgia: Secondary | ICD-10-CM | POA: Diagnosis not present

## 2023-06-27 DIAGNOSIS — C479 Malignant neoplasm of peripheral nerves and autonomic nervous system, unspecified: Secondary | ICD-10-CM

## 2023-06-27 DIAGNOSIS — I10 Essential (primary) hypertension: Secondary | ICD-10-CM

## 2023-06-27 DIAGNOSIS — K760 Fatty (change of) liver, not elsewhere classified: Secondary | ICD-10-CM

## 2023-06-27 DIAGNOSIS — R1310 Dysphagia, unspecified: Secondary | ICD-10-CM

## 2023-06-27 DIAGNOSIS — G509 Disorder of trigeminal nerve, unspecified: Secondary | ICD-10-CM

## 2023-06-27 DIAGNOSIS — Z79899 Other long term (current) drug therapy: Secondary | ICD-10-CM

## 2023-06-27 MED ORDER — GADOBENATE DIMEGLUMINE 529 MG/ML IV SOLN
18.0000 mL | Freq: Once | INTRAVENOUS | Status: AC | PRN
  Administered 2023-06-27: 18 mL via INTRAVENOUS

## 2023-06-27 MED ORDER — ROSUVASTATIN CALCIUM 10 MG PO TABS
10.0000 mg | ORAL_TABLET | Freq: Every day | ORAL | 3 refills | Status: DC
  Filled 2023-06-27: qty 90, 90d supply, fill #0

## 2023-06-27 NOTE — Addendum Note (Signed)
 Addended by: Markeita Alicia N on: 06/27/2023 10:59 AM   Modules accepted: Orders

## 2023-06-29 ENCOUNTER — Other Ambulatory Visit (HOSPITAL_COMMUNITY): Payer: Self-pay

## 2023-06-29 ENCOUNTER — Ambulatory Visit: Payer: Self-pay | Admitting: Neurology

## 2023-06-29 ENCOUNTER — Other Ambulatory Visit: Payer: Self-pay

## 2023-07-10 ENCOUNTER — Telehealth: Payer: Self-pay | Admitting: Neurology

## 2023-07-10 ENCOUNTER — Other Ambulatory Visit: Payer: Self-pay | Admitting: Neurology

## 2023-07-10 DIAGNOSIS — G500 Trigeminal neuralgia: Secondary | ICD-10-CM

## 2023-07-10 NOTE — Telephone Encounter (Signed)
 Please see the MyChart message reply(ies) for my assessment and plan.    This patient gave consent for this Medical Advice Message and is aware that it may result in a bill to [employer], as well as the possibility of receiving a bill for a co-payment or deductible. They are an established patient, but are not seeking medical advice exclusively about a problem treated during an in person or video visit in the last seven days. I did not recommend an in person or video visit within seven days of my reply.    I spent a total of 11 minutes cumulative time within 7 days through [employer].  Onetha KATHEE Epp, MD

## 2023-07-10 NOTE — Telephone Encounter (Signed)
 Reerral for Neurosurgery Faxed to [facility]   [facility]  Phone# [phone] opt #4 Fax # [phone]

## 2023-07-12 ENCOUNTER — Encounter: Payer: Self-pay | Admitting: Neurology

## 2023-07-14 ENCOUNTER — Ambulatory Visit: Admitting: Cardiovascular Disease

## 2023-07-14 ENCOUNTER — Ambulatory Visit
Admission: RE | Admit: 2023-07-14 | Discharge: 2023-07-14 | Disposition: A | Discharge status: Home / Self Care | Source: Ambulatory Visit | Attending: Neurology | Admitting: Neurology

## 2023-07-14 DIAGNOSIS — M542 Cervicalgia: Secondary | ICD-10-CM | POA: Diagnosis not present

## 2023-07-14 DIAGNOSIS — G500 Trigeminal neuralgia: Secondary | ICD-10-CM

## 2023-07-14 DIAGNOSIS — G509 Disorder of trigeminal nerve, unspecified: Secondary | ICD-10-CM

## 2023-07-14 DIAGNOSIS — C479 Malignant neoplasm of peripheral nerves and autonomic nervous system, unspecified: Secondary | ICD-10-CM

## 2023-07-14 DIAGNOSIS — Z981 Arthrodesis status: Secondary | ICD-10-CM | POA: Diagnosis not present

## 2023-07-14 DIAGNOSIS — R1310 Dysphagia, unspecified: Secondary | ICD-10-CM

## 2023-08-08 ENCOUNTER — Ambulatory Visit: Admitting: Neurology

## 2023-08-09 ENCOUNTER — Other Ambulatory Visit (HOSPITAL_COMMUNITY): Payer: Self-pay

## 2023-08-09 ENCOUNTER — Other Ambulatory Visit: Payer: Self-pay | Admitting: Nurse Practitioner

## 2023-08-09 ENCOUNTER — Encounter: Payer: Self-pay | Admitting: Nurse Practitioner

## 2023-08-09 DIAGNOSIS — K219 Gastro-esophageal reflux disease without esophagitis: Secondary | ICD-10-CM

## 2023-08-09 MED ORDER — ONDANSETRON HCL 8 MG PO TABS
8.0000 mg | ORAL_TABLET | Freq: Three times a day (TID) | ORAL | 2 refills | Status: DC | PRN
Start: 2023-08-09 — End: 2023-09-29
  Filled 2023-08-09: qty 20, 7d supply, fill #0

## 2023-08-10 ENCOUNTER — Other Ambulatory Visit: Payer: Self-pay

## 2023-08-25 ENCOUNTER — Other Ambulatory Visit (HOSPITAL_COMMUNITY): Payer: Self-pay

## 2023-08-29 ENCOUNTER — Telehealth: Payer: Self-pay

## 2023-08-29 ENCOUNTER — Other Ambulatory Visit: Payer: Self-pay

## 2023-08-29 ENCOUNTER — Encounter: Payer: Self-pay | Admitting: Nurse Practitioner

## 2023-08-29 ENCOUNTER — Other Ambulatory Visit (HOSPITAL_COMMUNITY): Payer: Self-pay

## 2023-08-29 DIAGNOSIS — L821 Other seborrheic keratosis: Secondary | ICD-10-CM

## 2023-08-29 DIAGNOSIS — D229 Melanocytic nevi, unspecified: Secondary | ICD-10-CM

## 2023-08-29 NOTE — Telephone Encounter (Signed)
 Pharmacy Patient Advocate Encounter   Received notification from CoverMyMeds that prior authorization for Nurtec 75MG  dispersible tablets  is required/requested.   Insurance verification completed.   The patient is insured through [facility] .   Per test claim: PA required; PA submitted to above mentioned insurance via Latent Key/confirmation #/EOC [id number]   Status is pending

## 2023-08-29 NOTE — Telephone Encounter (Signed)
 Pharmacy Patient Advocate Encounter  Received notification from [facility] that Prior Authorization for Nurtec 75MG  dispersible tablets has been APPROVED from 08/29/2023 to 08/28/2024. Ran test claim, Copay is $0.00. This test claim was processed through [facility]- copay amounts may vary at other pharmacies due to pharmacy/plan contracts, or as the patient moves through the different stages of their insurance plan.   PA #/Case ID/Reference #: [id number]

## 2023-09-07 DIAGNOSIS — G4733 Obstructive sleep apnea (adult) (pediatric): Secondary | ICD-10-CM | POA: Diagnosis not present

## 2023-09-14 ENCOUNTER — Other Ambulatory Visit (HOSPITAL_COMMUNITY): Payer: Self-pay

## 2023-09-14 ENCOUNTER — Other Ambulatory Visit (HOSPITAL_BASED_OUTPATIENT_CLINIC_OR_DEPARTMENT_OTHER): Payer: Self-pay | Admitting: Nurse Practitioner

## 2023-09-14 ENCOUNTER — Other Ambulatory Visit: Payer: Self-pay

## 2023-09-14 DIAGNOSIS — E781 Pure hyperglyceridemia: Secondary | ICD-10-CM

## 2023-09-14 MED ORDER — FENOFIBRATE 48 MG PO TABS
24.0000 mg | ORAL_TABLET | Freq: Every day | ORAL | 1 refills | Status: DC
  Filled 2023-09-14: qty 45, 90d supply, fill #0

## 2023-09-29 ENCOUNTER — Ambulatory Visit (INDEPENDENT_AMBULATORY_CARE_PROVIDER_SITE_OTHER): Admitting: Family

## 2023-09-29 ENCOUNTER — Encounter (HOSPITAL_BASED_OUTPATIENT_CLINIC_OR_DEPARTMENT_OTHER): Payer: Self-pay | Admitting: Family

## 2023-09-29 ENCOUNTER — Other Ambulatory Visit (HOSPITAL_BASED_OUTPATIENT_CLINIC_OR_DEPARTMENT_OTHER): Payer: Self-pay

## 2023-09-29 VITALS — BP 122/90 | HR 82 | Ht 70.0 in | Wt 188.8 lb

## 2023-09-29 DIAGNOSIS — E785 Hyperlipidemia, unspecified: Secondary | ICD-10-CM | POA: Diagnosis not present

## 2023-09-29 DIAGNOSIS — E781 Pure hyperglyceridemia: Secondary | ICD-10-CM

## 2023-09-29 DIAGNOSIS — I2510 Atherosclerotic heart disease of native coronary artery without angina pectoris: Secondary | ICD-10-CM

## 2023-09-29 MED ORDER — OMEGA-3-ACID ETHYL ESTERS 1 G PO CAPS
2.0000 g | ORAL_CAPSULE | Freq: Two times a day (BID) | ORAL | 5 refills | Status: DC
Start: 2023-09-29 — End: 2023-11-09
  Filled 2023-09-29: qty 120, 30d supply, fill #0

## 2023-09-29 NOTE — Patient Instructions (Addendum)
 Medication Instructions:   CHANGE Lovaza  For one week take 1 tablet twice daily  The following week, increase to 2 tablets in the morning and 1 tablet in the evening The 3rd week, increase 2 tablets to twice daily  *If you need a refill on your cardiac medications before your next appointment, please call your pharmacy*  Lab Work: Your physician recommends that you return for lab work in one month: fasting lipid panel, direct LDL, lipoprotein a  If you have labs (blood work) drawn today and your tests are completely normal, you will receive your results only by: MyChart Message (if you have MyChart) OR A paper copy in the mail If you have any lab test that is abnormal or we need to change your treatment, we will call you to review the results.  Follow-Up: At [facility], you and your health needs are our priority.  As part of our continuing mission to provide you with exceptional heart care, our providers are all part of one team.  This team includes your primary Cardiologist (physician) and Advanced Practice Providers or APPs (Physician Assistants and Nurse Practitioners) who all work together to provide you with the care you need, when you need it.  Your next appointment:   You have been referred to Dr. Mona in [facility] AND In 6 months with Dr. Raford  We recommend signing up for the patient portal called MyChart.  Sign up information is provided on this After Visit Summary.  MyChart is used to connect with patients for Virtual Visits (Telemedicine).  Patients are able to view lab/test results, encounter notes, upcoming appointments, etc.  Non-urgent messages can be sent to your provider as well.   To learn more about what you can do with MyChart, go to [email or web address].

## 2023-09-29 NOTE — Progress Notes (Unsigned)
  Cardiology Office Note   Date:  09/29/2023  ID:  Charley, Miske 1975/02/10, MRN [id number] PCP: Oris Camie BRAVO, NP  [facility] Providers Cardiologist:  None { Click to update primary MD,subspecialty MD or APP then REFRESH:1}    History of Present Illness [name] is a 48 y.o. male ***  06/23/23 calcium  score 149 placing him in 93rd percentile.   Fenofeibrate Lovaza  Crestor  10   Reports feeling inflammation upper abdomen to lower chest. No clear triggers. He does try to avoid high acidic or spicy foods.  Has cut out sodas. He has switched to decaf drinking one or two 8 oz cups per day. Occurs more often at rest.   Exercising using medicine ball at home and also walking with his dogs. Plays volleyball with his daughter.   Was hesitant on statin. Now has been takign ROsuvastatin  10mg  for one month.   Follows with neadache for headache  With whole tablet of fenofibrate  noted stomach  side effects. This did improve with changing the dose.   Notes with Lovaza  he most often takes one tablet.   ETC2  ROS: Please see the history of present illness.    All other systems reviewed and are negative.   Studies Reviewed      *** Risk Assessment/Calculations {Does this patient have ATRIAL FIBRILLATION?[phone]} The patient's 1st BP is elevated (>139/89)*** Repeat BP and {Click to enter a 2nd BP Refresh Note  :1}       Physical Exam VS:  BP (!) 122/90   Pulse 82   Ht 5' 10 (1.778 m)   Wt 188 lb 12.8 oz (85.6 kg)   SpO2 96%   BMI 27.09 kg/m        Wt Readings from Last 3 Encounters:  09/29/23 188 lb 12.8 oz (85.6 kg)  06/15/23 190 lb 3.2 oz (86.3 kg)  05/12/23 190 lb 6.4 oz (86.4 kg)    GEN: Well nourished, well developed in no acute distress NECK: No JVD; No carotid bruits CARDIAC: ***RRR, no murmurs, rubs, gallops RESPIRATORY:  Clear to auscultation without rales, wheezing or rhonchi  ABDOMEN: Soft, non-tender, non-distended EXTREMITIES:  No  edema; No deformity   ASSESSMENT AND PLAN ***    {Are you ordering a CV Procedure (e.g. stress test, cath, DCCV, TEE, etc)?   Press F2        :[id number]}  Dispo: ***  Signed, Reche GORMAN Finder, NP

## 2023-10-01 ENCOUNTER — Encounter (HOSPITAL_BASED_OUTPATIENT_CLINIC_OR_DEPARTMENT_OTHER): Payer: Self-pay | Admitting: Family

## 2023-10-19 ENCOUNTER — Emergency Department (HOSPITAL_BASED_OUTPATIENT_CLINIC_OR_DEPARTMENT_OTHER)
Admission: EM | Admit: 2023-10-19 | Discharge: 2023-10-19 | Disposition: A | Discharge status: Home / Self Care | Attending: Emergency Medicine | Admitting: Emergency Medicine

## 2023-10-19 ENCOUNTER — Encounter (HOSPITAL_BASED_OUTPATIENT_CLINIC_OR_DEPARTMENT_OTHER): Payer: Self-pay

## 2023-10-19 ENCOUNTER — Emergency Department (HOSPITAL_BASED_OUTPATIENT_CLINIC_OR_DEPARTMENT_OTHER): Admitting: Radiology

## 2023-10-19 ENCOUNTER — Other Ambulatory Visit: Payer: Self-pay

## 2023-10-19 ENCOUNTER — Emergency Department (HOSPITAL_BASED_OUTPATIENT_CLINIC_OR_DEPARTMENT_OTHER)

## 2023-10-19 DIAGNOSIS — M542 Cervicalgia: Secondary | ICD-10-CM | POA: Diagnosis not present

## 2023-10-19 DIAGNOSIS — S4991XA Unspecified injury of right shoulder and upper arm, initial encounter: Secondary | ICD-10-CM | POA: Diagnosis not present

## 2023-10-19 DIAGNOSIS — M25511 Pain in right shoulder: Secondary | ICD-10-CM | POA: Diagnosis not present

## 2023-10-19 DIAGNOSIS — R590 Localized enlarged lymph nodes: Secondary | ICD-10-CM

## 2023-10-19 DIAGNOSIS — S0990XA Unspecified injury of head, initial encounter: Secondary | ICD-10-CM | POA: Diagnosis not present

## 2023-10-19 DIAGNOSIS — S8991XA Unspecified injury of right lower leg, initial encounter: Secondary | ICD-10-CM | POA: Diagnosis not present

## 2023-10-19 DIAGNOSIS — S46911A Strain of unspecified muscle, fascia and tendon at shoulder and upper arm level, right arm, initial encounter: Secondary | ICD-10-CM

## 2023-10-19 DIAGNOSIS — S299XXA Unspecified injury of thorax, initial encounter: Secondary | ICD-10-CM | POA: Diagnosis not present

## 2023-10-19 DIAGNOSIS — Z981 Arthrodesis status: Secondary | ICD-10-CM | POA: Diagnosis not present

## 2023-10-19 DIAGNOSIS — S3993XA Unspecified injury of pelvis, initial encounter: Secondary | ICD-10-CM | POA: Diagnosis not present

## 2023-10-19 DIAGNOSIS — S39012A Strain of muscle, fascia and tendon of lower back, initial encounter: Secondary | ICD-10-CM

## 2023-10-19 DIAGNOSIS — S199XXA Unspecified injury of neck, initial encounter: Secondary | ICD-10-CM | POA: Diagnosis not present

## 2023-10-19 DIAGNOSIS — W28XXXA Contact with powered lawn mower, initial encounter: Secondary | ICD-10-CM | POA: Diagnosis not present

## 2023-10-19 DIAGNOSIS — S3991XA Unspecified injury of abdomen, initial encounter: Secondary | ICD-10-CM | POA: Diagnosis not present

## 2023-10-19 DIAGNOSIS — M25561 Pain in right knee: Secondary | ICD-10-CM | POA: Diagnosis not present

## 2023-10-19 DIAGNOSIS — M545 Low back pain, unspecified: Secondary | ICD-10-CM | POA: Insufficient documentation

## 2023-10-19 DIAGNOSIS — S161XXA Strain of muscle, fascia and tendon at neck level, initial encounter: Secondary | ICD-10-CM

## 2023-10-19 DIAGNOSIS — R1030 Lower abdominal pain, unspecified: Secondary | ICD-10-CM | POA: Diagnosis not present

## 2023-10-19 DIAGNOSIS — S86911A Strain of unspecified muscle(s) and tendon(s) at lower leg level, right leg, initial encounter: Secondary | ICD-10-CM

## 2023-10-19 LAB — CBC WITH DIFFERENTIAL/PLATELET
Abs Immature Granulocytes: 0.02 K/uL (ref 0.00–0.07)
Basophils Absolute: 0.1 K/uL (ref 0.0–0.1)
Basophils Relative: 1 %
Eosinophils Absolute: 0.1 K/uL (ref 0.0–0.5)
Eosinophils Relative: 1 %
HCT: 40.4 % (ref 39.0–52.0)
Hemoglobin: 14 g/dL (ref 13.0–17.0)
Immature Granulocytes: 0 %
Lymphocytes Relative: 31 %
Lymphs Abs: 1.9 K/uL (ref 0.7–4.0)
MCH: 29.8 pg (ref 26.0–34.0)
MCHC: 34.7 g/dL (ref 30.0–36.0)
MCV: 86 fL (ref 80.0–100.0)
Monocytes Absolute: 0.4 K/uL (ref 0.1–1.0)
Monocytes Relative: 7 %
Neutro Abs: 3.6 K/uL (ref 1.7–7.7)
Neutrophils Relative %: 60 %
Platelets: 263 K/uL (ref 150–400)
RBC: 4.7 MIL/uL (ref 4.22–5.81)
RDW: 11.9 % (ref 11.5–15.5)
WBC: 6 K/uL (ref 4.0–10.5)
nRBC: 0 % (ref 0.0–0.2)

## 2023-10-19 LAB — BASIC METABOLIC PANEL WITH GFR
Anion gap: 12 (ref 5–15)
BUN: 15 mg/dL (ref 6–20)
CO2: 25 mmol/L (ref 22–32)
Calcium: 9.9 mg/dL (ref 8.9–10.3)
Chloride: 103 mmol/L (ref 98–111)
Creatinine, Ser: 0.88 mg/dL (ref 0.61–1.24)
GFR, Estimated: >60 mL/min (ref >60)
Glucose, Bld: 95 mg/dL (ref 70–99)
Potassium: 4 mmol/L (ref 3.5–5.1)
Sodium: 140 mmol/L (ref 135–145)

## 2023-10-19 MED ORDER — IOHEXOL 300 MG/ML  SOLN
100.0000 mL | Freq: Once | INTRAMUSCULAR | Status: AC | PRN
  Administered 2023-10-19: 100 mL via INTRAVENOUS

## 2023-10-19 MED ORDER — HYDROMORPHONE HCL 1 MG/ML IJ SOLN
1.0000 mg | Freq: Once | INTRAMUSCULAR | Status: AC
  Administered 2023-10-19: 1 mg via INTRAVENOUS
  Filled 2023-10-19: qty 1

## 2023-10-19 MED ORDER — ONDANSETRON 4 MG PO TBDP
4.0000 mg | ORAL_TABLET | Freq: Once | ORAL | Status: AC
  Administered 2023-10-19: 4 mg via ORAL
  Filled 2023-10-19: qty 1

## 2023-10-19 MED ORDER — SODIUM CHLORIDE 0.9 % IV BOLUS
1000.0000 mL | Freq: Once | INTRAVENOUS | Status: AC
  Administered 2023-10-19: 1000 mL via INTRAVENOUS

## 2023-10-19 NOTE — Discharge Instructions (Signed)
 Workup here today very reassuring no evidence of any acute injuries.  But would expect you to be very sore and stiff.  CT head neck chest abdomen pelvis without any findings.  On the left side there was a lymph node in the neck area that was borderline enlarged.  Can follow-up with your regular doctor regarding that.  Would recommend taking your pain medicine you have at home.  Work note provided if needed.  Would rest for the next few days.  Expect to be very sore.

## 2023-10-19 NOTE — ED Triage Notes (Signed)
 Pt reports riding lawn mower when it flipped over and landed on hi x2 hours ago. Pt report R shoulder pain, R knee pain, groin pain. Pt denies any LOC.
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
## 2023-10-20 ENCOUNTER — Encounter: Payer: Self-pay | Admitting: Nurse Practitioner

## 2023-10-22 ENCOUNTER — Encounter (HOSPITAL_BASED_OUTPATIENT_CLINIC_OR_DEPARTMENT_OTHER): Payer: Self-pay

## 2023-10-25 LAB — LIPID PANEL
Chol/HDL Ratio: 4.4 ratio (ref 0.0–5.0)
Cholesterol, Total: 149 mg/dL (ref 100–199)
HDL: 34 mg/dL — ABNORMAL LOW (ref >39)
LDL Chol Calc (NIH): 81 mg/dL (ref 0–99)
Triglycerides: 200 mg/dL — ABNORMAL HIGH (ref 0–149)
VLDL Cholesterol Cal: 34 mg/dL (ref 5–40)

## 2023-10-25 LAB — LDL CHOLESTEROL, DIRECT: LDL Direct: 82 mg/dL (ref 0–99)

## 2023-10-25 LAB — LIPOPROTEIN A (LPA): Lipoprotein (a): <8.4 nmol/L (ref <75.0)

## 2023-10-25 LAB — ALT: ALT: 20 IU/L (ref 0–44)

## 2023-10-26 ENCOUNTER — Ambulatory Visit (HOSPITAL_BASED_OUTPATIENT_CLINIC_OR_DEPARTMENT_OTHER): Payer: Self-pay | Admitting: Family

## 2023-10-26 MED ORDER — ROSUVASTATIN CALCIUM 20 MG PO TABS: 20.0000 mg | ORAL_TABLET | Freq: Every day | ORAL | 1 refills | Status: DC

## 2023-10-26 NOTE — Telephone Encounter (Signed)
-----   Message from Reche GORMAN Finder sent at 10/26/2023  1:40 PM EDT ----- Normal liver enzymes.  Lipoprotein a with no evidence of familial hyperlipidemia.  LDL (bad cholesterol) of 81 which is not yet at goal of less than 70.  Triglycerides are 200 which is improving from  previous but not yet at goal <150.   Recommend increase Rosuvastatin  to 20mg  daily.  Stop Fenofibrate .   Recommend follow up with Dr. Mona as scheduled.  ----- Message ----- From: Rebecka Memos Lab Results In Sent: 10/25/2023   6:38 AM EDT To: Reche GORMAN Finder, NP

## 2023-10-26 NOTE — Telephone Encounter (Signed)
 Left message for the pt to call back for results.  Results were also sent to the pts active mychart account to review by Reche Finder, NP.   Will go ahead and send increased Crestor  20 mg daily to his pharmacy on file and let the pt know this via mychart message. Will also endorse to him to STOP his fenofibrate  and follow-up with Dr. Mona as scheduled.

## 2023-10-28 ENCOUNTER — Encounter: Payer: Self-pay | Admitting: Nurse Practitioner

## 2023-11-01 ENCOUNTER — Telehealth (INDEPENDENT_AMBULATORY_CARE_PROVIDER_SITE_OTHER): Admitting: Nurse Practitioner

## 2023-11-01 ENCOUNTER — Encounter: Payer: Self-pay | Admitting: Nurse Practitioner

## 2023-11-01 VITALS — Wt 192.0 lb

## 2023-11-01 DIAGNOSIS — E782 Mixed hyperlipidemia: Secondary | ICD-10-CM | POA: Diagnosis not present

## 2023-11-01 DIAGNOSIS — R5383 Other fatigue: Secondary | ICD-10-CM

## 2023-11-01 DIAGNOSIS — K760 Fatty (change of) liver, not elsewhere classified: Secondary | ICD-10-CM | POA: Diagnosis not present

## 2023-11-01 DIAGNOSIS — W28XXXA Contact with powered lawn mower, initial encounter: Secondary | ICD-10-CM

## 2023-11-01 DIAGNOSIS — E781 Pure hyperglyceridemia: Secondary | ICD-10-CM

## 2023-11-01 DIAGNOSIS — R590 Localized enlarged lymph nodes: Secondary | ICD-10-CM | POA: Diagnosis not present

## 2023-11-01 NOTE — Assessment & Plan Note (Signed)
 Inquired about liver cleanses for fatty liver disease. No specific cleanses or treatments recommended at this time. He may consider asking gastroenterology. Advised that lifestyle modifications are the mainstay of management.
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
## 2023-11-01 NOTE — Assessment & Plan Note (Signed)
 Cholesterol levels have improved but are not at target levels. Cardiologist increased statin dose to 20 mg due to low HDL cholesterol, increasing cardiovascular risk. Statin prevents cholesterol buildup and helps clear existing cholesterol. He expressed concern about increasing the dose too quickly. Suggested gradual increase in dosage to monitor tolerance, given hereditary risk factors and low HDL levels. - Continue current statin therapy at 10 mg - Increase statin dose to 20 mg once weekly, then gradually increase frequency if tolerated - Monitor for any side effects and adjust dosage as needed
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
## 2023-11-03 ENCOUNTER — Other Ambulatory Visit

## 2023-11-03 ENCOUNTER — Ambulatory Visit
Admission: RE | Admit: 2023-11-03 | Discharge: 2023-11-03 | Disposition: A | Discharge status: Home / Self Care | Source: Ambulatory Visit | Attending: Nurse Practitioner | Admitting: Nurse Practitioner

## 2023-11-03 DIAGNOSIS — R591 Generalized enlarged lymph nodes: Secondary | ICD-10-CM | POA: Diagnosis not present

## 2023-11-03 DIAGNOSIS — R590 Localized enlarged lymph nodes: Secondary | ICD-10-CM

## 2023-11-06 ENCOUNTER — Ambulatory Visit: Payer: Self-pay | Admitting: Nurse Practitioner

## 2023-11-06 ENCOUNTER — Encounter: Payer: Self-pay | Admitting: Nurse Practitioner

## 2023-11-06 DIAGNOSIS — R590 Localized enlarged lymph nodes: Secondary | ICD-10-CM

## 2023-11-07 DIAGNOSIS — R590 Localized enlarged lymph nodes: Secondary | ICD-10-CM | POA: Diagnosis not present

## 2023-11-07 DIAGNOSIS — M542 Cervicalgia: Secondary | ICD-10-CM | POA: Diagnosis not present

## 2023-11-08 ENCOUNTER — Inpatient Hospital Stay: Admission: RE | Admit: 2023-11-08 | Discharge: 2023-11-08 | Attending: Nurse Practitioner

## 2023-11-08 ENCOUNTER — Encounter: Payer: Self-pay | Admitting: Nurse Practitioner

## 2023-11-08 DIAGNOSIS — R590 Localized enlarged lymph nodes: Secondary | ICD-10-CM | POA: Diagnosis not present

## 2023-11-08 DIAGNOSIS — J351 Hypertrophy of tonsils: Secondary | ICD-10-CM | POA: Diagnosis not present

## 2023-11-08 MED ORDER — IOPAMIDOL (ISOVUE-300) INJECTION 61%
75.0000 mL | Freq: Once | INTRAVENOUS | Status: AC | PRN
  Administered 2023-11-08: 75 mL via INTRAVENOUS

## 2023-11-09 ENCOUNTER — Ambulatory Visit (INDEPENDENT_AMBULATORY_CARE_PROVIDER_SITE_OTHER): Admitting: Internal Medicine

## 2023-11-09 ENCOUNTER — Encounter: Payer: Self-pay | Admitting: Nurse Practitioner

## 2023-11-09 VITALS — BP 110/70 | HR 76 | Ht 70.0 in | Wt 188.0 lb

## 2023-11-09 DIAGNOSIS — I2510 Atherosclerotic heart disease of native coronary artery without angina pectoris: Secondary | ICD-10-CM | POA: Diagnosis not present

## 2023-11-09 DIAGNOSIS — E781 Pure hyperglyceridemia: Secondary | ICD-10-CM

## 2023-11-09 DIAGNOSIS — E785 Hyperlipidemia, unspecified: Secondary | ICD-10-CM

## 2023-11-09 DIAGNOSIS — Z79899 Other long term (current) drug therapy: Secondary | ICD-10-CM

## 2023-11-09 MED ORDER — ICOSAPENT ETHYL 1 G PO CAPS
2.0000 g | ORAL_CAPSULE | Freq: Two times a day (BID) | ORAL | 5 refills | Status: AC
  Filled 2024-01-26: qty 360, 90d supply, fill #0

## 2023-11-09 MED ORDER — ROSUVASTATIN CALCIUM 20 MG PO TABS
20.0000 mg | ORAL_TABLET | Freq: Every day | ORAL | 1 refills | Status: AC
  Filled 2023-12-11: qty 90, 90d supply, fill #0

## 2023-11-09 NOTE — Progress Notes (Signed)
 LIPID CLINIC CONSULT NOTE  Chief Complaint:  Manage dyslipidemia  Primary Care Physician: Juan Camie BRAVO, NP  Primary Cardiologist:  Juan Scarce, MD  HPI:  [name] is a 48 y.o. male who is being seen today for the evaluation of dyslipidemia at the request of Juan Reche RAMAN, NP. This is a pleasant 48 year old male kindly referred for evaluation and management of dyslipidemia.  He was seen recently in September for history of nonobstructive coronary disease, hyperlipidemia, GERD, hypertension, migraine and obstructive sleep apnea.  He was previously followed by [name] and underwent a calcium  score in June 2025 which was 149 and 93rd percentile.  He had been on rosuvastatin  10 mg daily however repeat lipid testing showed a history of high triglycerides with total cholesterol 229, triglycerides 421, HDL 30 and LDL 124.  He had been on Lovaza  which was increased to 2 g twice daily.  In addition he apparently was tolerating a low-dose fenofibrate  but this was stopped.  His rosuvastatin  was increased to 20 mg daily.  He was then referred to see us .  He had recent repeat labs which do so some improvement in his cholesterol total 149, triglycerides 200, HDL 34 and LDL 81.  This does remain above a target LDL less than 70.  PMHx:  Past Medical History:  Diagnosis Date   Acute non-recurrent pansinusitis 03/08/2023   Allergy    Anxiety    Cervicalgia 04/17/2012   Chronic neck pain    Chronic pain    DDD (degenerative disc disease)    GERD (gastroesophageal reflux disease)    Herniated cervical disc    Hypertension    IBS (irritable bowel syndrome)    Lumbar pain with radiation down left leg 01/30/2014   Migraines    since 2002   Migraines    Motion sickness 03/08/2023   Nausea 10/30/2020   Renal calculi    Right upper quadrant pain 01/11/2022   Shingles    Sleep apnea    does not wear c-pap   Stressful life events affecting family and household 05/30/2019    Subacute cough 09/06/2022   Xiphoid pain 05/30/2019    Past Surgical History:  Procedure Laterality Date   ANTERIOR CERVICAL DECOMP/DISCECTOMY FUSION N/A 08/15/2013   Procedure: Anterior Cervical Four-Five/Five-Six/Six-Seven Decompression and Fusion;  Surgeon: [name] Molt, MD;  Location: [facility];  Service: Neurosurgery;  Laterality: N/A;   LITHOTRIPSY     SPINE SURGERY     Cervical Fusion   WISDOM TOOTH EXTRACTION      FAMHx:  Family History  Problem Relation Age of Onset   Headache Mother    Cancer Father        prostate and kidney cancer   Prostate cancer Father    Kidney cancer Father    Hypertension Father    Stroke Father    Kidney Stones Sister    Migraines Sister    Cancer Sister     SOCHx:   reports that he has never smoked. He has never used smokeless tobacco. He reports that he does not drink alcohol and does not use drugs.  ALLERGIES:  Allergies  Allergen Reactions   Erythromycin Nausea And Vomiting   Lisinopril      Headaches, concern about kidney stones. No allergy to the medication.   Other     PECANS, WALNUTS    ROS: Pertinent items noted in HPI and remainder of comprehensive ROS otherwise negative.  HOME MEDS: Current Outpatient Medications on  File Prior to Visit  Medication Sig Dispense Refill   Budeson-Glycopyrrol-Formoterol  (BREZTRI  AEROSPHERE) 160-9-4.8 MCG/ACT AERO Inhale 2 puffs into the lungs in the morning and at bedtime. 21.4 g 1   fluticasone  (FLONASE ) 50 MCG/ACT nasal spray Place 2 sprays into both nostrils daily. 16 g 0   omega-3 acid ethyl esters (LOVAZA ) 1 g capsule Take 2 capsules (2 g total) by mouth 2 (two) times daily. 120 capsule 5   ondansetron  (ZOFRAN ) 8 MG tablet Take 8 mg by mouth every 8 (eight) hours as needed for nausea or vomiting.     oxyCODONE -acetaminophen  (PERCOCET) 10-325 MG tablet Take 1-2 tablets by mouth every 6 (six) hours as needed for pain. 56 tablet 0   propranolol  (INDERAL ) 40 MG tablet Take 0.5 tablets  (20 mg total) by mouth daily. 90 tablet 1   Rimegepant Sulfate (NURTEC) 75 MG TBDP Take 1 tablet every other day for migraine prevention. 30 tablet 3   rosuvastatin  (CRESTOR ) 20 MG tablet Take 1 tablet (20 mg total) by mouth daily. 90 tablet 1   No current facility-administered medications on file prior to visit.    LABS/IMAGING: No results found for this or any previous visit (from the past 48 hours). No results found.  LIPID PANEL:    Component Value Date/Time   CHOL 149 10/24/2023 0814   TRIG 200 (H) 10/24/2023 0814   HDL 34 (L) 10/24/2023 0814   CHOLHDL 4.4 10/24/2023 0814   CHOLHDL 5.9 07/24/2008 0720   VLDL 61 (H) 07/24/2008 0720   LDLCALC 81 10/24/2023 0814   LDLDIRECT 82 10/24/2023 0814    Lipoprotein (a)  Date/Time Value Ref Range Status  10/24/2023 08:14 AM <8.4 <75.0 nmol/L Final    Comment:    **Results verified by repeat testing** Note:  Values greater than or equal to 75.0 nmol/L may        indicate an independent risk factor for CHD,        but must be evaluated with caution when applied        to non-Caucasian populations due to the        influence of genetic factors on Lp(a) across        ethnicities.      WEIGHTS: Wt Readings from Last 3 Encounters:  11/09/23 188 lb (85.3 kg)  11/01/23 192 lb (87.1 kg)  10/19/23 190 lb (86.2 kg)    VITALS: BP 110/70 (BP Location: Left Arm, Patient Position: Sitting, Cuff Size: Normal)   Pulse 76   Ht 5' 10 (1.778 m)   Wt 188 lb (85.3 kg)   SpO2 96%   BMI 26.98 kg/m   EXAM: Deferred  EKG: Deferred  ASSESSMENT: Dyslipidemia, goal LDL less than 70 CAC score of 149, 93rd percentile Aortic atherosclerosis Negative LP(a)  PLAN: 1.   [name] has a dyslipidemia but remains above a target LDL less than 70.  Given his coronary artery disease I think it would be more guideline directed to switch him to Vascepa instead of Lovaza .  Would recommend continuing rosuvastatin  20 mg daily although he tells me he  has only been taking it once a week at the request of his primary care provider who said to work up to the dose slowly.  I would not advise this and recommend increasing the dose from 10 to 20 mg which is a common dose change.  If he has any side effects he should reach out to me but otherwise we will plan repeat lab work  in about 3 months.  Thanks again for the kind referral.  Vinie KYM Maxcy, MD, [facility], FNLA, FACP  [place]  [facility]  Medical Director of the [facility]  [facility] Clinic Diplomate of the American Board of Clinical Lipidology Attending Cardiologist  Direct Dial: [phone]  Fax: [phone]  Website:  [email or web address]  Vinie JAYSON Maxcy 11/09/2023, 3:38 PM

## 2023-11-09 NOTE — Patient Instructions (Signed)
 Medication Instructions:   STOP TAKING LOVAZA   START TAKING VASCEPA 2 GRAMS BY MOUTH TWICE DAILY  PLEASE CONTINUE TAKING ROSUVASTATIN  (CRESTOR ) 20 MG BY MOUTH DAILY  *If you need a refill on your cardiac medications before your next appointment, please call your pharmacy*  Lab Work:  RETURN FOR LAB WORK IN 3 MONTHS (AROUND 02/09/24) HERE AT [facility] ON 3RD FLOOR--NMR LIPOPROFILE--PLEASE COME FASTING TO THIS LAB APPOINTMENT  If you have labs (blood work) drawn today and your tests are completely normal, you will receive your results only by: MyChart Message (if you have MyChart) OR A paper copy in the mail If you have any lab test that is abnormal or we need to change your treatment, we will call you to review the results.    Follow-Up:  AS NEEDED WITH DR. HILTY

## 2023-11-10 ENCOUNTER — Other Ambulatory Visit (HOSPITAL_COMMUNITY): Payer: Self-pay

## 2023-11-13 ENCOUNTER — Encounter: Payer: Self-pay | Admitting: Nurse Practitioner

## 2023-11-14 ENCOUNTER — Other Ambulatory Visit (HOSPITAL_COMMUNITY): Payer: Self-pay

## 2023-11-15 ENCOUNTER — Ambulatory Visit: Payer: Self-pay | Admitting: Nurse Practitioner

## 2023-11-28 ENCOUNTER — Encounter: Payer: Self-pay | Admitting: Nurse Practitioner

## 2023-12-08 ENCOUNTER — Other Ambulatory Visit (HOSPITAL_BASED_OUTPATIENT_CLINIC_OR_DEPARTMENT_OTHER): Payer: Self-pay

## 2023-12-08 MED ORDER — FLUZONE 0.5 ML IM SUSY
0.5000 mL | PREFILLED_SYRINGE | Freq: Once | INTRAMUSCULAR | 0 refills | Status: AC
  Filled 2023-12-08: qty 0.5, 1d supply, fill #0

## 2023-12-09 DIAGNOSIS — G4733 Obstructive sleep apnea (adult) (pediatric): Secondary | ICD-10-CM | POA: Diagnosis not present

## 2023-12-11 ENCOUNTER — Other Ambulatory Visit: Payer: Self-pay

## 2023-12-11 ENCOUNTER — Other Ambulatory Visit (HOSPITAL_BASED_OUTPATIENT_CLINIC_OR_DEPARTMENT_OTHER): Payer: Self-pay

## 2023-12-11 ENCOUNTER — Other Ambulatory Visit (HOSPITAL_COMMUNITY): Payer: Self-pay

## 2023-12-12 ENCOUNTER — Other Ambulatory Visit (HOSPITAL_BASED_OUTPATIENT_CLINIC_OR_DEPARTMENT_OTHER): Payer: Self-pay

## 2023-12-18 ENCOUNTER — Other Ambulatory Visit (HOSPITAL_BASED_OUTPATIENT_CLINIC_OR_DEPARTMENT_OTHER): Payer: Self-pay

## 2023-12-18 ENCOUNTER — Other Ambulatory Visit (HOSPITAL_COMMUNITY): Payer: Self-pay

## 2023-12-20 ENCOUNTER — Other Ambulatory Visit (HOSPITAL_BASED_OUTPATIENT_CLINIC_OR_DEPARTMENT_OTHER): Payer: Self-pay

## 2023-12-20 DIAGNOSIS — L218 Other seborrheic dermatitis: Secondary | ICD-10-CM | POA: Diagnosis not present

## 2023-12-20 DIAGNOSIS — L923 Foreign body granuloma of the skin and subcutaneous tissue: Secondary | ICD-10-CM | POA: Diagnosis not present

## 2023-12-20 DIAGNOSIS — L821 Other seborrheic keratosis: Secondary | ICD-10-CM | POA: Diagnosis not present

## 2023-12-20 DIAGNOSIS — D1801 Hemangioma of skin and subcutaneous tissue: Secondary | ICD-10-CM | POA: Diagnosis not present

## 2023-12-20 DIAGNOSIS — D492 Neoplasm of unspecified behavior of bone, soft tissue, and skin: Secondary | ICD-10-CM | POA: Diagnosis not present

## 2023-12-20 DIAGNOSIS — L814 Other melanin hyperpigmentation: Secondary | ICD-10-CM | POA: Diagnosis not present

## 2023-12-20 MED ORDER — KETOCONAZOLE 2 % EX CREA
TOPICAL_CREAM | CUTANEOUS | 3 refills | Status: AC
  Filled 2023-12-20: qty 15, 30d supply, fill #0

## 2023-12-29 ENCOUNTER — Other Ambulatory Visit: Payer: Self-pay

## 2023-12-29 ENCOUNTER — Other Ambulatory Visit (HOSPITAL_COMMUNITY): Payer: Self-pay

## 2024-01-24 ENCOUNTER — Other Ambulatory Visit: Payer: Self-pay

## 2024-01-24 ENCOUNTER — Other Ambulatory Visit (HOSPITAL_COMMUNITY): Payer: Self-pay

## 2024-01-24 ENCOUNTER — Other Ambulatory Visit (HOSPITAL_BASED_OUTPATIENT_CLINIC_OR_DEPARTMENT_OTHER): Payer: Self-pay | Admitting: Nurse Practitioner

## 2024-01-24 DIAGNOSIS — I10 Essential (primary) hypertension: Secondary | ICD-10-CM

## 2024-01-24 MED ORDER — PROPRANOLOL HCL 40 MG PO TABS
20.0000 mg | ORAL_TABLET | Freq: Every day | ORAL | 1 refills | Status: AC
  Filled 2024-01-24: qty 45, 90d supply, fill #0

## 2024-01-26 ENCOUNTER — Other Ambulatory Visit: Payer: Self-pay

## 2024-01-26 ENCOUNTER — Other Ambulatory Visit (HOSPITAL_COMMUNITY): Payer: Self-pay

## 2024-01-26 MED ORDER — PROPRANOLOL HCL 40 MG PO TABS: 20.0000 mg | ORAL_TABLET | Freq: Every day | ORAL | 1 refills | Status: AC

## 2024-01-30 ENCOUNTER — Other Ambulatory Visit (HOSPITAL_COMMUNITY): Payer: Self-pay

## 2024-04-10 ENCOUNTER — Ambulatory Visit: Admitting: Dermatology

## 2024-05-16 ENCOUNTER — Encounter: Payer: Self-pay | Admitting: Nurse Practitioner
